# Patient Record
Sex: Female | Born: 1950 | Race: White | Hispanic: No | Marital: Married | Smoking: Never smoker
Health system: Southern US, Community
[De-identification: ages and names within clinical notes are randomized; demographics above are authoritative.]

## PROBLEM LIST (undated history)

## (undated) DIAGNOSIS — M5416 Radiculopathy, lumbar region: Secondary | ICD-10-CM

## (undated) DIAGNOSIS — M810 Age-related osteoporosis without current pathological fracture: Secondary | ICD-10-CM

## (undated) DIAGNOSIS — N390 Urinary tract infection, site not specified: Secondary | ICD-10-CM

## (undated) DIAGNOSIS — T84498A Other mechanical complication of other internal orthopedic devices, implants and grafts, initial encounter: Secondary | ICD-10-CM

## (undated) DIAGNOSIS — E785 Hyperlipidemia, unspecified: Secondary | ICD-10-CM

## (undated) DIAGNOSIS — Z889 Allergy status to unspecified drugs, medicaments and biological substances status: Secondary | ICD-10-CM

## (undated) DIAGNOSIS — D414 Neoplasm of uncertain behavior of bladder: Secondary | ICD-10-CM

## (undated) DIAGNOSIS — Z8739 Personal history of other diseases of the musculoskeletal system and connective tissue: Secondary | ICD-10-CM

## (undated) DIAGNOSIS — N6019 Diffuse cystic mastopathy of unspecified breast: Secondary | ICD-10-CM

## (undated) DIAGNOSIS — E7800 Pure hypercholesterolemia, unspecified: Secondary | ICD-10-CM

## (undated) DIAGNOSIS — N1831 Chronic kidney disease, stage 3a: Secondary | ICD-10-CM

## (undated) DIAGNOSIS — M1612 Unilateral primary osteoarthritis, left hip: Secondary | ICD-10-CM

## (undated) DIAGNOSIS — I499 Cardiac arrhythmia, unspecified: Secondary | ICD-10-CM

## (undated) DIAGNOSIS — M1990 Unspecified osteoarthritis, unspecified site: Secondary | ICD-10-CM

## (undated) DIAGNOSIS — R7303 Prediabetes: Secondary | ICD-10-CM

## (undated) DIAGNOSIS — M51369 Other intervertebral disc degeneration, lumbar region without mention of lumbar back pain or lower extremity pain: Secondary | ICD-10-CM

## (undated) DIAGNOSIS — G20A1 Parkinson's disease without dyskinesia, without mention of fluctuations: Secondary | ICD-10-CM

## (undated) HISTORY — PX: BLADDER FULGURATION: SHX1237

## (undated) HISTORY — PX: TUBAL LIGATION: SHX77

## (undated) HISTORY — PX: DILATION AND CURETTAGE OF UTERUS: SHX78

## (undated) HISTORY — PX: COLONOSCOPY: SHX174

## (undated) HISTORY — PX: OVARIAN CYST REMOVAL: SHX89

## (undated) HISTORY — PX: TONSILLECTOMY AND ADENOIDECTOMY: SUR1326

## (undated) HISTORY — PX: ABDOMINAL HYSTERECTOMY: SHX81

---

## 1976-10-11 HISTORY — PX: TONSILLECTOMY AND ADENOIDECTOMY: SUR1326

## 1983-10-12 HISTORY — PX: OVARIAN CYST REMOVAL: SHX89

## 1996-10-11 HISTORY — PX: DILATION AND CURETTAGE OF UTERUS: SHX78

## 2005-09-21 ENCOUNTER — Ambulatory Visit: Payer: Self-pay | Admitting: Internal Medicine

## 2006-12-14 ENCOUNTER — Ambulatory Visit: Payer: Self-pay | Admitting: Internal Medicine

## 2008-05-09 ENCOUNTER — Ambulatory Visit: Payer: Self-pay | Admitting: Internal Medicine

## 2009-05-14 ENCOUNTER — Ambulatory Visit: Payer: Self-pay | Admitting: Internal Medicine

## 2010-05-19 ENCOUNTER — Ambulatory Visit: Payer: Self-pay | Admitting: Internal Medicine

## 2010-09-29 ENCOUNTER — Ambulatory Visit: Payer: Self-pay | Admitting: Pain Medicine

## 2010-10-07 ENCOUNTER — Ambulatory Visit: Payer: Self-pay | Admitting: Pain Medicine

## 2010-10-27 ENCOUNTER — Ambulatory Visit: Payer: Self-pay | Admitting: Pain Medicine

## 2010-11-09 ENCOUNTER — Ambulatory Visit: Payer: Self-pay | Admitting: Pain Medicine

## 2010-12-15 ENCOUNTER — Ambulatory Visit: Payer: Self-pay | Admitting: Pain Medicine

## 2010-12-23 ENCOUNTER — Ambulatory Visit: Payer: Self-pay | Admitting: Pain Medicine

## 2011-01-26 ENCOUNTER — Ambulatory Visit: Payer: Self-pay | Admitting: Pain Medicine

## 2011-02-23 ENCOUNTER — Ambulatory Visit: Payer: Self-pay | Admitting: Pain Medicine

## 2011-03-10 ENCOUNTER — Ambulatory Visit: Payer: Self-pay | Admitting: Pain Medicine

## 2011-04-20 ENCOUNTER — Ambulatory Visit: Payer: Self-pay | Admitting: Pain Medicine

## 2011-04-26 ENCOUNTER — Ambulatory Visit: Payer: Self-pay | Admitting: Pain Medicine

## 2011-05-20 ENCOUNTER — Ambulatory Visit: Payer: Self-pay | Admitting: Pain Medicine

## 2011-05-25 ENCOUNTER — Ambulatory Visit: Payer: Self-pay | Admitting: Internal Medicine

## 2011-06-22 ENCOUNTER — Ambulatory Visit: Payer: Self-pay | Admitting: Pain Medicine

## 2011-08-17 ENCOUNTER — Ambulatory Visit: Payer: Self-pay | Admitting: Pain Medicine

## 2011-10-19 ENCOUNTER — Ambulatory Visit: Payer: Self-pay | Admitting: Pain Medicine

## 2012-05-30 ENCOUNTER — Ambulatory Visit: Payer: Self-pay | Admitting: Internal Medicine

## 2013-01-01 ENCOUNTER — Ambulatory Visit: Payer: Self-pay | Admitting: Gastroenterology

## 2013-01-01 HISTORY — PX: COLONOSCOPY: SHX174

## 2013-12-11 ENCOUNTER — Ambulatory Visit: Payer: Self-pay | Admitting: Internal Medicine

## 2014-01-16 DIAGNOSIS — I493 Ventricular premature depolarization: Secondary | ICD-10-CM | POA: Insufficient documentation

## 2014-11-12 DIAGNOSIS — M810 Age-related osteoporosis without current pathological fracture: Secondary | ICD-10-CM | POA: Insufficient documentation

## 2014-11-12 DIAGNOSIS — E785 Hyperlipidemia, unspecified: Secondary | ICD-10-CM | POA: Insufficient documentation

## 2014-12-16 ENCOUNTER — Ambulatory Visit: Payer: Self-pay | Admitting: Internal Medicine

## 2015-12-22 DIAGNOSIS — Z0000 Encounter for general adult medical examination without abnormal findings: Secondary | ICD-10-CM | POA: Insufficient documentation

## 2015-12-29 ENCOUNTER — Other Ambulatory Visit: Payer: Self-pay | Admitting: Internal Medicine

## 2015-12-29 DIAGNOSIS — Z1231 Encounter for screening mammogram for malignant neoplasm of breast: Secondary | ICD-10-CM

## 2016-01-16 ENCOUNTER — Ambulatory Visit
Admission: RE | Admit: 2016-01-16 | Discharge: 2016-01-16 | Disposition: A | Discharge status: Home / Self Care | Payer: Managed Care, Other (non HMO) | Source: Ambulatory Visit | Attending: Internal Medicine | Admitting: Internal Medicine

## 2016-01-16 DIAGNOSIS — Z1231 Encounter for screening mammogram for malignant neoplasm of breast: Secondary | ICD-10-CM | POA: Diagnosis present

## 2017-01-04 ENCOUNTER — Other Ambulatory Visit: Payer: Self-pay | Admitting: Internal Medicine

## 2017-01-04 DIAGNOSIS — Z1231 Encounter for screening mammogram for malignant neoplasm of breast: Secondary | ICD-10-CM

## 2017-01-25 ENCOUNTER — Ambulatory Visit
Admission: RE | Admit: 2017-01-25 | Discharge: 2017-01-25 | Disposition: A | Discharge status: Home / Self Care | Payer: Managed Care, Other (non HMO) | Source: Ambulatory Visit | Attending: Internal Medicine | Admitting: Internal Medicine

## 2017-01-25 DIAGNOSIS — Z1231 Encounter for screening mammogram for malignant neoplasm of breast: Secondary | ICD-10-CM | POA: Diagnosis present

## 2018-03-16 ENCOUNTER — Other Ambulatory Visit: Payer: Self-pay | Admitting: Internal Medicine

## 2018-03-16 DIAGNOSIS — Z1231 Encounter for screening mammogram for malignant neoplasm of breast: Secondary | ICD-10-CM

## 2018-04-10 ENCOUNTER — Ambulatory Visit
Admission: RE | Admit: 2018-04-10 | Discharge: 2018-04-10 | Disposition: A | Discharge status: Home / Self Care | Payer: Medicare HMO | Source: Ambulatory Visit | Attending: Internal Medicine | Admitting: Internal Medicine

## 2018-04-10 DIAGNOSIS — Z1231 Encounter for screening mammogram for malignant neoplasm of breast: Secondary | ICD-10-CM | POA: Diagnosis present

## 2018-07-11 ENCOUNTER — Encounter: Payer: Self-pay | Admitting: *Deleted

## 2018-07-12 ENCOUNTER — Ambulatory Visit
Admission: RE | Admit: 2018-07-12 | Discharge: 2018-07-12 | Disposition: A | Discharge status: Home / Self Care | Payer: Medicare HMO | Source: Ambulatory Visit | Attending: Internal Medicine | Admitting: Internal Medicine

## 2018-07-12 ENCOUNTER — Ambulatory Visit: Payer: Medicare HMO | Admitting: Anesthesiology

## 2018-07-12 ENCOUNTER — Other Ambulatory Visit: Payer: Self-pay

## 2018-07-12 ENCOUNTER — Encounter
Admission: RE | Disposition: A | Discharge status: Home / Self Care | Payer: Self-pay | Source: Ambulatory Visit | Attending: Internal Medicine

## 2018-07-12 DIAGNOSIS — M810 Age-related osteoporosis without current pathological fracture: Secondary | ICD-10-CM | POA: Insufficient documentation

## 2018-07-12 DIAGNOSIS — Z800 Family history of malignant neoplasm of digestive organs: Secondary | ICD-10-CM | POA: Diagnosis not present

## 2018-07-12 DIAGNOSIS — E7800 Pure hypercholesterolemia, unspecified: Secondary | ICD-10-CM | POA: Diagnosis not present

## 2018-07-12 DIAGNOSIS — K621 Rectal polyp: Secondary | ICD-10-CM | POA: Insufficient documentation

## 2018-07-12 DIAGNOSIS — K640 First degree hemorrhoids: Secondary | ICD-10-CM | POA: Diagnosis not present

## 2018-07-12 DIAGNOSIS — Z1211 Encounter for screening for malignant neoplasm of colon: Secondary | ICD-10-CM | POA: Diagnosis not present

## 2018-07-12 HISTORY — DX: Neoplasm of uncertain behavior of bladder: D41.4

## 2018-07-12 HISTORY — DX: Urinary tract infection, site not specified: N39.0

## 2018-07-12 HISTORY — DX: Diffuse cystic mastopathy of unspecified breast: N60.19

## 2018-07-12 HISTORY — PX: COLONOSCOPY WITH PROPOFOL: SHX5780

## 2018-07-12 HISTORY — DX: Personal history of other diseases of the musculoskeletal system and connective tissue: Z87.39

## 2018-07-12 HISTORY — DX: Allergy status to unspecified drugs, medicaments and biological substances: Z88.9

## 2018-07-12 HISTORY — DX: Age-related osteoporosis without current pathological fracture: M81.0

## 2018-07-12 HISTORY — DX: Cardiac arrhythmia, unspecified: I49.9

## 2018-07-12 HISTORY — DX: Pure hypercholesterolemia, unspecified: E78.00

## 2018-07-12 SURGERY — COLONOSCOPY WITH PROPOFOL
Anesthesia: General

## 2018-07-12 MED ORDER — LIDOCAINE HCL (PF) 2 % IJ SOLN
INTRAMUSCULAR | Status: AC
  Filled 2018-07-12: qty 10

## 2018-07-12 MED ORDER — PROPOFOL 500 MG/50ML IV EMUL
INTRAVENOUS | Status: AC
  Filled 2018-07-12: qty 50

## 2018-07-12 MED ORDER — LIDOCAINE HCL (CARDIAC) PF 100 MG/5ML IV SOSY
PREFILLED_SYRINGE | INTRAVENOUS | Status: DC | PRN
  Administered 2018-07-12: 100 mg via INTRAVENOUS

## 2018-07-12 MED ORDER — PROPOFOL 10 MG/ML IV BOLUS
INTRAVENOUS | Status: DC | PRN
  Administered 2018-07-12: 80 mg via INTRAVENOUS

## 2018-07-12 MED ORDER — SODIUM CHLORIDE 0.9 % IV SOLN
INTRAVENOUS | Status: DC
  Administered 2018-07-12: 10:00:00 via INTRAVENOUS

## 2018-07-12 MED ORDER — PROPOFOL 500 MG/50ML IV EMUL
INTRAVENOUS | Status: DC | PRN
  Administered 2018-07-12: 150 ug/kg/min via INTRAVENOUS

## 2018-07-12 NOTE — Interval H&P Note (Signed)
History and Physical Interval Note:  07/12/2018 11:03 AM  Lindsay Fields  has presented today for surgery, with the diagnosis of [facility]  The various methods of treatment have been discussed with the patient and family. After consideration of risks, benefits and other options for treatment, the patient has consented to  Procedure(s): COLONOSCOPY WITH PROPOFOL (N/A) as a surgical intervention .  The patient's history has been reviewed, patient examined, no change in status, stable for surgery.  I have reviewed the patient's chart and labs.  Questions were answered to the patient's satisfaction.     [place], [place]

## 2018-07-12 NOTE — Anesthesia Post-op Follow-up Note (Signed)
Anesthesia QCDR form completed.        

## 2018-07-12 NOTE — H&P (Signed)
Outpatient short stay form Pre-procedure 07/12/2018 11:02 AM Lo[place]on Leon K. Alice Reichert, M.D.  Primary Physician: Frazier Richards, M.D.  Reason for visit:  Family history of colon cancer (brother)  History of present illness:  Patient presents for colonoscopy for increased risk colon cancer screening due to family history of colon cancer in a first degree relative (Brother). The patient denies complaints of abdominal pain, significant change in bowel habits, or rectal bleeding.     Current Facility-Administered Medications:  .  0.9 %  sodium chloride infusion, , Intravenous, Continuous, Bruneau, [name], MD, Last Rate: 20 mL/hr at 07/12/18 1010  Medications Prior to Admission  Medication Sig Dispense Refill Last Dose  . bisoprolol (ZEBETA) 5 MG tablet Take 5 mg by mouth daily.   07/11/2018 at Unknown time  . levocetirizine (XYZAL) 5 MG tablet Take 5 mg by mouth every evening.   07/11/2018 at Unknown time  . raloxifene (EVISTA) 60 MG tablet Take 60 mg by mouth daily.   07/11/2018 at Unknown time  . topiramate (TOPAMAX) 25 MG tablet Take 25 mg by mouth daily.   07/11/2018 at Unknown time     No Known Allergies   Past Medical History:  Diagnosis Date  . Allergic genetic state   . Bladder polyps   . Dysrhythmia    PVC's  . Fibrocystic breast disease   . Frequent UTI   . H/O degenerative disc disease   . Hypercholesterolemia   . Osteoporosis     Review of systems:  Otherwise negative.    Physical Exam  Gen: Alert, oriented. Appears stated age.  HEENT: [place]/AT. PERRLA. Lungs: CTA, no wheezes. CV: RR nl S1, S2. Abd: soft, benign, no masses. BS+ Ext: No edema. Pulses 2+    Planned procedures: Proceed with colonoscopy. The patient understands the nature of the planned procedure, indications, risks, alternatives and potential complications including but not limited to bleeding, infection, perforation, damage to internal organs and possible oversedation/side effects from anesthesia. The  patient agrees and gives consent to proceed.  Please refer to procedure notes for findings, recommendations and patient disposition/instructions.     Ishmael Berkovich K. Alice Reichert, M.D. Gastroenterology 07/12/2018  11:02 AM

## 2018-07-12 NOTE — Transfer of Care (Signed)
Immediate Anesthesia Transfer of Care Note  Patient: Lindsay Fields  Procedure(s) Performed: COLONOSCOPY WITH PROPOFOL (N/A )  Patient Location: Endoscopy Unit  Anesthesia Type:General  Level of Consciousness: sedated  Airway & Oxygen Therapy: Patient Spontanous Breathing and Patient connected to nasal cannula oxygen  Post-op Assessment: Report given to RN and Post -op Vital signs reviewed and stable  Post vital signs: Reviewed and stable  Last Vitals:  Vitals Value Taken Time  BP    Temp    Pulse    Resp    SpO2      Last Pain:  Vitals:   07/12/18 0954  TempSrc: Tympanic  PainSc: 0-No pain         Complications: No apparent anesthesia complications

## 2018-07-12 NOTE — Anesthesia Preprocedure Evaluation (Signed)
Anesthesia Evaluation  Patient identified by MRN, date of birth, ID band Patient awake    Reviewed: Allergy & Precautions, H&P , NPO status , Patient's Chart, lab work & pertinent test results, reviewed documented beta blocker date and time   History of Anesthesia Complications Negative for: history of anesthetic complications  Airway Mallampati: I  TM Distance: >3 FB Neck ROM: full    Dental  (+) Dental Advidsory Given, Missing, Teeth Intact   Pulmonary neg pulmonary ROS,           Cardiovascular Exercise Tolerance: Good (-) hypertension(-) angina(-) CAD, (-) Past MI, (-) Cardiac Stents and (-) CABG negative cardio ROS  + dysrhythmias (PVCs) (-) Valvular Problems/Murmurs     Neuro/Psych negative neurological ROS  negative psych ROS   GI/Hepatic negative GI ROS, Neg liver ROS,   Endo/Other  negative endocrine ROS  Renal/GU negative Renal ROS  negative genitourinary   Musculoskeletal   Abdominal   Peds  Hematology negative hematology ROS (+)   Anesthesia Other Findings 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 No date: Bladder polyps No date: Dysrhythmia     Comment:  PVC's No date: Fibrocystic breast disease No date: Frequent UTI No date: H/O degenerative disc disease No date: Hypercholesterolemia No date: Osteoporosis   Reproductive/Obstetrics negative OB ROS                             Anesthesia Physical Anesthesia Plan  ASA: II  Anesthesia Plan: General   Post-op Pain Management:    Induction: Intravenous  PONV Risk Score and Plan: 3 and Propofol infusion and TIVA  Airway Management Planned: Nasal Cannula and Natural Airway  Additional Equipment:   Intra-op Plan:   Post-operative Plan:   Informed Consent: I have reviewed the patients History and Physical, chart, labs and discussed the procedure including the risks, benefits and alternatives for the  proposed anesthesia with the patient or authorized representative who has indicated his/her understanding and acceptance.   Dental Advisory Given  Plan Discussed with: Anesthesiologist, CRNA and Surgeon  Anesthesia Plan Comments:         Anesthesia Quick Evaluation

## 2018-07-12 NOTE — Op Note (Signed)
[facility] Gastroenterology Patient Name: Lindsay Fields Procedure Date: 07/12/2018 11:04 AM MRN: [id number] Account #: [id number] Date of Birth: 07-Jul-1951 Admit Type: Outpatient Age: 67 Room: [facility] ENDO ROOM 4 Gender: Female Note Status: Finalized Procedure:            Colonoscopy Indications:          Screening in patient at increased risk: Family history                        of 1st-degree relative with colorectal cancer before                        age 94 years Providers:            [name]. Alice Reichert MD, MD Referring MD:         Ocie Cornfield. Ouida Sills MD, MD (Referring MD) Medicines:            Propofol per Anesthesia Complications:        No immediate complications. Procedure:            Pre-Anesthesia Assessment:                       - The risks and benefits of the procedure and the                        sedation options and risks were discussed with the                        patient. All questions were answered and informed                        consent was obtained.                       - Patient identification and proposed procedure were                        verified prior to the procedure by the nurse. The                        procedure was verified in the procedure room.                       - ASA Grade Assessment: III - A patient with severe                        systemic disease.                       - After reviewing the risks and benefits, the patient                        was deemed in satisfactory condition to undergo the                        procedure.                       After obtaining informed consent, the colonoscope was  passed under direct vision. Throughout the procedure,                        the patient's blood pressure, pulse, and oxygen                        saturations were monitored continuously. The                        Colonoscope was introduced through the anus and   advanced to the the cecum, identified by appendiceal                        orifice and ileocecal valve. The colonoscopy was                        performed without difficulty. The patient tolerated the                        procedure well. The quality of the bowel preparation                        was excellent. The ileocecal valve, appendiceal                        orifice, and rectum were photographed. Findings:      The perianal and digital rectal examinations were normal. Pertinent       negatives include normal sphincter tone and no palpable rectal lesions.      A 10 mm polyp was found in the rectum. The polyp was carpet-like. The       polyp was removed with a hot snare. Resection and retrieval were       complete.      Non-bleeding internal hemorrhoids were found during retroflexion. The       hemorrhoids were Grade I (internal hemorrhoids that do not prolapse).      The exam was otherwise without abnormality. Impression:           - One 10 mm polyp in the rectum, removed with a hot                        snare. Resected and retrieved.                       - Non-bleeding internal hemorrhoids.                       - The examination was otherwise normal. Recommendation:       - Patient has a contact number available for                        emergencies. The signs and symptoms of potential                        delayed complications were discussed with the patient.                        Return to normal activities tomorrow. Written discharge                        instructions  were provided to the patient.                       - Resume previous diet.                       - Continue present medications.                       - Await pathology results.                       - Repeat colonoscopy is recommended for surveillance.                        The colonoscopy date will be determined after pathology                        results from today's exam become available for  review.                       - Return to GI office PRN.                       - The findings and recommendations were discussed with                        the patient and their spouse. Procedure Code(s):    --- Professional ---                       [id number], Colonoscopy, flexible; with removal of tumor(s),                        polyp(s), or other lesion(s) by snare technique Diagnosis Code(s):    --- Professional ---                       K64.0, First degree hemorrhoids                       K62.1, Rectal polyp                       Z80.0, Family history of malignant neoplasm of                        digestive organs CPT copyright 2017 American Medical Association. All rights reserved. The codes documented in this report are preliminary and upon coder review may  be revised to meet current compliance requirements. Efrain Sella MD, MD 07/12/2018 11:42:49 AM This report has been signed electronically. Number of Addenda: 0 Note Initiated On: 07/12/2018 11:04 AM Scope Withdrawal Time: 0 hours 7 minutes 57 seconds  Total Procedure Duration: 0 hours 14 minutes 41 seconds       [facility]

## 2018-07-13 ENCOUNTER — Encounter: Payer: Self-pay | Admitting: Internal Medicine

## 2018-07-13 NOTE — Anesthesia Postprocedure Evaluation (Signed)
Anesthesia Post Note  Patient: [name]  Procedure(s) Performed: COLONOSCOPY WITH PROPOFOL (N/A )  Patient location during evaluation: Endoscopy Anesthesia Type: General Level of consciousness: awake and alert Pain management: pain level controlled Vital Signs Assessment: post-procedure vital signs reviewed and stable Respiratory status: spontaneous breathing, nonlabored ventilation, respiratory function stable and patient connected to nasal cannula oxygen Cardiovascular status: blood pressure returned to baseline and stable Postop Assessment: no apparent nausea or vomiting Anesthetic complications: no     Last Vitals:  Vitals:   07/12/18 1143 07/12/18 1203  BP: 107/65 127/69  Pulse: 64 (!) 56  Resp: 18 16  Temp: (!) 36.2 C   SpO2: 99% 98%    Last Pain:  Vitals:   07/13/18 0715  TempSrc:   PainSc: 0-No pain                 Martha Clan

## 2018-07-14 LAB — SURGICAL PATHOLOGY

## 2019-03-27 ENCOUNTER — Other Ambulatory Visit: Payer: Self-pay | Admitting: Internal Medicine

## 2019-03-27 DIAGNOSIS — Z1231 Encounter for screening mammogram for malignant neoplasm of breast: Secondary | ICD-10-CM

## 2019-05-03 ENCOUNTER — Ambulatory Visit
Admission: RE | Admit: 2019-05-03 | Discharge: 2019-05-03 | Disposition: A | Discharge status: Home / Self Care | Payer: Medicare HMO | Source: Ambulatory Visit | Attending: Internal Medicine | Admitting: Internal Medicine

## 2019-05-03 DIAGNOSIS — Z1231 Encounter for screening mammogram for malignant neoplasm of breast: Secondary | ICD-10-CM

## 2020-05-15 ENCOUNTER — Other Ambulatory Visit: Payer: Self-pay | Admitting: Internal Medicine

## 2020-05-15 DIAGNOSIS — Z1231 Encounter for screening mammogram for malignant neoplasm of breast: Secondary | ICD-10-CM

## 2020-05-26 ENCOUNTER — Other Ambulatory Visit: Payer: Self-pay | Admitting: Rheumatology

## 2020-05-26 DIAGNOSIS — M7062 Trochanteric bursitis, left hip: Secondary | ICD-10-CM

## 2020-05-26 DIAGNOSIS — G8929 Other chronic pain: Secondary | ICD-10-CM

## 2020-06-04 ENCOUNTER — Other Ambulatory Visit: Payer: Self-pay

## 2020-06-04 ENCOUNTER — Ambulatory Visit
Admission: RE | Admit: 2020-06-04 | Discharge: 2020-06-04 | Disposition: A | Discharge status: Home / Self Care | Payer: Medicare HMO | Source: Ambulatory Visit | Attending: Internal Medicine | Admitting: Internal Medicine

## 2020-06-04 DIAGNOSIS — Z1231 Encounter for screening mammogram for malignant neoplasm of breast: Secondary | ICD-10-CM | POA: Insufficient documentation

## 2020-06-06 ENCOUNTER — Other Ambulatory Visit: Payer: Self-pay | Admitting: Internal Medicine

## 2020-06-06 DIAGNOSIS — N6459 Other signs and symptoms in breast: Secondary | ICD-10-CM

## 2020-06-06 DIAGNOSIS — N6489 Other specified disorders of breast: Secondary | ICD-10-CM

## 2020-06-12 ENCOUNTER — Ambulatory Visit
Admission: RE | Admit: 2020-06-12 | Discharge: 2020-06-12 | Disposition: A | Discharge status: Home / Self Care | Payer: Medicare HMO | Source: Ambulatory Visit | Attending: Rheumatology | Admitting: Rheumatology

## 2020-06-12 ENCOUNTER — Other Ambulatory Visit: Payer: Self-pay

## 2020-06-12 DIAGNOSIS — G8929 Other chronic pain: Secondary | ICD-10-CM

## 2020-06-12 DIAGNOSIS — M7062 Trochanteric bursitis, left hip: Secondary | ICD-10-CM

## 2020-06-12 DIAGNOSIS — M545 Low back pain: Secondary | ICD-10-CM | POA: Insufficient documentation

## 2020-06-19 ENCOUNTER — Ambulatory Visit
Admission: RE | Admit: 2020-06-19 | Discharge: 2020-06-19 | Disposition: A | Discharge status: Home / Self Care | Payer: Medicare HMO | Source: Ambulatory Visit | Attending: Internal Medicine | Admitting: Internal Medicine

## 2020-06-19 DIAGNOSIS — N6489 Other specified disorders of breast: Secondary | ICD-10-CM | POA: Diagnosis present

## 2020-06-19 DIAGNOSIS — N6459 Other signs and symptoms in breast: Secondary | ICD-10-CM | POA: Diagnosis present

## 2020-07-22 ENCOUNTER — Ambulatory Visit: Payer: Medicare HMO | Attending: Family Medicine

## 2020-07-22 ENCOUNTER — Other Ambulatory Visit: Payer: Self-pay

## 2020-07-22 DIAGNOSIS — G8929 Other chronic pain: Secondary | ICD-10-CM | POA: Insufficient documentation

## 2020-07-22 DIAGNOSIS — M6281 Muscle weakness (generalized): Secondary | ICD-10-CM

## 2020-07-22 DIAGNOSIS — M545 Low back pain, unspecified: Secondary | ICD-10-CM | POA: Insufficient documentation

## 2020-07-22 NOTE — Therapy (Signed)
[facility] [address] [place], [address] Phone: [phone]   Fax:  [phone]  Physical Therapy Evaluation  Patient Details  Name: Lindsay Fields MRN: [id number] Date of Birth: Mar 29, 1951 Referring Provider (PT): Harvest Dark, FNP   Encounter Date: 07/22/2020   PT End of Session - 07/22/20 1353    Visit Number 1    Number of Visits 13    Date for PT Re-Evaluation 09/02/20    Authorization Type 1/10    PT Start Time 7628    PT Stop Time 1400    PT Time Calculation (min) 53 min    Activity Tolerance Patient tolerated treatment well;No increased pain    Behavior During Therapy WFL for tasks assessed/performed           Past Medical History:  Diagnosis Date  . Allergic genetic state   . Bladder polyps   . Dysrhythmia    PVC's  . Fibrocystic breast disease   . Frequent UTI   . H/O degenerative disc disease   . Hypercholesterolemia   . Osteoporosis     Past Surgical History:  Procedure Laterality Date  . ABDOMINAL HYSTERECTOMY    . BLADDER FULGURATION    . COLONOSCOPY    . COLONOSCOPY WITH PROPOFOL N/A 07/12/2018   Procedure: COLONOSCOPY WITH PROPOFOL;  Surgeon: Toledo, [name], MD;  Location: [facility] ENDOSCOPY;  Service: Gastroenterology;  Laterality: N/A;  . DILATION AND CURETTAGE OF UTERUS    . OVARIAN CYST REMOVAL    . TONSILLECTOMY AND ADENOIDECTOMY      There were no vitals filed for this visit.    Subjective Assessment - 07/22/20 1319    Subjective Patient is a 69 y/o female with a diagnosis of DDD with a c/c of intermittent low back pain that has been occurring for around a year. Patient has a history of chronic hip bursitis and low back pain. Patient states that she is has difficulty with lifting and standing with lifting causing the most pain (6-7/10 NPS).  Patient states that relief comes from walking, sitting with a heating pad, and taking medication (Celebrex).  Patient  states that her LBP gradually progresses throughout the day.  Patient reports no symptoms that radiate down her legs. Patient states that her goals is to lessen/eliminate LBP and be able to lift without pain flaring up.    Pertinent History Chronic Hip Bursitis, Arthiritis, Osteoporosis    Limitations Lifting;Standing    How long can you sit comfortably? Unlimited    How long can you stand comfortably? ~15 minutes    How long can you walk comfortably? unlimited    Diagnostic tests MRI of Low back: DDD    Patient Stated Goals To be able to lift objects/grandchildren without having pain    Currently in Pain? Yes    Pain Score 3     Pain Location Back              [facility] PT Assessment - 07/22/20 0001      Assessment   Medical Diagnosis Degenerative Disc Disease     Referring Provider (PT) Whitney L Meeler, FNP    Hand Dominance Right    Prior Therapy Yes      Balance Screen   Has the patient fallen in the past 6 months No    Has the patient had a decrease in activity level because of a fear of falling?  Yes    Is  the patient reluctant to leave their home because of a fear of falling?  No      Prior Function   Level of Independence Independent    Vocation Retired      Observation/Other Assessments   Focus on Therapeutic Outcomes (FOTO)  49      ROM / Strength   AROM / PROM / Strength AROM;Strength      AROM   Overall AROM  Within functional limits for tasks performed      Strength   Strength Assessment Site Hip;Knee;Ankle    Right/Left Hip Right;Left    Right Hip Flexion 4-/5    Right Hip Extension 3+/5    Right Hip External Rotation  4-/5    Right Hip Internal Rotation 4-/5    Right Hip ABduction 3+/5    Right Hip ADduction 4-/5    Left Hip Flexion 4-/5    Left Hip Extension 3+/5    Left Hip External Rotation 4-/5    Left Hip Internal Rotation 4-/5    Left Hip ABduction 3+/5    Left Hip ADduction 4-/5    Right/Left Knee Right;Left    Right Knee Flexion 4-/5     Right Knee Extension 4/5    Left Knee Flexion 4-/5    Left Knee Extension 4/5    Right/Left Ankle Right;Left    Right Ankle Dorsiflexion 4+/5    Left Ankle Dorsiflexion 4+/5      Palpation   SI assessment  Pain with Sacral and Thigh Thrust    Palpation comment tender to PSIS bilat.      Special Tests    Special Tests Lumbar;Sacrolliac Tests    Sacroiliac Tests  Gaenslen's Test      Pelvic Dictraction   Findings Negative      Pelvic Compression   Findings Negative      Sacral thrust    Findings Positive    Comments Bilat.      Gaenslen's test   Findings Negative      Sacral Compression   Findings Positive      Transfers   Five time sit to stand comments  16sec      Ambulation/Gait   Ambulation/Gait Yes    Ambulation Distance (Feet) 100 Feet    Gait Comments Trendelenburg Present       Balance   Balance Assessed Yes   Tandem and SLS          Therapeutic Exercise  - SLR x10 - Standing Hip Abduction x10 B - Standing Hip Extension x10  - STS x10            Objective measurements completed on examination: See above findings.               PT Education - 07/22/20 1312    Education Details Educated on HEP and POC    Person(s) Educated Patient    Methods Explanation;Demonstration    Comprehension Verbalized understanding;Returned demonstration            PT Short Term Goals - 07/22/20 1356      PT SHORT TERM GOAL #1   Title Patient will demonstrate independence with HEP to maximize rehab potential.    Time 3    Period Weeks    Status New    Target Date 08/12/20             PT Long Term Goals - 07/22/20 1357      PT LONG TERM GOAL #1  Title Patient will demonstrate independence with progressive HEP to maintain progress made during POC.    Time 6    Period Weeks    Status New    Target Date 09/02/20      PT LONG TERM GOAL #2   Title Patient will increase FOTO score to 60 to show an increase in patients ability to  perform functional activities without current limitations.    Baseline 07/22/20: 49    Time 6    Period Weeks    Status New    Target Date 09/02/20      PT LONG TERM GOAL #3   Title Patient will be able to lift objects/grandchildren up without pain to show an improvement in pain and strength of lower back.    Baseline Cannot Lift without pain increasing    Time 6    Period Weeks    Status New    Target Date 09/02/20      PT LONG TERM GOAL #4   Title Patient will be able to cook for a few hours without pain to show an improvement in pain and strength of lower back.    Baseline Can only stand~1- 2 hours    Time 6    Period Weeks    Status New    Target Date 09/02/20      PT LONG TERM GOAL #5   Title Patient will improve 5xSTS to 12 seconds or less to show improvement in strength, power, and falls risk.    Baseline 07/31/20: 16sec    Time 6    Period Weeks    Status New    Target Date 09/02/20                  Plan - 07/22/20 1401    Clinical Impression Statement Patient is a 69 y/o female with a diagnosis of DDD with a c/c of intermittent low back pain that has been occurring for around a year. Upon evaluation, patient presented with moderate LE weakness along with tenderness to bilat. PSIS and SIJ.  Patient's gait displayed Trendelenburg along with only being able to maintain SLS for ~3 seconds bilat. corresponding with results of her hip musculature MMT. Patient had a positive sacral and thigh thrust with negative results for the remaining tests for SIJ cluster which does not indicate SIJ dysfunction. Patient will benefit from skilled therapy to improve low back and LE strength along with balance to progress towards goals and return to prior level of function.    Personal Factors and Comorbidities Age;Comorbidity 3+    Comorbidities Osteoporosis, Athritis, Chronic LBP/Hip Bursitis    Examination-Activity Limitations Stand    Examination-Participation Restrictions  Community Activity    Stability/Clinical Decision Making Stable/Uncomplicated    Clinical Decision Making Low    Rehab Potential Good    PT Frequency 2x / week    PT Duration 6 weeks    PT Treatment/Interventions ADLs/Self Care Home Management;Biofeedback;Electrical Stimulation;Moist Heat;Traction;DME Instruction;Gait training;Stair training;Functional mobility training;Therapeutic activities;Therapeutic exercise;Balance training;Neuromuscular re-education;Patient/family education;Manual techniques;Passive range of motion;Dry needling;Energy conservation;Spinal Manipulations;Joint Manipulations    PT Next Visit Plan Review HEP and begin Strengthening    PT Home Exercise Plan SLR, Standing Hip Abduction and Extension    Consulted and Agree with Plan of Care Patient           Patient will benefit from skilled therapeutic intervention in order to improve the following deficits and impairments:  Abnormal gait, Decreased balance, Decreased endurance, Difficulty walking, Increased muscle  spasms, Decreased range of motion, Impaired perceived functional ability, Improper body mechanics, Decreased activity tolerance, Decreased coordination, Decreased strength, Pain, Postural dysfunction  Visit Diagnosis: Chronic bilateral low back pain without sciatica  Muscle weakness (generalized)     Problem List There are no problems to display for this patient.  3:48 PM, 07/22/20 Margarito Liner, SPT Student Physical Therapist [place]  [phone]  Margarito Liner 07/22/2020, 3:48 PM  [facility] [address] [place], [address] Phone: [phone]   Fax:  [phone]  Name: NORRINE BALLESTER MRN: [id number] Date of Birth: 05-09-1951

## 2020-07-24 ENCOUNTER — Ambulatory Visit: Payer: Medicare HMO

## 2020-07-24 ENCOUNTER — Other Ambulatory Visit: Payer: Self-pay

## 2020-07-24 DIAGNOSIS — G8929 Other chronic pain: Secondary | ICD-10-CM

## 2020-07-24 DIAGNOSIS — M545 Low back pain, unspecified: Secondary | ICD-10-CM | POA: Diagnosis not present

## 2020-07-24 DIAGNOSIS — M6281 Muscle weakness (generalized): Secondary | ICD-10-CM

## 2020-07-24 NOTE — Therapy (Signed)
[facility] [address] [place], [address] Phone: [phone]   Fax:  [phone]  Physical Therapy Treatment  Patient Details  Name: Lindsay Fields MRN: [id number] Date of Birth: 1950/10/31 Referring Provider (PT): Harvest Dark, FNP   Encounter Date: 07/24/2020   PT End of Session - 07/24/20 1337    Visit Number 2    Number of Visits 13    Date for PT Re-Evaluation 09/02/20    Authorization Type 2/10    PT Start Time 1330    PT Stop Time 1415    PT Time Calculation (min) 45 min    Activity Tolerance Patient tolerated treatment well;No increased pain    Behavior During Therapy WFL for tasks assessed/performed           Past Medical History:  Diagnosis Date  . Allergic genetic state   . Bladder polyps   . Dysrhythmia    PVC's  . Fibrocystic breast disease   . Frequent UTI   . H/O degenerative disc disease   . Hypercholesterolemia   . Osteoporosis     Past Surgical History:  Procedure Laterality Date  . ABDOMINAL HYSTERECTOMY    . BLADDER FULGURATION    . COLONOSCOPY    . COLONOSCOPY WITH PROPOFOL N/A 07/12/2018   Procedure: COLONOSCOPY WITH PROPOFOL;  Surgeon: Toledo, [name], MD;  Location: [facility]COPY;  Service: Gastroenterology;  Laterality: N/A;  . DILATION AND CURETTAGE OF UTERUS    . OVARIAN CYST REMOVAL    . TONSILLECTOMY AND ADENOIDECTOMY      There were no vitals filed for this visit.   Subjective Assessment - 07/24/20 1334    Subjective Patient states she took celebrex before today's session and states her pain is around a 2/10.    Pertinent History Chronic Hip Bursitis, Arthiritis, Osteoporosis    Limitations Lifting;Standing    How long can you sit comfortably? Unlimited    How long can you stand comfortably? ~15 minutes    How long can you walk comfortably? unlimited    Diagnostic tests MRI of Low back: DDD    Patient Stated Goals To be able to lift  objects/grandchildren without having pain    Currently in Pain? Yes    Pain Score 2     Pain Location Back    Pain Orientation Lower    Pain Descriptors / Indicators Aching    Pain Type Chronic pain              TREATMENT Therapeutic Exercise 2 x 10 sit to stands with arms extended 2 x 22ft Monster walks  2 x 10 deadlifts 10# , #20 2 x 15 kb swings 20#  60sec x 2 SLS with UE Back extension on wall -- 2 x 10   Performed exercises to decrease increased spasms and pain      PT Education - 07/24/20 1337    Education Details form/technique with exercise    Person(s) Educated Patient    Methods Explanation;Demonstration    Comprehension Verbalized understanding;Returned demonstration            PT Short Term Goals - 07/22/20 1356      PT SHORT TERM GOAL #1   Title Patient will demonstrate independence with HEP to maximize rehab potential.    Time 3    Period Weeks    Status New    Target Date 08/12/20  PT Long Term Goals - 07/22/20 1357      PT LONG TERM GOAL #1   Title Patient will demonstrate independence with progressive HEP to maintain progress made during POC.    Time 6    Period Weeks    Status New    Target Date 09/02/20      PT LONG TERM GOAL #2   Title Patient will increase FOTO score to 60 to show an increase in patients ability to perform functional activities without current limitations.    Baseline 07/22/20: 49    Time 6    Period Weeks    Status New    Target Date 09/02/20      PT LONG TERM GOAL #3   Title Patient will be able to lift objects/grandchildren up without pain to show an improvement in pain and strength of lower back.    Baseline Cannot Lift without pain increasing    Time 6    Period Weeks    Status New    Target Date 09/02/20      PT LONG TERM GOAL #4   Title Patient will be able to cook for a few hours without pain to show an improvement in pain and strength of lower back.    Baseline Can only stand~1- 2  hours    Time 6    Period Weeks    Status New    Target Date 09/02/20      PT LONG TERM GOAL #5   Title Patient will improve 5xSTS to 12 seconds or less to show improvement in strength, power, and falls risk.    Baseline 07/31/20: 16sec    Time 6    Period Weeks    Status New    Target Date 09/02/20                 Plan - 07/24/20 1340    Clinical Impression Statement Continued to focus on improving glute and hip strength to greater stabilize the lumbar spine. Patient faitgues quickly with performance of hip stbailization exercises indiating dysfunction. Patient will benefit from further skilled therapy to return to prior level of function.    Personal Factors and Comorbidities Age;Comorbidity 3+    Comorbidities Osteoporosis, Athritis, Chronic LBP/Hip Bursitis    Examination-Activity Limitations Stand    Examination-Participation Restrictions Community Activity    Stability/Clinical Decision Making Stable/Uncomplicated    Rehab Potential Good    PT Frequency 2x / week    PT Duration 6 weeks    PT Treatment/Interventions ADLs/Self Care Home Management;Biofeedback;Electrical Stimulation;Moist Heat;Traction;DME Instruction;Gait training;Stair training;Functional mobility training;Therapeutic activities;Therapeutic exercise;Balance training;Neuromuscular re-education;Patient/family education;Manual techniques;Passive range of motion;Dry needling;Energy conservation;Spinal Manipulations;Joint Manipulations    PT Next Visit Plan Review HEP and begin Strengthening    PT Home Exercise Plan SLR, Standing Hip Abduction and Extension    Consulted and Agree with Plan of Care Patient           Patient will benefit from skilled therapeutic intervention in order to improve the following deficits and impairments:  Abnormal gait, Decreased balance, Decreased endurance, Difficulty walking, Increased muscle spasms, Decreased range of motion, Impaired perceived functional ability, Improper  body mechanics, Decreased activity tolerance, Decreased coordination, Decreased strength, Pain, Postural dysfunction  Visit Diagnosis: Chronic bilateral low back pain without sciatica  Muscle weakness (generalized)     Problem List There are no problems to display for this patient.   Blythe Stanford, PT DPT 07/24/2020, 1:57 PM  [facility] [address] [place], [address] Phone: [phone]   Fax:  [phone]  Name: MASHAL SLAVICK MRN: [id number] Date of Birth: [birth year]

## 2020-07-29 ENCOUNTER — Other Ambulatory Visit: Payer: Self-pay

## 2020-07-29 ENCOUNTER — Ambulatory Visit: Payer: Medicare HMO

## 2020-07-29 DIAGNOSIS — M6281 Muscle weakness (generalized): Secondary | ICD-10-CM

## 2020-07-29 DIAGNOSIS — M545 Low back pain, unspecified: Secondary | ICD-10-CM | POA: Diagnosis not present

## 2020-07-29 DIAGNOSIS — G8929 Other chronic pain: Secondary | ICD-10-CM

## 2020-07-29 NOTE — Therapy (Signed)
[facility] [address], [place], [address] Phone: [phone]   Fax:  [phone]  Physical Therapy Treatment  Patient Details  Name: Lindsay Fields MRN: [id number] Date of Birth: 1951-09-02 Referring Provider (PT): Harvest Dark, FNP   Encounter Date: 07/29/2020   PT End of Session - 07/29/20 1348    Visit Number 3    Number of Visits 13    Date for PT Re-Evaluation 09/02/20    Authorization Type 3/10    PT Start Time 9373    PT Stop Time 1430    PT Time Calculation (min) 45 min    Activity Tolerance Patient tolerated treatment well;No increased pain    Behavior During Therapy WFL for tasks assessed/performed           Past Medical History:  Diagnosis Date  . Allergic genetic state   . Bladder polyps   . Dysrhythmia    PVC's  . Fibrocystic breast disease   . Frequent UTI   . H/O degenerative disc disease   . Hypercholesterolemia   . Osteoporosis     Past Surgical History:  Procedure Laterality Date  . ABDOMINAL HYSTERECTOMY    . BLADDER FULGURATION    . COLONOSCOPY    . COLONOSCOPY WITH PROPOFOL N/A 07/12/2018   Procedure: COLONOSCOPY WITH PROPOFOL;  Surgeon: Toledo, [name], MD;  Location: [facility];  Service: Gastroenterology;  Laterality: N/A;  . DILATION AND CURETTAGE OF UTERUS    . OVARIAN CYST REMOVAL    . TONSILLECTOMY AND ADENOIDECTOMY      There were no vitals filed for this visit.   Subjective Assessment - 07/29/20 1346    Subjective Patient reports she has pain in her L side of low back that flare up.    Pertinent History Chronic Hip Bursitis, Arthiritis, Osteoporosis    Limitations Lifting;Standing    How long can you sit comfortably? Unlimited    How long can you stand comfortably? ~15 minutes    How long can you walk comfortably? unlimited    Diagnostic tests MRI of Low back: DDD    Patient Stated Goals To be able to lift objects/grandchildren without having  pain    Currently in Pain? Yes    Pain Score 2     Pain Location Back           Interventions   Therapeutic Exercise Standing Hip Extension YTB 2x15  2 x 15 sit to stands 10# Step-Ups 8inch 2x10 B  2x10 B Monster walks YTB 2 x 15 deadlifts 10#  2 x 15 kb swings 20#  60sec x 2 SLS with UE   Performed exercises to decrease increased spasms and pain    PT Education - 07/29/20 1347    Education Details Form/Technique with Exercise    Person(s) Educated Patient    Methods Explanation;Demonstration    Comprehension Verbalized understanding;Returned demonstration            PT Short Term Goals - 07/22/20 1356      PT SHORT TERM GOAL #1   Title Patient will demonstrate independence with HEP to maximize rehab potential.    Time 3    Period Weeks    Status New    Target Date 08/12/20             PT Long Term Goals - 07/22/20 1357      PT LONG TERM GOAL #1   Title Patient will demonstrate  independence with progressive HEP to maintain progress made during POC.    Time 6    Period Weeks    Status New    Target Date 09/02/20      PT LONG TERM GOAL #2   Title Patient will increase FOTO score to 60 to show an increase in patients ability to perform functional activities without current limitations.    Baseline 07/22/20: 49    Time 6    Period Weeks    Status New    Target Date 09/02/20      PT LONG TERM GOAL #3   Title Patient will be able to lift objects/grandchildren up without pain to show an improvement in pain and strength of lower back.    Baseline Cannot Lift without pain increasing    Time 6    Period Weeks    Status New    Target Date 09/02/20      PT LONG TERM GOAL #4   Title Patient will be able to cook for a few hours without pain to show an improvement in pain and strength of lower back.    Baseline Can only stand~1- 2 hours    Time 6    Period Weeks    Status New    Target Date 09/02/20      PT LONG TERM GOAL #5   Title Patient will  improve 5xSTS to 12 seconds or less to show improvement in strength, power, and falls risk.    Baseline 07/31/20: 16sec    Time 6    Period Weeks    Status New    Target Date 09/02/20                 Plan - 07/29/20 1403    Clinical Impression Statement Continued to work on improving hip strength to help support/stabilize low back to help decrease symptoms. Patient requires verbal cueing to improve hip activation during exercises. Patient fatigues quickly and has difficulty with SLS indicating weakness in B hips.  Patient will continue to benefit from skilled therapy to return to prior level of function.    Personal Factors and Comorbidities Age;Comorbidity 3+    Comorbidities Osteoporosis, Athritis, Chronic LBP/Hip Bursitis    Examination-Activity Limitations Stand    Examination-Participation Restrictions Community Activity    Stability/Clinical Decision Making Stable/Uncomplicated    Clinical Decision Making Low    Rehab Potential Good    PT Frequency 2x / week    PT Duration 6 weeks    PT Treatment/Interventions ADLs/Self Care Home Management;Biofeedback;Electrical Stimulation;Moist Heat;Traction;DME Instruction;Gait training;Stair training;Functional mobility training;Therapeutic activities;Therapeutic exercise;Balance training;Neuromuscular re-education;Patient/family education;Manual techniques;Passive range of motion;Dry needling;Energy conservation;Spinal Manipulations;Joint Manipulations    PT Next Visit Plan begin Strengthening and balance    PT Home Exercise Plan SLR, Standing Hip Abduction and Extension    Consulted and Agree with Plan of Care Patient           Patient will benefit from skilled therapeutic intervention in order to improve the following deficits and impairments:  Abnormal gait, Decreased balance, Decreased endurance, Difficulty walking, Increased muscle spasms, Decreased range of motion, Impaired perceived functional ability, Improper body mechanics,  Decreased activity tolerance, Decreased coordination, Decreased strength, Pain, Postural dysfunction  Visit Diagnosis: Chronic bilateral low back pain without sciatica  Muscle weakness (generalized)     Problem List There are no problems to display for this patient.  2:31 PM, 07/29/20 Margarito Liner, SPT Student Physical Therapist [facility]  [phone]  Margarito Liner 07/29/2020, 2:30  PM  [facility] [address], [place], [address] Phone: [phone]   Fax:  [phone]  Name: Lindsay Fields MRN: [id number] Date of Birth: 1951-01-23

## 2020-07-31 ENCOUNTER — Ambulatory Visit: Payer: Medicare HMO

## 2020-07-31 ENCOUNTER — Other Ambulatory Visit: Payer: Self-pay

## 2020-07-31 DIAGNOSIS — M545 Low back pain, unspecified: Secondary | ICD-10-CM | POA: Diagnosis not present

## 2020-07-31 DIAGNOSIS — M6281 Muscle weakness (generalized): Secondary | ICD-10-CM

## 2020-07-31 DIAGNOSIS — G8929 Other chronic pain: Secondary | ICD-10-CM

## 2020-07-31 NOTE — Therapy (Signed)
[facility] [address] [place], [address] Phone: [phone]   Fax:  [phone]  Physical Therapy Treatment  Patient Details  Name: Lindsay Fields MRN: [id number] Date of Birth: 1951-05-14 Referring Provider (PT): Harvest Dark, FNP   Encounter Date: 07/31/2020   PT End of Session - 07/31/20 1514    Visit Number 4    Number of Visits 13    Date for PT Re-Evaluation 09/02/20    Authorization Type 4/10    PT Start Time 1515    PT Stop Time 1600    PT Time Calculation (min) 45 min    Activity Tolerance Patient tolerated treatment well;No increased pain    Behavior During Therapy WFL for tasks assessed/performed           Past Medical History:  Diagnosis Date   Allergic genetic state    Bladder polyps    Dysrhythmia    PVC's   Fibrocystic breast disease    Frequent UTI    H/O degenerative disc disease    Hypercholesterolemia    Osteoporosis     Past Surgical History:  Procedure Laterality Date   ABDOMINAL HYSTERECTOMY     BLADDER FULGURATION     COLONOSCOPY     COLONOSCOPY WITH PROPOFOL N/A 07/12/2018   Procedure: COLONOSCOPY WITH PROPOFOL;  Surgeon: Toledo, [name], MD;  Location: [facility];  Service: Gastroenterology;  Laterality: N/A;   DILATION AND CURETTAGE OF UTERUS     OVARIAN CYST REMOVAL     TONSILLECTOMY AND ADENOIDECTOMY      There were no vitals filed for this visit.   Subjective Assessment - 07/31/20 1512    Subjective Patient reports she had soreness in L glute after last session.    Pertinent History Chronic Hip Bursitis, Arthiritis, Osteoporosis    Limitations Lifting;Standing    How long can you sit comfortably? Unlimited    How long can you stand comfortably? ~15 minutes    How long can you walk comfortably? unlimited    Diagnostic tests MRI of Low back: DDD    Patient Stated Goals To be able to lift objects/grandchildren without having pain     Currently in Pain? No/denies          Interventions    Therapeutic Exercise Standing Hip Extension YTB 2x15  Standing Hip Circles YTB 2x10 CW and CCW 2 x 12 sit to stands 10# (lowest mat position) 2x10 B Monster walks 2x15 deadlifts 20#  2x15 kb swings 20#  Lunges with UE Support 2x10 B  Step-Ups 8inch 2x10 B     Performed exercises to decrease increased spasms and pain    PT Education - 07/31/20 1513    Education Details Form/Technique with Exercise    Person(s) Educated Patient    Methods Explanation;Demonstration    Comprehension Verbalized understanding;Returned demonstration            PT Short Term Goals - 07/22/20 1356      PT SHORT TERM GOAL #1   Title Patient will demonstrate independence with HEP to maximize rehab potential.    Time 3    Period Weeks    Status New    Target Date 08/12/20             PT Long Term Goals - 07/22/20 1357      PT LONG TERM GOAL #1   Title Patient will demonstrate independence with progressive HEP to maintain progress made during  POC.    Time 6    Period Weeks    Status New    Target Date 09/02/20      PT LONG TERM GOAL #2   Title Patient will increase FOTO score to 60 to show an increase in patients ability to perform functional activities without current limitations.    Baseline 07/22/20: 49    Time 6    Period Weeks    Status New    Target Date 09/02/20      PT LONG TERM GOAL #3   Title Patient will be able to lift objects/grandchildren up without pain to show an improvement in pain and strength of lower back.    Baseline Cannot Lift without pain increasing    Time 6    Period Weeks    Status New    Target Date 09/02/20      PT LONG TERM GOAL #4   Title Patient will be able to cook for a few hours without pain to show an improvement in pain and strength of lower back.    Baseline Can only stand~1- 2 hours    Time 6    Period Weeks    Status New    Target Date 09/02/20      PT LONG TERM GOAL #5    Title Patient will improve 5xSTS to 12 seconds or less to show improvement in strength, power, and falls risk.    Baseline 07/31/20: 16sec    Time 6    Period Weeks    Status New    Target Date 09/02/20                 Plan - 07/31/20 1514    Clinical Impression Statement Continued to focus on improving patients hips and LE strength during session.  Patient tolerated exercises well without an increase in symptoms.  Required verbal cueing for correct form and intended movement of exercises.  Patient is making progress; however, is still limited when lifting heavy objects and with SLS.  Patient will continue to benefit from skilled therapy to return to prior level of function.    Personal Factors and Comorbidities Age;Comorbidity 3+    Comorbidities Osteoporosis, Athritis, Chronic LBP/Hip Bursitis    Examination-Activity Limitations Stand    Examination-Participation Restrictions Community Activity    Stability/Clinical Decision Making Stable/Uncomplicated    Clinical Decision Making Low    Rehab Potential Good    PT Frequency 2x / week    PT Duration 6 weeks    PT Treatment/Interventions ADLs/Self Care Home Management;Biofeedback;Electrical Stimulation;Moist Heat;Traction;DME Instruction;Gait training;Stair training;Functional mobility training;Therapeutic activities;Therapeutic exercise;Balance training;Neuromuscular re-education;Patient/family education;Manual techniques;Passive range of motion;Dry needling;Energy conservation;Spinal Manipulations;Joint Manipulations    PT Next Visit Plan begin Strengthening and balance    PT Home Exercise Plan SLR, Standing Hip Abduction and Extension    Consulted and Agree with Plan of Care Patient           Patient will benefit from skilled therapeutic intervention in order to improve the following deficits and impairments:  Abnormal gait, Decreased balance, Decreased endurance, Difficulty walking, Increased muscle spasms, Decreased range of  motion, Impaired perceived functional ability, Improper body mechanics, Decreased activity tolerance, Decreased coordination, Decreased strength, Pain, Postural dysfunction  Visit Diagnosis: Chronic bilateral low back pain without sciatica  Muscle weakness (generalized)     Problem List There are no problems to display for this patient.  3:56 PM, 07/31/20 Lindsay Fields, SPT Student Physical Therapist [place]  Lindsay Fields  07/31/2020, 3:45 PM  [facility] [address] [place], [address] Phone: [phone]   Fax:  [phone]  Name: Lindsay Fields MRN: [id number] Date of Birth: 01-Aug-1951

## 2020-08-04 ENCOUNTER — Ambulatory Visit: Payer: Medicare HMO

## 2020-08-04 ENCOUNTER — Other Ambulatory Visit: Payer: Self-pay

## 2020-08-04 DIAGNOSIS — G8929 Other chronic pain: Secondary | ICD-10-CM

## 2020-08-04 DIAGNOSIS — M545 Low back pain, unspecified: Secondary | ICD-10-CM | POA: Diagnosis not present

## 2020-08-04 DIAGNOSIS — M6281 Muscle weakness (generalized): Secondary | ICD-10-CM

## 2020-08-04 NOTE — Therapy (Cosign Needed)
[facility] [address] [place], [address] Phone: [phone]   Fax:  [phone]  Physical Therapy Treatment  Patient Details  Name: Lindsay Fields MRN: [id number] Date of Birth: 06-23-1951 Referring Provider (PT): Harvest Dark, FNP   Encounter Date: 08/04/2020   PT End of Session - 08/04/20 1115    Visit Number 5    Number of Visits 13    Date for PT Re-Evaluation 09/02/20    Authorization Type 5/10    PT Start Time 1115    PT Stop Time 1158    PT Time Calculation (min) 43 min    Activity Tolerance Patient tolerated treatment well;No increased pain    Behavior During Therapy WFL for tasks assessed/performed           Past Medical History:  Diagnosis Date  . Allergic genetic state   . Bladder polyps   . Dysrhythmia    PVC's  . Fibrocystic breast disease   . Frequent UTI   . H/O degenerative disc disease   . Hypercholesterolemia   . Osteoporosis     Past Surgical History:  Procedure Laterality Date  . ABDOMINAL HYSTERECTOMY    . BLADDER FULGURATION    . COLONOSCOPY    . COLONOSCOPY WITH PROPOFOL N/A 07/12/2018   Procedure: COLONOSCOPY WITH PROPOFOL;  Surgeon: Toledo, [name], MD;  Location: [facility];  Service: Gastroenterology;  Laterality: N/A;  . DILATION AND CURETTAGE OF UTERUS    . OVARIAN CYST REMOVAL    . TONSILLECTOMY AND ADENOIDECTOMY      There were no vitals filed for this visit.   Subjective Assessment - 08/04/20 1114    Subjective Patient reports she had pain after picking up her grandchildren this weekend.    Pertinent History Chronic Hip Bursitis, Arthiritis, Osteoporosis    Limitations Lifting;Standing    How long can you sit comfortably? Unlimited    How long can you stand comfortably? ~15 minutes    How long can you walk comfortably? unlimited    Diagnostic tests MRI of Low back: DDD    Patient Stated Goals To be able to lift objects/grandchildren  without having pain    Currently in Pain? Yes    Pain Score 3     Pain Location Back           Interventions    Therapeutic Exercise Standing Hip Extension at Hip Machine 2x15 40# Standing Hip Abduction at Hip Machine 2x15 40# 2 x 12 sit to stands 10# (lowest mat position) 2x15 B Monster walks RTB  2x15 deadlifts 20#  2x15 kb swings 20#  Lunges with UE Support 2x10 B  Step-Ups 8inch 2x10 B     Performed exercises to decrease increased spasms and pain  Improved exercise technique, movement at target joints, use of target muscles after min to mod verbal, visual, tactile cues.      PT Education - 08/04/20 1115    Education Details Form/Technique with Exercise    Person(s) Educated Patient    Methods Explanation;Demonstration    Comprehension Verbalized understanding;Returned demonstration            PT Short Term Goals - 07/22/20 1356      PT SHORT TERM GOAL #1   Title Patient will demonstrate independence with HEP to maximize rehab potential.    Time 3    Period Weeks    Status New    Target Date 08/12/20  PT Long Term Goals - 07/22/20 1357      PT LONG TERM GOAL #1   Title Patient will demonstrate independence with progressive HEP to maintain progress made during POC.    Time 6    Period Weeks    Status New    Target Date 09/02/20      PT LONG TERM GOAL #2   Title Patient will increase FOTO score to 60 to show an increase in patients ability to perform functional activities without current limitations.    Baseline 07/22/20: 49    Time 6    Period Weeks    Status New    Target Date 09/02/20      PT LONG TERM GOAL #3   Title Patient will be able to lift objects/grandchildren up without pain to show an improvement in pain and strength of lower back.    Baseline Cannot Lift without pain increasing    Time 6    Period Weeks    Status New    Target Date 09/02/20      PT LONG TERM GOAL #4   Title Patient will be able to cook for a few  hours without pain to show an improvement in pain and strength of lower back.    Baseline Can only stand~1- 2 hours    Time 6    Period Weeks    Status New    Target Date 09/02/20      PT LONG TERM GOAL #5   Title Patient will improve 5xSTS to 12 seconds or less to show improvement in strength, power, and falls risk.    Baseline 07/31/20: 16sec    Time 6    Period Weeks    Status New    Target Date 09/02/20                 Plan - 08/04/20 1140    Clinical Impression Statement Continued to work in improving patients back pain by improving patients low back strength along with hip strength to provide stability to decrease pain. Patient had mild pain throughout session in L lower back, however, pain did not increase. Patient requires extensive verbal cueing to incorporate more extension during exercises. Patient will continue to benefit from skilled therapy to return to prior level of function.    Personal Factors and Comorbidities Age;Comorbidity 3+    Comorbidities Osteoporosis, Athritis, Chronic LBP/Hip Bursitis    Examination-Activity Limitations Stand    Examination-Participation Restrictions Community Activity    Stability/Clinical Decision Making Stable/Uncomplicated    Clinical Decision Making Low    Rehab Potential Good    PT Frequency 2x / week    PT Duration 6 weeks    PT Treatment/Interventions ADLs/Self Care Home Management;Biofeedback;Electrical Stimulation;Moist Heat;Traction;DME Instruction;Gait training;Stair training;Functional mobility training;Therapeutic activities;Therapeutic exercise;Balance training;Neuromuscular re-education;Patient/family education;Manual techniques;Passive range of motion;Dry needling;Energy conservation;Spinal Manipulations;Joint Manipulations    PT Next Visit Plan begin Strengthening and balance    PT Home Exercise Plan SLR, Standing Hip Abduction and Extension    Consulted and Agree with Plan of Care Patient           Patient  will benefit from skilled therapeutic intervention in order to improve the following deficits and impairments:  Abnormal gait, Decreased balance, Decreased endurance, Difficulty walking, Increased muscle spasms, Decreased range of motion, Impaired perceived functional ability, Improper body mechanics, Decreased activity tolerance, Decreased coordination, Decreased strength, Pain, Postural dysfunction  Visit Diagnosis: Chronic bilateral low back pain without sciatica  Muscle weakness (generalized)  Problem List There are no problems to display for this patient.    Student physical therapist under direct supervision of licensed physical therapist during the entirety of the session.   Joneen Boers PT, DPT    11:58 AM, 08/04/20 Margarito Liner, SPT Student Physical Therapist [place]  [phone]   Laygo,Miguel 08/04/2020, 12:12 PM  [facility] [address], [place], [address] Phone: [phone]   Fax:  [phone]  Name: Lindsay Fields MRN: [id number] Date of Birth: 12-20-1950

## 2020-08-06 ENCOUNTER — Other Ambulatory Visit: Payer: Self-pay

## 2020-08-06 ENCOUNTER — Ambulatory Visit: Payer: Medicare HMO

## 2020-08-06 DIAGNOSIS — G8929 Other chronic pain: Secondary | ICD-10-CM

## 2020-08-06 DIAGNOSIS — M545 Low back pain, unspecified: Secondary | ICD-10-CM | POA: Diagnosis not present

## 2020-08-06 DIAGNOSIS — M6281 Muscle weakness (generalized): Secondary | ICD-10-CM

## 2020-08-06 NOTE — Therapy (Signed)
[facility] [address], [place], [address] Phone: [phone]   Fax:  [phone]  Physical Therapy Treatment  Patient Details  Name: Lindsay Fields MRN: [id number] Date of Birth: August 09, 1951 Referring Provider (PT): Harvest Dark, FNP   Encounter Date: 08/06/2020   PT End of Session - 08/06/20 1347    Visit Number 6    Number of Visits 13    Date for PT Re-Evaluation 09/02/20    Authorization Type 6/10    PT Start Time 1497    PT Stop Time 1430    PT Time Calculation (min) 45 min    Activity Tolerance Patient tolerated treatment well;No increased pain    Behavior During Therapy WFL for tasks assessed/performed           Past Medical History:  Diagnosis Date  . Allergic genetic state   . Bladder polyps   . Dysrhythmia    PVC's  . Fibrocystic breast disease   . Frequent UTI   . H/O degenerative disc disease   . Hypercholesterolemia   . Osteoporosis     Past Surgical History:  Procedure Laterality Date  . ABDOMINAL HYSTERECTOMY    . BLADDER FULGURATION    . COLONOSCOPY    . COLONOSCOPY WITH PROPOFOL N/A 07/12/2018   Procedure: COLONOSCOPY WITH PROPOFOL;  Surgeon: Toledo, [name], MD;  Location: [facility];  Service: Gastroenterology;  Laterality: N/A;  . DILATION AND CURETTAGE OF UTERUS    . OVARIAN CYST REMOVAL    . TONSILLECTOMY AND ADENOIDECTOMY      There were no vitals filed for this visit.   Subjective Assessment - 08/06/20 1346    Subjective Patient reports soreness in L low back due to standing for extended period of time yesterday    Pertinent History Chronic Hip Bursitis, Arthiritis, Osteoporosis    Limitations Lifting;Standing    How long can you sit comfortably? Unlimited    How long can you stand comfortably? ~15 minutes    How long can you walk comfortably? unlimited    Diagnostic tests MRI of Low back: DDD    Patient Stated Goals To be able to lift  objects/grandchildren without having pain    Currently in Pain? Yes    Pain Score 4     Pain Location Back    Pain Orientation Left;Lower          Interventions    Therapeutic Exercise NuStep (level 3; Seat and handle 6) x24min  Standing Hip Extension at Hip Machine x15 40#, x15 55# Standing Hip Abduction at Hip Machine 2x15 40# Total Gym Squats Level 26 2x15  Total Gym SL Squats Level19 2x8 2x15 B Monster walks RTB  2x12 deadlifts 30#, x5 40# Lunges with UE Support 2x10 B 5#     Performed exercises to decrease increased spasms and pain       PT Education - 08/06/20 1347    Education Details Form/Technique with Exercise    Person(s) Educated Patient    Methods Explanation;Demonstration    Comprehension Verbalized understanding;Returned demonstration            PT Short Term Goals - 07/22/20 1356      PT SHORT TERM GOAL #1   Title Patient will demonstrate independence with HEP to maximize rehab potential.    Time 3    Period Weeks    Status New    Target Date 08/12/20  PT Long Term Goals - 07/22/20 1357      PT LONG TERM GOAL #1   Title Patient will demonstrate independence with progressive HEP to maintain progress made during POC.    Time 6    Period Weeks    Status New    Target Date 09/02/20      PT LONG TERM GOAL #2   Title Patient will increase FOTO score to 60 to show an increase in patients ability to perform functional activities without current limitations.    Baseline 07/22/20: 49    Time 6    Period Weeks    Status New    Target Date 09/02/20      PT LONG TERM GOAL #3   Title Patient will be able to lift objects/grandchildren up without pain to show an improvement in pain and strength of lower back.    Baseline Cannot Lift without pain increasing    Time 6    Period Weeks    Status New    Target Date 09/02/20      PT LONG TERM GOAL #4   Title Patient will be able to cook for a few hours without pain to show an  improvement in pain and strength of lower back.    Baseline Can only stand~1- 2 hours    Time 6    Period Weeks    Status New    Target Date 09/02/20      PT LONG TERM GOAL #5   Title Patient will improve 5xSTS to 12 seconds or less to show improvement in strength, power, and falls risk.    Baseline 07/31/20: 16sec    Time 6    Period Weeks    Status New    Target Date 09/02/20                 Plan - 08/06/20 1349    Clinical Impression Statement Focused on addressing patient's strength and endurance of LE and hips to provided further support for low back.  Patient continues to require verbal cueing to improve hip activation during exercises.  Patient has difficulty with SLS with L LE demonstrating more weakness and fatigue when compared to contralateral LE. Patient will continue to benefit from skilled therapy to return to prior level of function.    Personal Factors and Comorbidities Age;Comorbidity 3+    Comorbidities Osteoporosis, Athritis, Chronic LBP/Hip Bursitis    Examination-Activity Limitations Stand    Examination-Participation Restrictions Community Activity    Stability/Clinical Decision Making Stable/Uncomplicated    Clinical Decision Making Low    Rehab Potential Good    PT Frequency 2x / week    PT Duration 6 weeks    PT Treatment/Interventions ADLs/Self Care Home Management;Biofeedback;Electrical Stimulation;Moist Heat;Traction;DME Instruction;Gait training;Stair training;Functional mobility training;Therapeutic activities;Therapeutic exercise;Balance training;Neuromuscular re-education;Patient/family education;Manual techniques;Passive range of motion;Dry needling;Energy conservation;Spinal Manipulations;Joint Manipulations    PT Next Visit Plan begin Strengthening and balance    PT Home Exercise Plan SLR, Standing Hip Abduction and Extension    Consulted and Agree with Plan of Care Patient           Patient will benefit from skilled therapeutic  intervention in order to improve the following deficits and impairments:  Abnormal gait, Decreased balance, Decreased endurance, Difficulty walking, Increased muscle spasms, Decreased range of motion, Impaired perceived functional ability, Improper body mechanics, Decreased activity tolerance, Decreased coordination, Decreased strength, Pain, Postural dysfunction  Visit Diagnosis: Muscle weakness (generalized)  Chronic bilateral low back pain without sciatica  Problem List There are no problems to display for this patient.  2:29 PM, 08/06/20 Margarito Liner, SPT Student Physical Therapist [place]  [phone]  Margarito Liner 08/06/2020, 2:23 PM  [facility] [address], [place], [address] Phone: [phone]   Fax:  [phone]  Name: Lindsay Fields MRN: [id number] Date of Birth: 1951/03/15

## 2020-08-11 ENCOUNTER — Other Ambulatory Visit: Payer: Self-pay

## 2020-08-11 ENCOUNTER — Ambulatory Visit: Payer: Medicare HMO | Attending: Family Medicine

## 2020-08-11 DIAGNOSIS — G8929 Other chronic pain: Secondary | ICD-10-CM | POA: Diagnosis present

## 2020-08-11 DIAGNOSIS — M545 Low back pain, unspecified: Secondary | ICD-10-CM | POA: Diagnosis present

## 2020-08-11 DIAGNOSIS — M6281 Muscle weakness (generalized): Secondary | ICD-10-CM | POA: Diagnosis present

## 2020-08-11 NOTE — Therapy (Signed)
[facility] [address], [place], [address] Phone: [phone]   Fax:  [phone]  Physical Therapy Treatment  Patient Details  Name: Lindsay Fields MRN: [id number] Date of Birth: September 20, 1951 Referring Provider (PT): Harvest Dark, FNP   Encounter Date: 08/11/2020   PT End of Session - 08/11/20 1303    Visit Number 7    Number of Visits 13    Date for PT Re-Evaluation 09/02/20    Authorization Type 7/10    PT Start Time 1300    PT Stop Time 1345    PT Time Calculation (min) 45 min    Activity Tolerance Patient tolerated treatment well;No increased pain    Behavior During Therapy WFL for tasks assessed/performed           Past Medical History:  Diagnosis Date  . Allergic genetic state   . Bladder polyps   . Dysrhythmia    PVC's  . Fibrocystic breast disease   . Frequent UTI   . H/O degenerative disc disease   . Hypercholesterolemia   . Osteoporosis     Past Surgical History:  Procedure Laterality Date  . ABDOMINAL HYSTERECTOMY    . BLADDER FULGURATION    . COLONOSCOPY    . COLONOSCOPY WITH PROPOFOL N/A 07/12/2018   Procedure: COLONOSCOPY WITH PROPOFOL;  Surgeon: Toledo, [name], MD;  Location: [facility];  Service: Gastroenterology;  Laterality: N/A;  . DILATION AND CURETTAGE OF UTERUS    . OVARIAN CYST REMOVAL    . TONSILLECTOMY AND ADENOIDECTOMY      There were no vitals filed for this visit.   Subjective Assessment - 08/11/20 1302    Subjective Patient reports she had sharp pain throughout the weekend    Pertinent History Chronic Hip Bursitis, Arthiritis, Osteoporosis    Limitations Lifting;Standing    How long can you sit comfortably? Unlimited    How long can you stand comfortably? ~15 minutes    How long can you walk comfortably? unlimited    Diagnostic tests MRI of Low back: DDD    Patient Stated Goals To be able to lift objects/grandchildren without having pain     Currently in Pain? Yes    Pain Score 4     Pain Location Back           Interventions   Manual Therapy -DN perform to L Glute Max/Med with 0.30 x 127mm to decrease pain and muscle spasm  (Patient educated on risks/benefits and verbally consents to treatment)    Therapeutic Exercise Standing Hip Extension at Hip Machine x15 40#, x15 55# Standing Hip Abduction at Hip Machine 2x15 40# Squats to Mat at Lowest 3x10 20# 2x15 B Monster walks RTB  3x10 deadlifts 40#    Performed exercises to decrease increased spasms and pain      PT Education - 08/11/20 1303    Education Details Form/Technique with Exercise    Person(s) Educated Patient    Methods Explanation;Demonstration    Comprehension Verbalized understanding;Returned demonstration            PT Short Term Goals - 07/22/20 1356      PT SHORT TERM GOAL #1   Title Patient will demonstrate independence with HEP to maximize rehab potential.    Time 3    Period Weeks    Status New    Target Date 08/12/20             PT Long Term  Goals - 07/22/20 1357      PT LONG TERM GOAL #1   Title Patient will demonstrate independence with progressive HEP to maintain progress made during POC.    Time 6    Period Weeks    Status New    Target Date 09/02/20      PT LONG TERM GOAL #2   Title Patient will increase FOTO score to 60 to show an increase in patients ability to perform functional activities without current limitations.    Baseline 07/22/20: 49    Time 6    Period Weeks    Status New    Target Date 09/02/20      PT LONG TERM GOAL #3   Title Patient will be able to lift objects/grandchildren up without pain to show an improvement in pain and strength of lower back.    Baseline Cannot Lift without pain increasing    Time 6    Period Weeks    Status New    Target Date 09/02/20      PT LONG TERM GOAL #4   Title Patient will be able to cook for a few hours without pain to show an improvement in pain and  strength of lower back.    Baseline Can only stand~1- 2 hours    Time 6    Period Weeks    Status New    Target Date 09/02/20      PT LONG TERM GOAL #5   Title Patient will improve 5xSTS to 12 seconds or less to show improvement in strength, power, and falls risk.    Baseline 07/31/20: 16sec    Time 6    Period Weeks    Status New    Target Date 09/02/20                 Plan - 08/11/20 1317    Clinical Impression Statement Performed DN to patients L glute max/med to help decrease pain and muscle spasms.  Patient responded well to DN with an in-session change in symptoms. Continued strengthening patients hips and LE's during session with focus mainly on glutes to improve symptoms. Patient verbally cued to improve glute activation during exercises.  Patient will continue to benefit from skilled therapy to return to prior level of function.    Personal Factors and Comorbidities Age;Comorbidity 3+    Comorbidities Osteoporosis, Athritis, Chronic LBP/Hip Bursitis    Examination-Activity Limitations Stand    Examination-Participation Restrictions Community Activity    Stability/Clinical Decision Making Stable/Uncomplicated    Clinical Decision Making Low    Rehab Potential Good    PT Frequency 2x / week    PT Duration 6 weeks    PT Treatment/Interventions ADLs/Self Care Home Management;Biofeedback;Electrical Stimulation;Moist Heat;Traction;DME Instruction;Gait training;Stair training;Functional mobility training;Therapeutic activities;Therapeutic exercise;Balance training;Neuromuscular re-education;Patient/family education;Manual techniques;Passive range of motion;Dry needling;Energy conservation;Spinal Manipulations;Joint Manipulations    PT Next Visit Plan begin Strengthening and balance    PT Home Exercise Plan SLR, Standing Hip Abduction and Extension    Consulted and Agree with Plan of Care Patient           Patient will benefit from skilled therapeutic intervention in  order to improve the following deficits and impairments:  Abnormal gait, Decreased balance, Decreased endurance, Difficulty walking, Increased muscle spasms, Decreased range of motion, Impaired perceived functional ability, Improper body mechanics, Decreased activity tolerance, Decreased coordination, Decreased strength, Pain, Postural dysfunction  Visit Diagnosis: Chronic bilateral low back pain without sciatica  Muscle weakness (generalized)  Problem List There are no problems to display for this patient.  1:44 PM, 08/11/20 Margarito Liner, SPT Student Physical Therapist [place]  [phone]  Margarito Liner 08/11/2020, 1:43 PM  [facility] [address], [place], [address] Phone: [phone]   Fax:  [phone]  Name: Lindsay Fields MRN: [id number] Date of Birth: Feb 17, 1951

## 2020-08-14 ENCOUNTER — Other Ambulatory Visit: Payer: Self-pay

## 2020-08-14 ENCOUNTER — Ambulatory Visit: Payer: Medicare HMO

## 2020-08-14 DIAGNOSIS — M6281 Muscle weakness (generalized): Secondary | ICD-10-CM

## 2020-08-14 DIAGNOSIS — G8929 Other chronic pain: Secondary | ICD-10-CM

## 2020-08-14 DIAGNOSIS — M545 Low back pain, unspecified: Secondary | ICD-10-CM | POA: Diagnosis not present

## 2020-08-14 NOTE — Therapy (Signed)
[facility] [address], [place], [address] Phone: [phone]   Fax:  [phone]  Physical Therapy Treatment  Patient Details  Name: Lindsay Fields MRN: [id number] Date of Birth: 02-19-1951 Referring Provider (PT): Harvest Dark, FNP   Encounter Date: 08/14/2020   PT End of Session - 08/14/20 1301    Visit Number 8    Number of Visits 13    Date for PT Re-Evaluation 09/02/20    Authorization Type 8/10    PT Start Time 1300    PT Stop Time 1345    PT Time Calculation (min) 45 min    Activity Tolerance Patient tolerated treatment well;No increased pain    Behavior During Therapy WFL for tasks assessed/performed           Past Medical History:  Diagnosis Date   Allergic genetic state    Bladder polyps    Dysrhythmia    PVC's   Fibrocystic breast disease    Frequent UTI    H/O degenerative disc disease    Hypercholesterolemia    Osteoporosis     Past Surgical History:  Procedure Laterality Date   ABDOMINAL HYSTERECTOMY     BLADDER FULGURATION     COLONOSCOPY     COLONOSCOPY WITH PROPOFOL N/A 07/12/2018   Procedure: COLONOSCOPY WITH PROPOFOL;  Surgeon: Toledo, [name], MD;  Location: [facility];  Service: Gastroenterology;  Laterality: N/A;   DILATION AND CURETTAGE OF UTERUS     OVARIAN CYST REMOVAL     TONSILLECTOMY AND ADENOIDECTOMY      There were no vitals filed for this visit.   Subjective Assessment - 08/14/20 1300    Subjective Patient reports she is seeing improvement in her symptoms after DN.    Pertinent History Chronic Hip Bursitis, Arthiritis, Osteoporosis    Limitations Lifting;Standing    How long can you sit comfortably? Unlimited    How long can you stand comfortably? ~15 minutes    How long can you walk comfortably? unlimited    Diagnostic tests MRI of Low back: DDD    Patient Stated Goals To be able to lift objects/grandchildren without having  pain    Currently in Pain? Yes    Pain Score 2     Pain Location Back           Interventions    Manual Therapy -DN perform to L Glute Max/Med with 0.30 x 154mm to decrease pain and muscle spasm  (Patient educated on risks/benefits and verbally consents to treatment)    Therapeutic Exercise Standing Hip Extension at Hip Machine 2x15 55#, x15 70# Standing Hip Abduction at Hip Machine 3x12 55# Total Gym Squats Level 26 3x15 Squats with 20# 2x12 Lunges 2x10 B  3x12 deadlifts 40#    Performed exercises to decrease increased spasms and pain   PT Education - 08/14/20 1301    Education Details Form/Technique with Exercise    Person(s) Educated Patient    Methods Explanation;Demonstration    Comprehension Verbalized understanding;Returned demonstration            PT Short Term Goals - 07/22/20 1356      PT SHORT TERM GOAL #1   Title Patient will demonstrate independence with HEP to maximize rehab potential.    Time 3    Period Weeks    Status New    Target Date 08/12/20             PT Long  Term Goals - 07/22/20 1357      PT LONG TERM GOAL #1   Title Patient will demonstrate independence with progressive HEP to maintain progress made during POC.    Time 6    Period Weeks    Status New    Target Date 09/02/20      PT LONG TERM GOAL #2   Title Patient will increase FOTO score to 60 to show an increase in patients ability to perform functional activities without current limitations.    Baseline 07/22/20: 49    Time 6    Period Weeks    Status New    Target Date 09/02/20      PT LONG TERM GOAL #3   Title Patient will be able to lift objects/grandchildren up without pain to show an improvement in pain and strength of lower back.    Baseline Cannot Lift without pain increasing    Time 6    Period Weeks    Status New    Target Date 09/02/20      PT LONG TERM GOAL #4   Title Patient will be able to cook for a few hours without pain to show an improvement in  pain and strength of lower back.    Baseline Can only stand~1- 2 hours    Time 6    Period Weeks    Status New    Target Date 09/02/20      PT LONG TERM GOAL #5   Title Patient will improve 5xSTS to 12 seconds or less to show improvement in strength, power, and falls risk.    Baseline 07/31/20: 16sec    Time 6    Period Weeks    Status New    Target Date 09/02/20                 Plan - 08/14/20 1343    Clinical Impression Statement DN L glute max/med at beginning of session due to report of symptoms remaining and an improvement in symptoms from performing DN to area last session.  Continued with strengthening of hips and low back during session.  Patient showing improvement in strength demonstrated by ability to tolerate an increase in exercise volume. Patient has difficulty with getting up from a standard chair without use of hands.  Patients will continue to benefit from skilled therapy to return to prior level of function.    Personal Factors and Comorbidities Age;Comorbidity 3+    Comorbidities Osteoporosis, Athritis, Chronic LBP/Hip Bursitis    Examination-Activity Limitations Stand    Examination-Participation Restrictions Community Activity    Stability/Clinical Decision Making Stable/Uncomplicated    Clinical Decision Making Low    Rehab Potential Good    PT Frequency 2x / week    PT Duration 6 weeks    PT Treatment/Interventions ADLs/Self Care Home Management;Biofeedback;Electrical Stimulation;Moist Heat;Traction;DME Instruction;Gait training;Stair training;Functional mobility training;Therapeutic activities;Therapeutic exercise;Balance training;Neuromuscular re-education;Patient/family education;Manual techniques;Passive range of motion;Dry needling;Energy conservation;Spinal Manipulations;Joint Manipulations    PT Next Visit Plan begin Strengthening and balance    PT Home Exercise Plan SLR, Standing Hip Abduction and Extension    Consulted and Agree with Plan of Care  Patient           Patient will benefit from skilled therapeutic intervention in order to improve the following deficits and impairments:  Abnormal gait, Decreased balance, Decreased endurance, Difficulty walking, Increased muscle spasms, Decreased range of motion, Impaired perceived functional ability, Improper body mechanics, Decreased activity tolerance, Decreased coordination, Decreased strength, Pain, Postural  dysfunction  Visit Diagnosis: Chronic bilateral low back pain without sciatica  Muscle weakness (generalized)     Problem List There are no problems to display for this patient.  1:45 PM, 08/14/20 Margarito Liner, SPT Student Physical Therapist [place]  [phone]  Margarito Liner 08/14/2020, 1:45 PM  [facility] [address] [place], [address] Phone: [phone]   Fax:  [phone]  Name: DEVLYNN KNOFF MRN: [id number] Date of Birth: 09-06-1951

## 2020-08-18 ENCOUNTER — Ambulatory Visit: Payer: Medicare HMO

## 2020-08-21 ENCOUNTER — Other Ambulatory Visit: Payer: Self-pay

## 2020-08-21 ENCOUNTER — Ambulatory Visit: Payer: Medicare HMO

## 2020-08-21 DIAGNOSIS — M6281 Muscle weakness (generalized): Secondary | ICD-10-CM

## 2020-08-21 DIAGNOSIS — M545 Low back pain, unspecified: Secondary | ICD-10-CM

## 2020-08-21 DIAGNOSIS — G8929 Other chronic pain: Secondary | ICD-10-CM

## 2020-08-21 NOTE — Therapy (Signed)
[facility] [address], [place], [address] Phone: [phone]   Fax:  [phone]  Physical Therapy Treatment  Patient Details  Name: Lindsay Fields MRN: [id number] Date of Birth: August 11, 1951 Referring Provider (PT): Harvest Dark, FNP   Encounter Date: 08/21/2020   PT End of Session - 08/21/20 1331    Visit Number 9    Number of Visits 13    Date for PT Re-Evaluation 09/02/20    Authorization Type 9/10    PT Start Time 1330    PT Stop Time 1415    PT Time Calculation (min) 45 min    Activity Tolerance Patient tolerated treatment well;No increased pain    Behavior During Therapy WFL for tasks assessed/performed           Past Medical History:  Diagnosis Date  . Allergic genetic state   . Bladder polyps   . Dysrhythmia    PVC's  . Fibrocystic breast disease   . Frequent UTI   . H/O degenerative disc disease   . Hypercholesterolemia   . Osteoporosis     Past Surgical History:  Procedure Laterality Date  . ABDOMINAL HYSTERECTOMY    . BLADDER FULGURATION    . COLONOSCOPY    . COLONOSCOPY WITH PROPOFOL N/A 07/12/2018   Procedure: COLONOSCOPY WITH PROPOFOL;  Surgeon: Toledo, [name], MD;  Location: [facility];  Service: Gastroenterology;  Laterality: N/A;  . DILATION AND CURETTAGE OF UTERUS    . OVARIAN CYST REMOVAL    . TONSILLECTOMY AND ADENOIDECTOMY      There were no vitals filed for this visit.   Subjective Assessment - 08/21/20 1330    Subjective Patient reports she hasnt been able to due much due to having a recent cold.    Pertinent History Chronic Hip Bursitis, Arthiritis, Osteoporosis    Limitations Lifting;Standing    How long can you sit comfortably? Unlimited    How long can you stand comfortably? ~15 minutes    How long can you walk comfortably? unlimited    Diagnostic tests MRI of Low back: DDD    Patient Stated Goals To be able to lift objects/grandchildren without  having pain    Currently in Pain? No/denies           Interventions     Therapeutic Exercise STS 2x12 Standing Hip Extension at Hip Machine 2x15 55#, x15 70# Standing Hip Abduction at Hip Machine 2x15 55# Total Gym Squats Level 26 3x15 Lunges 3x10 B  3x12 deadlifts 30#    Performed exercises to decrease increased spasms and pain     PT Education - 08/21/20 1331    Education Details Form/Technique with Exercise    Person(s) Educated Patient    Methods Explanation;Demonstration    Comprehension Verbalized understanding;Returned demonstration            PT Short Term Goals - 07/22/20 1356      PT SHORT TERM GOAL #1   Title Patient will demonstrate independence with HEP to maximize rehab potential.    Time 3    Period Weeks    Status New    Target Date 08/12/20             PT Long Term Goals - 07/22/20 1357      PT LONG TERM GOAL #1   Title Patient will demonstrate independence with progressive HEP to maintain progress made during POC.    Time 6  Period Weeks    Status New    Target Date 09/02/20      PT LONG TERM GOAL #2   Title Patient will increase FOTO score to 60 to show an increase in patients ability to perform functional activities without current limitations.    Baseline 07/22/20: 49    Time 6    Period Weeks    Status New    Target Date 09/02/20      PT LONG TERM GOAL #3   Title Patient will be able to lift objects/grandchildren up without pain to show an improvement in pain and strength of lower back.    Baseline Cannot Lift without pain increasing    Time 6    Period Weeks    Status New    Target Date 09/02/20      PT LONG TERM GOAL #4   Title Patient will be able to cook for a few hours without pain to show an improvement in pain and strength of lower back.    Baseline Can only stand~1- 2 hours    Time 6    Period Weeks    Status New    Target Date 09/02/20      PT LONG TERM GOAL #5   Title Patient will improve 5xSTS to 12  seconds or less to show improvement in strength, power, and falls risk.    Baseline 07/31/20: 16sec    Time 6    Period Weeks    Status New    Target Date 09/02/20                 Plan - 08/21/20 1355    Clinical Impression Statement Continued focusing on improving patient low back and LE strength at a lighter intensity due to head cold. Patient requires verbal cueing to initiate glute activation at top of movements. Patient is demonstrating improvement by having less pain during lifting movements in low back. Patient will benefit from skilled therapy to return to prior level of function.    Personal Factors and Comorbidities Age;Comorbidity 3+    Comorbidities Osteoporosis, Athritis, Chronic LBP/Hip Bursitis    Examination-Activity Limitations Stand    Examination-Participation Restrictions Community Activity    Stability/Clinical Decision Making Stable/Uncomplicated    Clinical Decision Making Low    Rehab Potential Good    PT Frequency 2x / week    PT Duration 6 weeks    PT Treatment/Interventions ADLs/Self Care Home Management;Biofeedback;Electrical Stimulation;Moist Heat;Traction;DME Instruction;Gait training;Stair training;Functional mobility training;Therapeutic activities;Therapeutic exercise;Balance training;Neuromuscular re-education;Patient/family education;Manual techniques;Passive range of motion;Dry needling;Energy conservation;Spinal Manipulations;Joint Manipulations    PT Next Visit Plan Strengthening and balance    PT Home Exercise Plan SLR, Standing Hip Abduction and Extension    Consulted and Agree with Plan of Care Patient           Patient will benefit from skilled therapeutic intervention in order to improve the following deficits and impairments:  Abnormal gait, Decreased balance, Decreased endurance, Difficulty walking, Increased muscle spasms, Decreased range of motion, Impaired perceived functional ability, Improper body mechanics, Decreased activity  tolerance, Decreased coordination, Decreased strength, Pain, Postural dysfunction  Visit Diagnosis: Chronic bilateral low back pain without sciatica  Muscle weakness (generalized)     Problem List There are no problems to display for this patient.  2:14 PM, 08/21/20 Margarito Liner, SPT Student Physical Therapist [place]  [phone]  Margarito Liner 08/21/2020, 1:55 PM  [facility] [address] [place],  [place], [address] Phone: [phone]   Fax:  [phone]  Name: Lindsay Fields MRN: [id number] Date of Birth: 02-19-1951

## 2020-08-25 ENCOUNTER — Ambulatory Visit: Payer: Medicare HMO

## 2020-08-25 ENCOUNTER — Other Ambulatory Visit: Payer: Self-pay

## 2020-08-25 DIAGNOSIS — M6281 Muscle weakness (generalized): Secondary | ICD-10-CM

## 2020-08-25 DIAGNOSIS — M545 Low back pain, unspecified: Secondary | ICD-10-CM | POA: Diagnosis not present

## 2020-08-25 DIAGNOSIS — G8929 Other chronic pain: Secondary | ICD-10-CM

## 2020-08-25 NOTE — Therapy (Signed)
[facility] [address] [place], [address] Phone: [phone]   Fax:  [phone]  Physical Therapy Treatment  Patient Details  Name: Lindsay Fields MRN: [id number] Date of Birth: Jul 27, 1951 Referring Provider (PT): Harvest Dark, FNP   Encounter Date: 08/25/2020   PT End of Session - 08/25/20 1304    Visit Number 10    Number of Visits 13    Date for PT Re-Evaluation 09/02/20    Authorization Type 10/10    PT Start Time 1300    PT Stop Time 1345    PT Time Calculation (min) 45 min    Activity Tolerance Patient tolerated treatment well;No increased pain    Behavior During Therapy WFL for tasks assessed/performed           Past Medical History:  Diagnosis Date  . Allergic genetic state   . Bladder polyps   . Dysrhythmia    PVC's  . Fibrocystic breast disease   . Frequent UTI   . H/O degenerative disc disease   . Hypercholesterolemia   . Osteoporosis     Past Surgical History:  Procedure Laterality Date  . ABDOMINAL HYSTERECTOMY    . BLADDER FULGURATION    . COLONOSCOPY    . COLONOSCOPY WITH PROPOFOL N/A 07/12/2018   Procedure: COLONOSCOPY WITH PROPOFOL;  Surgeon: Toledo, [name], MD;  Location: [facility];  Service: Gastroenterology;  Laterality: N/A;  . DILATION AND CURETTAGE OF UTERUS    . OVARIAN CYST REMOVAL    . TONSILLECTOMY AND ADENOIDECTOMY      There were no vitals filed for this visit.   Subjective Assessment - 08/25/20 1302    Subjective Patient reports she's feeling good but had DOMS after less session.    Pertinent History Chronic Hip Bursitis, Arthiritis, Osteoporosis    Limitations Lifting;Standing    How long can you sit comfortably? Unlimited    How long can you stand comfortably? ~15 minutes    How long can you walk comfortably? unlimited    Diagnostic tests MRI of Low back: DDD    Patient Stated Goals To be able to lift objects/grandchildren without having  pain    Currently in Pain? No/denies           Interventions    Manual Therapy -DN perform to L Glute Max/Med with 0.30 x 161m to decrease pain and muscle spasm  (Patient educated on risks/benefits and verbally consents to treatment)    Therapeutic Exercise Standing Hip Extension at Hip Machine 3x15 70# Standing Hip Abduction at Hip Machine 3x15 55# Total Gym Squats Level 26 3x15 Lunges 2x10 B  3x12 deadlifts 40#    Performed exercises to decrease increased spasms and pain      PT Education - 08/25/20 1303    Education Details Form/Technique with Exercise    Person(s) Educated Patient    Methods Explanation;Demonstration    Comprehension Verbalized understanding;Returned demonstration            PT Short Term Goals - 08/25/20 1305      PT SHORT TERM GOAL #1   Title Patient will demonstrate independence with HEP to maximize rehab potential.    Time 3    Period Weeks    Status Achieved    Target Date 08/12/20             PT Long Term Goals - 08/25/20 1305      PT LONG TERM GOAL #1  Title Patient will demonstrate independence with progressive HEP to maintain progress made during POC.    Time 6    Period Weeks    Status On-going      PT LONG TERM GOAL #2   Title Patient will increase FOTO score to 60 to show an increase in patients ability to perform functional activities without current limitations.    Baseline 07/22/20: 49; 08/25/20: 57    Time 6    Period Weeks    Status On-going      PT LONG TERM GOAL #3   Title Patient will be able to lift objects/grandchildren up without pain to show an improvement in pain and strength of lower back.    Baseline Cannot Lift without pain increasing; 08/25/20: Less pain    Time 6    Period Weeks    Status On-going      PT LONG TERM GOAL #4   Title Patient will be able to cook for a few hours without pain to show an improvement in pain and strength of lower back.    Baseline Can only stand~1- 2 hours; 08/25/20:  just stiffness no pain    Time 6    Period Weeks    Status Achieved      PT LONG TERM GOAL #5   Title Patient will improve 5xSTS to 12 seconds or less to show improvement in strength, power, and falls risk.    Baseline 07/31/20: 16sec; 08/25/20: 11.8sec    Time 6    Period Weeks    Status Achieved                 Plan - 08/25/20 1317    Clinical Impression Statement Patient is making progress demonstrated by increasing her FOTO from 3 to 40 showing an improvement in her ability to perform functional activities with less limitations due to pain.  Patient has also seen improvements in symptoms when lifting object around her house or lifting her grandchildren with there being less pain when doing so.  Patient met her goal of being able to stand without pain and met her 5xSTS with a time of 11.8sec. Continued working on low back and LE strength during session at which patient tolerated well.  Patient has made progress; however, symptoms remain with lifting and other functional activities and will benefit from skilled therapy to return to prior level of function.    Personal Factors and Comorbidities Age;Comorbidity 3+    Comorbidities Osteoporosis, Athritis, Chronic LBP/Hip Bursitis    Examination-Activity Limitations Stand    Examination-Participation Restrictions Community Activity    Stability/Clinical Decision Making Stable/Uncomplicated    Clinical Decision Making Low    Rehab Potential Good    PT Frequency 2x / week    PT Duration 6 weeks    PT Treatment/Interventions ADLs/Self Care Home Management;Biofeedback;Electrical Stimulation;Moist Heat;Traction;DME Instruction;Gait training;Stair training;Functional mobility training;Therapeutic activities;Therapeutic exercise;Balance training;Neuromuscular re-education;Patient/family education;Manual techniques;Passive range of motion;Dry needling;Energy conservation;Spinal Manipulations;Joint Manipulations    PT Next Visit Plan  Strengthening and balance    PT Home Exercise Plan SLR, Standing Hip Abduction and Extension    Consulted and Agree with Plan of Care Patient           Patient will benefit from skilled therapeutic intervention in order to improve the following deficits and impairments:  Abnormal gait, Decreased balance, Decreased endurance, Difficulty walking, Increased muscle spasms, Decreased range of motion, Impaired perceived functional ability, Improper body mechanics, Decreased activity tolerance, Decreased coordination, Decreased strength, Pain, Postural dysfunction  Visit Diagnosis: Chronic bilateral low back pain without sciatica  Muscle weakness (generalized)     Problem List There are no problems to display for this patient.  1:44 PM, 08/25/20 Margarito Liner, SPT Student Physical Therapist [place]  [phone]  Margarito Liner 08/25/2020, 1:18 PM  [facility] [address], [place], [address] Phone: [phone]   Fax:  [phone]  Name: Lindsay Fields MRN: [id number] Date of Birth: [birth year]

## 2020-08-28 ENCOUNTER — Ambulatory Visit: Payer: Medicare HMO

## 2020-08-28 ENCOUNTER — Other Ambulatory Visit: Payer: Self-pay

## 2020-08-28 DIAGNOSIS — G8929 Other chronic pain: Secondary | ICD-10-CM

## 2020-08-28 DIAGNOSIS — M6281 Muscle weakness (generalized): Secondary | ICD-10-CM

## 2020-08-28 DIAGNOSIS — M545 Low back pain, unspecified: Secondary | ICD-10-CM

## 2020-08-28 NOTE — Therapy (Signed)
[facility] [address], [place], [address] Phone: [phone]   Fax:  [phone]  Physical Therapy Treatment  Patient Details  Name: Lindsay Fields MRN: [id number] Date of Birth: August 10, 1951 Referring Provider (PT): Harvest Dark, FNP   Encounter Date: 08/28/2020   PT End of Session - 08/28/20 1303    Visit Number 11    Number of Visits 13    Date for PT Re-Evaluation 09/02/20    Authorization Type 1/10    PT Start Time 1300    PT Stop Time 1345    PT Time Calculation (min) 45 min    Activity Tolerance Patient tolerated treatment well;No increased pain    Behavior During Therapy WFL for tasks assessed/performed           Past Medical History:  Diagnosis Date  . Allergic genetic state   . Bladder polyps   . Dysrhythmia    PVC's  . Fibrocystic breast disease   . Frequent UTI   . H/O degenerative disc disease   . Hypercholesterolemia   . Osteoporosis     Past Surgical History:  Procedure Laterality Date  . ABDOMINAL HYSTERECTOMY    . BLADDER FULGURATION    . COLONOSCOPY    . COLONOSCOPY WITH PROPOFOL N/A 07/12/2018   Procedure: COLONOSCOPY WITH PROPOFOL;  Surgeon: Toledo, [name], MD;  Location: [facility];  Service: Gastroenterology;  Laterality: N/A;  . DILATION AND CURETTAGE OF UTERUS    . OVARIAN CYST REMOVAL    . TONSILLECTOMY AND ADENOIDECTOMY      There were no vitals filed for this visit.   Subjective Assessment - 08/28/20 1301    Subjective Patient reports the pain is decreasing and being able to come from sit to stand.    Pertinent History Chronic Hip Bursitis, Arthiritis, Osteoporosis    Limitations Lifting;Standing    How long can you sit comfortably? Unlimited    How long can you stand comfortably? ~15 minutes    How long can you walk comfortably? unlimited    Diagnostic tests MRI of Low back: DDD    Patient Stated Goals To be able to lift objects/grandchildren  without having pain    Currently in Pain? No/denies          Interventions    Therapeutic Exercise STS 2x12 #20 Woodchopper OMEGA 2x15 10# Standing Hip Extension at Hip Machine 3x15 70# Standing Hip Abduction at Hip Machine 3x15 55# SL Squat to Mat 3x12   SL DL 2x10 10#    Performed exercises to decrease increased spasms and pain     PT Education - 08/28/20 1302    Education Details Form/Technique with Exercise    Person(s) Educated Patient    Methods Explanation;Demonstration    Comprehension Verbalized understanding;Returned demonstration            PT Short Term Goals - 08/25/20 1305      PT SHORT TERM GOAL #1   Title Patient will demonstrate independence with HEP to maximize rehab potential.    Time 3    Period Weeks    Status Achieved    Target Date 08/12/20             PT Long Term Goals - 08/25/20 1305      PT LONG TERM GOAL #1   Title Patient will demonstrate independence with progressive HEP to maintain progress made during POC.    Time 6    Period  Weeks    Status On-going      PT LONG TERM GOAL #2   Title Patient will increase FOTO score to 60 to show an increase in patients ability to perform functional activities without current limitations.    Baseline 07/22/20: 49; 08/25/20: 57    Time 6    Period Weeks    Status On-going      PT LONG TERM GOAL #3   Title Patient will be able to lift objects/grandchildren up without pain to show an improvement in pain and strength of lower back.    Baseline Cannot Lift without pain increasing; 08/25/20: Less pain    Time 6    Period Weeks    Status On-going      PT LONG TERM GOAL #4   Title Patient will be able to cook for a few hours without pain to show an improvement in pain and strength of lower back.    Baseline Can only stand~1- 2 hours; 08/25/20: just stiffness no pain    Time 6    Period Weeks    Status Achieved      PT LONG TERM GOAL #5   Title Patient will improve 5xSTS to 12 seconds  or less to show improvement in strength, power, and falls risk.    Baseline 07/31/20: 16sec; 08/25/20: 11.8sec    Time 6    Period Weeks    Status Achieved                 Plan - 08/28/20 1325    Clinical Impression Statement Continued working on improving patient strength and decreasing symptoms during session.  Added in a rotational component with woodchoppers; patient tolerated exercises well with no increase in symptoms. Patient is making progress demonstrated by a decrease in symptoms and improvement in ability to come from sit to stand.  Patient will continue to benefit from skilled therapy to return to prior level of function.    Personal Factors and Comorbidities Age;Comorbidity 3+    Comorbidities Osteoporosis, Athritis, Chronic LBP/Hip Bursitis    Examination-Activity Limitations Stand    Examination-Participation Restrictions Community Activity    Stability/Clinical Decision Making Stable/Uncomplicated    Clinical Decision Making Low    Rehab Potential Good    PT Frequency 2x / week    PT Duration 6 weeks    PT Treatment/Interventions ADLs/Self Care Home Management;Biofeedback;Electrical Stimulation;Moist Heat;Traction;DME Instruction;Gait training;Stair training;Functional mobility training;Therapeutic activities;Therapeutic exercise;Balance training;Neuromuscular re-education;Patient/family education;Manual techniques;Passive range of motion;Dry needling;Energy conservation;Spinal Manipulations;Joint Manipulations    PT Next Visit Plan Strengthening and balance    PT Home Exercise Plan SLR, Standing Hip Abduction and Extension    Consulted and Agree with Plan of Care Patient           Patient will benefit from skilled therapeutic intervention in order to improve the following deficits and impairments:  Abnormal gait, Decreased balance, Decreased endurance, Difficulty walking, Increased muscle spasms, Decreased range of motion, Impaired perceived functional ability,  Improper body mechanics, Decreased activity tolerance, Decreased coordination, Decreased strength, Pain, Postural dysfunction  Visit Diagnosis: Chronic bilateral low back pain without sciatica  Muscle weakness (generalized)     Problem List There are no problems to display for this patient.  1:44 PM, 08/28/20 Margarito Liner, SPT Student Physical Therapist [place]  [phone]  Margarito Liner 08/28/2020, 1:26 PM  [facility] [address], [place], [address] Phone: [phone]   Fax:  [phone]  Name: Lindsay  HERO Fields MRN: [id number] Date of Birth: 07-23-1951

## 2020-09-02 ENCOUNTER — Other Ambulatory Visit: Payer: Self-pay

## 2020-09-02 ENCOUNTER — Ambulatory Visit: Payer: Medicare HMO

## 2020-09-02 DIAGNOSIS — M6281 Muscle weakness (generalized): Secondary | ICD-10-CM

## 2020-09-02 DIAGNOSIS — G8929 Other chronic pain: Secondary | ICD-10-CM

## 2020-09-02 DIAGNOSIS — M545 Low back pain, unspecified: Secondary | ICD-10-CM | POA: Diagnosis not present

## 2020-09-02 NOTE — Therapy (Signed)
[facility] [address] [place], [address] Phone: [phone]   Fax:  [phone]  Physical Therapy Treatment  Patient Details  Name: Lindsay Fields MRN: [id number] Date of Birth: November 01, 1950 Referring Provider (PT): Harvest Dark, FNP   Encounter Date: 09/02/2020   PT End of Session - 09/02/20 1358    Visit Number 12    Number of Visits 13    Date for PT Re-Evaluation 09/02/20    Authorization Type 2/10    PT Start Time 6712    PT Stop Time 1430    PT Time Calculation (min) 45 min    Activity Tolerance Patient tolerated treatment well;No increased pain    Behavior During Therapy WFL for tasks assessed/performed           Past Medical History:  Diagnosis Date  . Allergic genetic state   . Bladder polyps   . Dysrhythmia    PVC's  . Fibrocystic breast disease   . Frequent UTI   . H/O degenerative disc disease   . Hypercholesterolemia   . Osteoporosis     Past Surgical History:  Procedure Laterality Date  . ABDOMINAL HYSTERECTOMY    . BLADDER FULGURATION    . COLONOSCOPY    . COLONOSCOPY WITH PROPOFOL N/A 07/12/2018   Procedure: COLONOSCOPY WITH PROPOFOL;  Surgeon: Toledo, [name], MD;  Location: [facility];  Service: Gastroenterology;  Laterality: N/A;  . DILATION AND CURETTAGE OF UTERUS    . OVARIAN CYST REMOVAL    . TONSILLECTOMY AND ADENOIDECTOMY      There were no vitals filed for this visit.   Subjective Assessment - 09/02/20 1350    Subjective Patient states she had a good weekend, states she trialed stopping taking her medication.    Pertinent History Chronic Hip Bursitis, Arthiritis, Osteoporosis    Limitations Lifting;Standing    How long can you sit comfortably? Unlimited    How long can you stand comfortably? ~15 minutes    How long can you walk comfortably? unlimited    Diagnostic tests MRI of Low back: DDD    Patient Stated Goals To be able to lift  objects/grandchildren without having pain    Currently in Pain? No/denies               Therapeutic Exercise STS 2x12 #20 SL DL 2x10 30# Standing Hip Extension at Hip Machine 2 x 25 70# KB swings in standing 30# 2 x 20 Monster walks in standing with GTB around ankles -- 2 x 10ft Upward chops with use of grey TB -- 2 x 15     Performed exercises to decrease increased spasms and pain     PT Education - 09/02/20 1354    Education Details form/technique with exercise    Person(s) Educated Patient    Methods Explanation;Demonstration    Comprehension Verbalized understanding;Returned demonstration            PT Short Term Goals - 08/25/20 1305      PT SHORT TERM GOAL #1   Title Patient will demonstrate independence with HEP to maximize rehab potential.    Time 3    Period Weeks    Status Achieved    Target Date 08/12/20             PT Long Term Goals - 08/25/20 1305      PT LONG TERM GOAL #1   Title Patient will demonstrate independence with progressive HEP  to maintain progress made during POC.    Time 6    Period Weeks    Status On-going      PT LONG TERM GOAL #2   Title Patient will increase FOTO score to 60 to show an increase in patients ability to perform functional activities without current limitations.    Baseline 07/22/20: 49; 08/25/20: 57    Time 6    Period Weeks    Status On-going      PT LONG TERM GOAL #3   Title Patient will be able to lift objects/grandchildren up without pain to show an improvement in pain and strength of lower back.    Baseline Cannot Lift without pain increasing; 08/25/20: Less pain    Time 6    Period Weeks    Status On-going      PT LONG TERM GOAL #4   Title Patient will be able to cook for a few hours without pain to show an improvement in pain and strength of lower back.    Baseline Can only stand~1- 2 hours; 08/25/20: just stiffness no pain    Time 6    Period Weeks    Status Achieved      PT LONG TERM GOAL  #5   Title Patient will improve 5xSTS to 12 seconds or less to show improvement in strength, power, and falls risk.    Baseline 07/31/20: 16sec; 08/25/20: 11.8sec    Time 6    Period Weeks    Status Achieved                 Plan - 09/02/20 1358    Clinical Impression Statement Increased difficulty with performing squatting motions today, however, able to perform deadlift with greater amount of resistance compared to previous treatment sessions. Patient continues to have decreased mobility with performing deep squatting motions secondary to strength limitations. Patient will benefit from further skilled therapy to return to prior level of function.    Personal Factors and Comorbidities Age;Comorbidity 3+    Comorbidities Osteoporosis, Athritis, Chronic LBP/Hip Bursitis    Examination-Activity Limitations Stand    Examination-Participation Restrictions Community Activity    Stability/Clinical Decision Making Stable/Uncomplicated    Rehab Potential Good    PT Frequency 2x / week    PT Duration 6 weeks    PT Treatment/Interventions ADLs/Self Care Home Management;Biofeedback;Electrical Stimulation;Moist Heat;Traction;DME Instruction;Gait training;Stair training;Functional mobility training;Therapeutic activities;Therapeutic exercise;Balance training;Neuromuscular re-education;Patient/family education;Manual techniques;Passive range of motion;Dry needling;Energy conservation;Spinal Manipulations;Joint Manipulations    PT Next Visit Plan Strengthening and balance    PT Home Exercise Plan SLR, Standing Hip Abduction and Extension    Consulted and Agree with Plan of Care Patient           Patient will benefit from skilled therapeutic intervention in order to improve the following deficits and impairments:  Abnormal gait, Decreased balance, Decreased endurance, Difficulty walking, Increased muscle spasms, Decreased range of motion, Impaired perceived functional ability, Improper body  mechanics, Decreased activity tolerance, Decreased coordination, Decreased strength, Pain, Postural dysfunction  Visit Diagnosis: Chronic bilateral low back pain without sciatica  Muscle weakness (generalized)     Problem List There are no problems to display for this patient.   Blythe Stanford, PT DPT 09/02/2020, 2:11 PM  [facility] [facility] [address] [place], [address] Phone: [phone]   Fax:  [phone]  Name: CHAREESE SERGENT MRN: [id number] Date of Birth: 1951/02/10

## 2020-09-11 ENCOUNTER — Ambulatory Visit: Payer: Medicare HMO

## 2020-09-15 ENCOUNTER — Other Ambulatory Visit: Payer: Self-pay

## 2020-09-15 ENCOUNTER — Ambulatory Visit: Payer: Medicare HMO | Attending: Family Medicine

## 2020-09-15 DIAGNOSIS — M6281 Muscle weakness (generalized): Secondary | ICD-10-CM

## 2020-09-15 DIAGNOSIS — M545 Low back pain, unspecified: Secondary | ICD-10-CM | POA: Insufficient documentation

## 2020-09-15 DIAGNOSIS — G8929 Other chronic pain: Secondary | ICD-10-CM | POA: Diagnosis present

## 2020-09-15 NOTE — Therapy (Signed)
[facility] [address], [place], [address] Phone: [phone]   Fax:  [phone]  Physical Therapy Treatment  Patient Details  Name: Lindsay Fields MRN: [id number] Date of Birth: Feb 16, 1951 Referring Provider (PT): Harvest Dark, FNP   Encounter Date: 09/15/2020   PT End of Session - 09/15/20 1659    Visit Number 13    Number of Visits 13    Date for PT Re-Evaluation 09/22/20    PT Start Time 7106    PT Stop Time 2694    PT Time Calculation (min) 32 min    Activity Tolerance Patient tolerated treatment well;No increased pain    Behavior During Therapy WFL for tasks assessed/performed           Past Medical History:  Diagnosis Date  . Allergic genetic state   . Bladder polyps   . Dysrhythmia    PVC's  . Fibrocystic breast disease   . Frequent UTI   . H/O degenerative disc disease   . Hypercholesterolemia   . Osteoporosis     Past Surgical History:  Procedure Laterality Date  . ABDOMINAL HYSTERECTOMY    . BLADDER FULGURATION    . COLONOSCOPY    . COLONOSCOPY WITH PROPOFOL N/A 07/12/2018   Procedure: COLONOSCOPY WITH PROPOFOL;  Surgeon: Toledo, [name], MD;  Location: [facility];  Service: Gastroenterology;  Laterality: N/A;  . DILATION AND CURETTAGE OF UTERUS    . OVARIAN CYST REMOVAL    . TONSILLECTOMY AND ADENOIDECTOMY      There were no vitals filed for this visit.   Subjective Assessment - 09/15/20 1655    Subjective Pt reports a recent incidious increase in left foot pain, worries itmay be a redo of a stress fracture. She is scheduled to see her podiatrist for this issue soon.    Pertinent History Chronic Hip Bursitis, Arthiritis, Osteoporosis    Currently in Pain? Yes    Pain Score 5     Pain Location Back   near left PSIS           INTERVENTION THIS DATE:  -STS: 1x15 #20 chair + airex; 2x8 from just chair  -Hip Extension machine 70# 1x20 bilat -Hip ABDCT 1x20  bilat -SLS 1x10 bilat (~3-5secH)        PT Short Term Goals - 08/25/20 1305      PT SHORT TERM GOAL #1   Title Patient will demonstrate independence with HEP to maximize rehab potential.    Time 3    Period Weeks    Status Achieved    Target Date 08/12/20             PT Long Term Goals - 08/25/20 1305      PT LONG TERM GOAL #1   Title Patient will demonstrate independence with progressive HEP to maintain progress made during POC.    Time 6    Period Weeks    Status On-going      PT LONG TERM GOAL #2   Title Patient will increase FOTO score to 60 to show an increase in patients ability to perform functional activities without current limitations.    Baseline 07/22/20: 49; 08/25/20: 57    Time 6    Period Weeks    Status On-going      PT LONG TERM GOAL #3   Title Patient will be able to lift objects/grandchildren up without pain to show an improvement in pain and strength  of lower back.    Baseline Cannot Lift without pain increasing; 08/25/20: Less pain    Time 6    Period Weeks    Status On-going      PT LONG TERM GOAL #4   Title Patient will be able to cook for a few hours without pain to show an improvement in pain and strength of lower back.    Baseline Can only stand~1- 2 hours; 08/25/20: just stiffness no pain    Time 6    Period Weeks    Status Achieved      PT LONG TERM GOAL #5   Title Patient will improve 5xSTS to 12 seconds or less to show improvement in strength, power, and falls risk.    Baseline 07/31/20: 16sec; 08/25/20: 11.8sec    Time 6    Period Weeks    Status Achieved                 Plan - 09/15/20 1704    Clinical Impression Statement Pt tolerates session this date without any exacerbation of pain or other symptoms. Pt provided adequate rest intervals as indicated by pt report. Pt continued to present with weakness and impaired motor control, but is showing signs of gradual improvement this session AEB progression in either  resistance or intensity of various parts of program. Pt remains motivated and focused, but does requires multimodal cues intermittently for correct performance of many interventions. Avoided certain stepping exercises at request of patient due to new onset foot pain, concern over stress fracture.    Personal Factors and Comorbidities Age;Comorbidity 3+    Comorbidities Osteoporosis, Athritis, Chronic LBP/Hip Bursitis    Examination-Activity Limitations Stand    Examination-Participation Restrictions Community Activity    Stability/Clinical Decision Making Stable/Uncomplicated    Clinical Decision Making Low    Rehab Potential Good    PT Frequency 2x / week    PT Duration 6 weeks    PT Treatment/Interventions ADLs/Self Care Home Management;Biofeedback;Electrical Stimulation;Moist Heat;Traction;DME Instruction;Gait training;Stair training;Functional mobility training;Therapeutic activities;Therapeutic exercise;Balance training;Neuromuscular re-education;Patient/family education;Manual techniques;Passive range of motion;Dry needling;Energy conservation;Spinal Manipulations;Joint Manipulations    PT Next Visit Plan FInal updates for DC    PT Home Exercise Plan SLR, Standing Hip Abduction and Extension    Consulted and Agree with Plan of Care Patient           Patient will benefit from skilled therapeutic intervention in order to improve the following deficits and impairments:  Abnormal gait, Decreased balance, Decreased endurance, Difficulty walking, Increased muscle spasms, Decreased range of motion, Impaired perceived functional ability, Improper body mechanics, Decreased activity tolerance, Decreased coordination, Decreased strength, Pain, Postural dysfunction  Visit Diagnosis: Chronic bilateral low back pain without sciatica  Muscle weakness (generalized)     Problem List There are no problems to display for this patient.  5:15 PM, 09/15/20 Etta Grandchild, PT, DPT Physical  Therapist - [place] [phone] (Office)    Keygan Dumond C 09/15/2020, 5:10 PM  [facility] [address], [place], [address] Phone: [phone]   Fax:  [phone]  Name: NORELY SCHLICK MRN: [id number] Date of Birth: 1950/10/17

## 2020-09-24 ENCOUNTER — Ambulatory Visit: Payer: Medicare HMO

## 2020-09-24 ENCOUNTER — Other Ambulatory Visit: Payer: Self-pay

## 2020-09-24 DIAGNOSIS — M6281 Muscle weakness (generalized): Secondary | ICD-10-CM

## 2020-09-24 DIAGNOSIS — M545 Low back pain, unspecified: Secondary | ICD-10-CM | POA: Diagnosis not present

## 2020-09-24 DIAGNOSIS — G8929 Other chronic pain: Secondary | ICD-10-CM

## 2020-09-24 NOTE — Therapy (Signed)
[facility] [address], [place], [address] Phone: [phone]   Fax:  [phone]  Physical Therapy Treatment  Patient Details  Name: Lindsay Fields MRN: [id number] Date of Birth: [birth year] Referring Provider (PT): Harvest Dark, FNP   Encounter Date: 09/24/2020   PT End of Session - 09/24/20 1221    Visit Number 14    Number of Visits 15    Date for PT Re-Evaluation 09/22/20    PT Start Time 1115    PT Stop Time 1200    PT Time Calculation (min) 45 min    Activity Tolerance Patient tolerated treatment well;No increased pain    Behavior During Therapy WFL for tasks assessed/performed           Past Medical History:  Diagnosis Date  . Allergic genetic state   . Bladder polyps   . Dysrhythmia    PVC's  . Fibrocystic breast disease   . Frequent UTI   . H/O degenerative disc disease   . Hypercholesterolemia   . Osteoporosis     Past Surgical History:  Procedure Laterality Date  . ABDOMINAL HYSTERECTOMY    . BLADDER FULGURATION    . COLONOSCOPY    . COLONOSCOPY WITH PROPOFOL N/A 07/12/2018   Procedure: COLONOSCOPY WITH PROPOFOL;  Surgeon: Toledo, [name], MD;  Location: [facility];  Service: Gastroenterology;  Laterality: N/A;  . DILATION AND CURETTAGE OF UTERUS    . OVARIAN CYST REMOVAL    . TONSILLECTOMY AND ADENOIDECTOMY      There were no vitals filed for this visit.   Subjective Assessment - 09/24/20 1133    Subjective Pt reports she conitnues to have increased pain with going from sitting to standing most notably from a low surface.    Pertinent History Chronic Hip Bursitis, Arthiritis, Osteoporosis    Currently in Pain? Yes    Pain Score 4     Pain Location Back    Pain Orientation Left;Lower    Pain Descriptors / Indicators Aching    Pain Type Chronic pain    Pain Onset More than a month ago              Therapeutic Exercise   STS x6 from low sitting position  with airex pad under feet Seated hip abduction/ER with patient BTB - x 20  STS - x 12 with BTB around knees Lean forwardwith digging heels and shifting weight forward - x 20  Hip circles with BTB - 2x20 KB swings in standing 30# 2 x 20 Upward chops with use of 10# db -- 2 x 15      Performed exercises to decrease increased spasms and pain            PT Education - 09/24/20 1220    Education Details form/technique with exercise    Person(s) Educated Patient    Methods Explanation;Demonstration    Comprehension Verbalized understanding;Returned demonstration            PT Short Term Goals - 08/25/20 1305      PT SHORT TERM GOAL #1   Title Patient will demonstrate independence with HEP to maximize rehab potential.    Time 3    Period Weeks    Status Achieved    Target Date 08/12/20             PT Long Term Goals - 08/25/20 1305      PT LONG TERM GOAL #1  Title Patient will demonstrate independence with progressive HEP to maintain progress made during POC.    Time 6    Period Weeks    Status On-going      PT LONG TERM GOAL #2   Title Patient will increase FOTO score to 60 to show an increase in patients ability to perform functional activities without current limitations.    Baseline 07/22/20: 49; 08/25/20: 57    Time 6    Period Weeks    Status On-going      PT LONG TERM GOAL #3   Title Patient will be able to lift objects/grandchildren up without pain to show an improvement in pain and strength of lower back.    Baseline Cannot Lift without pain increasing; 08/25/20: Less pain    Time 6    Period Weeks    Status On-going      PT LONG TERM GOAL #4   Title Patient will be able to cook for a few hours without pain to show an improvement in pain and strength of lower back.    Baseline Can only stand~1- 2 hours; 08/25/20: just stiffness no pain    Time 6    Period Weeks    Status Achieved      PT LONG TERM GOAL #5   Title Patient will improve 5xSTS to  12 seconds or less to show improvement in strength, power, and falls risk.    Baseline 07/31/20: 16sec; 08/25/20: 11.8sec    Time 6    Period Weeks    Status Achieved                 Plan - 09/24/20 1223    Clinical Impression Statement Conitnued to focus on improving sit to stand ability with increased focus today on performing from  lowerer surfaces.  Patient continues to demonstrate decreased hip activation with sit to stand activities indicating poor motor control. Patient will benefit from further skilled therapy to return to prior level of function.    Personal Factors and Comorbidities Age;Comorbidity 3+    Comorbidities Osteoporosis, Athritis, Chronic LBP/Hip Bursitis    Examination-Activity Limitations Stand    Examination-Participation Restrictions Community Activity    Stability/Clinical Decision Making Stable/Uncomplicated    Rehab Potential Good    PT Frequency 2x / week    PT Duration 6 weeks    PT Treatment/Interventions ADLs/Self Care Home Management;Biofeedback;Electrical Stimulation;Moist Heat;Traction;DME Instruction;Gait training;Stair training;Functional mobility training;Therapeutic activities;Therapeutic exercise;Balance training;Neuromuscular re-education;Patient/family education;Manual techniques;Passive range of motion;Dry needling;Energy conservation;Spinal Manipulations;Joint Manipulations    PT Next Visit Plan FInal updates for DC    PT Home Exercise Plan SLR, Standing Hip Abduction and Extension    Consulted and Agree with Plan of Care Patient           Patient will benefit from skilled therapeutic intervention in order to improve the following deficits and impairments:  Abnormal gait,Decreased balance,Decreased endurance,Difficulty walking,Increased muscle spasms,Decreased range of motion,Impaired perceived functional ability,Improper body mechanics,Decreased activity tolerance,Decreased coordination,Decreased strength,Pain,Postural  dysfunction  Visit Diagnosis: Chronic bilateral low back pain without sciatica  Muscle weakness (generalized)     Problem List There are no problems to display for this patient.   Blythe Stanford, PT DPT 09/24/2020, 12:26 PM  [facility] [address] [place], [address] Phone: [phone]   Fax:  [phone]  Name: Lindsay Fields MRN: [id number] Date of Birth: October 22, 1950

## 2020-10-01 ENCOUNTER — Ambulatory Visit: Payer: Medicare HMO

## 2020-10-01 ENCOUNTER — Other Ambulatory Visit: Payer: Self-pay

## 2020-10-01 DIAGNOSIS — M545 Low back pain, unspecified: Secondary | ICD-10-CM | POA: Diagnosis not present

## 2020-10-01 DIAGNOSIS — M6281 Muscle weakness (generalized): Secondary | ICD-10-CM

## 2020-10-01 NOTE — Therapy (Signed)
[facility] [address], [place], [address] Phone: [phone]   Fax:  [phone]  Physical Therapy Treatment/ Progress Note  Patient Details  Name: Lindsay Fields MRN: [id number] Date of Birth: 1951/01/10 Referring Provider (PT): Harvest Dark, FNP   Encounter Date: 10/01/2020   PT End of Session - 10/01/20 1135    Visit Number 15    Number of Visits 15    Date for PT Re-Evaluation 09/22/20    PT Start Time 1115    PT Stop Time 1200    PT Time Calculation (min) 45 min    Activity Tolerance Patient tolerated treatment well;No increased pain    Behavior During Therapy WFL for tasks assessed/performed           Past Medical History:  Diagnosis Date   Allergic genetic state    Bladder polyps    Dysrhythmia    PVC's   Fibrocystic breast disease    Frequent UTI    H/O degenerative disc disease    Hypercholesterolemia    Osteoporosis     Past Surgical History:  Procedure Laterality Date   ABDOMINAL HYSTERECTOMY     BLADDER FULGURATION     COLONOSCOPY     COLONOSCOPY WITH PROPOFOL N/A 07/12/2018   Procedure: COLONOSCOPY WITH PROPOFOL;  Surgeon: Toledo, [name], MD;  Location: [facility];  Service: Gastroenterology;  Laterality: N/A;   DILATION AND CURETTAGE OF UTERUS     OVARIAN CYST REMOVAL     TONSILLECTOMY AND ADENOIDECTOMY      There were no vitals filed for this visit.   Subjective Assessment - 10/01/20 1545    Subjective Patient reports she would like to discharge during today's session and perform exercises at home. Patient reports she feels much stronger since beginning therapy.    Pertinent History Chronic Hip Bursitis, Arthiritis, Osteoporosis    Currently in Pain? Yes    Pain Score 4     Pain Location Back    Pain Orientation Left;Lower    Pain Descriptors / Indicators Aching    Pain Type Chronic pain    Pain Onset More than a month ago             TREATMENT Therapeutic Exercise Lunges in standing with UE support - x 20 B Hip abduction - x 20 in standing Hip circles with BTB - x 20  Seated ball push outs with mini squats - 3 x 20   Performed to add to HEP      PT Education - 10/01/20 1546    Education Details form/technique with exericse    Person(s) Educated Patient    Methods Explanation;Demonstration    Comprehension Verbalized understanding;Returned demonstration            PT Short Term Goals - 08/25/20 1305      PT SHORT TERM GOAL #1   Title Patient will demonstrate independence with HEP to maximize rehab potential.    Time 3    Period Weeks    Status Achieved    Target Date 08/12/20             PT Long Term Goals - 10/01/20 1119      PT LONG TERM GOAL #1   Title Patient will demonstrate independence with progressive HEP to maintain progress made during POC.    Baseline independent    Time 6    Period Weeks    Status Achieved  PT LONG TERM GOAL #2   Title Patient will increase FOTO score to 60 to show an increase in patients ability to perform functional activities without current limitations.    Baseline 07/22/20: 49; 08/25/20: 57; 10/01/2020: 53    Time 6    Period Weeks    Status Partially Met      PT LONG TERM GOAL #3   Title Patient will be able to lift objects/grandchildren up without pain to show an improvement in pain and strength of lower back.    Baseline Cannot Lift without pain increasing; 08/25/20: Less pain; able to lift 40# with less pain    Time 6    Period Weeks    Status Achieved      PT LONG TERM GOAL #4   Title Patient will be able to cook for a few hours without pain to show an improvement in pain and strength of lower back.    Baseline Can only stand~1- 2 hours; 08/25/20: just stiffness no pain    Time 6    Period Weeks    Status Achieved      PT LONG TERM GOAL #5   Title Patient will improve 5xSTS to 12 seconds or less to show improvement in strength, power, and  falls risk.    Baseline 07/31/20: 16sec; 08/25/20: 11.8sec    Time 6    Period Weeks    Status Achieved                 Plan - 10/01/20 1547    Clinical Impression Statement Patient has met or partially met all long term goals during today's session, all functional goals have been met, however, she continued to have a decreased FOTO score compared to her previous intake. Focused on improving functional LE strength during the session. Patient will benefit from further skilled therapy to return to prior level of function.    Personal Factors and Comorbidities Age;Comorbidity 3+    Comorbidities Osteoporosis, Athritis, Chronic LBP/Hip Bursitis    Examination-Activity Limitations Stand    Examination-Participation Restrictions Community Activity    Stability/Clinical Decision Making Stable/Uncomplicated    Rehab Potential Good    PT Frequency 2x / week    PT Duration 6 weeks    PT Treatment/Interventions ADLs/Self Care Home Management;Biofeedback;Electrical Stimulation;Moist Heat;Traction;DME Instruction;Gait training;Stair training;Functional mobility training;Therapeutic activities;Therapeutic exercise;Balance training;Neuromuscular re-education;Patient/family education;Manual techniques;Passive range of motion;Dry needling;Energy conservation;Spinal Manipulations;Joint Manipulations    PT Next Visit Plan FInal updates for DC    PT Home Exercise Plan SLR, Standing Hip Abduction and Extension    Consulted and Agree with Plan of Care Patient           Patient will benefit from skilled therapeutic intervention in order to improve the following deficits and impairments:  Abnormal gait,Decreased balance,Decreased endurance,Difficulty walking,Increased muscle spasms,Decreased range of motion,Impaired perceived functional ability,Improper body mechanics,Decreased activity tolerance,Decreased coordination,Decreased strength,Pain,Postural dysfunction  Visit Diagnosis: Chronic bilateral low  back pain without sciatica  Muscle weakness (generalized)     Problem List There are no problems to display for this patient.   Blythe Stanford, PT DPT 10/01/2020, 3:57 PM  [facility] [address] [place], [address] Phone: [phone]   Fax:  [phone]  Name: MORIAH SHAWLEY MRN: [id number] Date of Birth: 1950/12/31

## 2021-05-22 ENCOUNTER — Other Ambulatory Visit: Payer: Self-pay | Admitting: Internal Medicine

## 2021-05-22 DIAGNOSIS — Z1231 Encounter for screening mammogram for malignant neoplasm of breast: Secondary | ICD-10-CM

## 2021-06-08 ENCOUNTER — Ambulatory Visit
Admission: RE | Admit: 2021-06-08 | Discharge: 2021-06-08 | Disposition: A | Discharge status: Home / Self Care | Payer: Medicare HMO | Source: Ambulatory Visit | Attending: Internal Medicine | Admitting: Internal Medicine

## 2021-06-08 ENCOUNTER — Other Ambulatory Visit: Payer: Self-pay

## 2021-06-08 DIAGNOSIS — Z1231 Encounter for screening mammogram for malignant neoplasm of breast: Secondary | ICD-10-CM | POA: Diagnosis present

## 2021-08-21 ENCOUNTER — Other Ambulatory Visit: Payer: Self-pay | Admitting: Family Medicine

## 2021-08-21 DIAGNOSIS — M5416 Radiculopathy, lumbar region: Secondary | ICD-10-CM

## 2021-08-26 ENCOUNTER — Ambulatory Visit
Admission: RE | Admit: 2021-08-26 | Discharge: 2021-08-26 | Disposition: A | Discharge status: Home / Self Care | Payer: Medicare HMO | Source: Ambulatory Visit | Attending: Family Medicine | Admitting: Family Medicine

## 2021-08-26 ENCOUNTER — Other Ambulatory Visit: Payer: Self-pay

## 2021-08-26 DIAGNOSIS — M5416 Radiculopathy, lumbar region: Secondary | ICD-10-CM | POA: Insufficient documentation

## 2021-09-21 ENCOUNTER — Ambulatory Visit
Admission: RE | Admit: 2021-09-21 | Discharge: 2021-09-21 | Disposition: A | Discharge status: Home / Self Care | Payer: Medicare HMO | Source: Ambulatory Visit | Attending: Student in an Organized Health Care Education/Training Program | Admitting: Student in an Organized Health Care Education/Training Program

## 2021-09-21 ENCOUNTER — Encounter: Payer: Self-pay | Admitting: Student in an Organized Health Care Education/Training Program

## 2021-09-21 ENCOUNTER — Other Ambulatory Visit: Payer: Self-pay

## 2021-09-21 ENCOUNTER — Ambulatory Visit: Payer: Medicare HMO | Admitting: Student in an Organized Health Care Education/Training Program

## 2021-09-21 VITALS — BP 133/68 | HR 66 | Temp 97.3°F | Resp 16 | Ht 66.0 in | Wt 163.0 lb

## 2021-09-21 DIAGNOSIS — M47816 Spondylosis without myelopathy or radiculopathy, lumbar region: Secondary | ICD-10-CM | POA: Insufficient documentation

## 2021-09-21 DIAGNOSIS — G894 Chronic pain syndrome: Secondary | ICD-10-CM | POA: Insufficient documentation

## 2021-09-21 NOTE — Progress Notes (Signed)
Patient: Lindsay Fields  Service Category: E/M  Provider: Gillis Santa, MD  DOB: 06/20/1951  DOS: 09/21/2021  Referring Provider: Harvest Dark, FNP  MRN: [id number]  Setting: Ambulatory outpatient  PCP: Kirk Ruths, MD  Type: New Patient  Specialty: Interventional Pain Management    Location: Office  Delivery: Face-to-face     Primary Reason(s) for Visit: Encounter for initial evaluation of one or more chronic problems (new to examiner) potentially causing chronic pain, and posing a threat to normal musculoskeletal function. (Level of risk: High) CC: Back Pain (lower)  HPI  Lindsay Fields is a 70 y.o. year old, female patient, who comes for the first time to our practice referred by Meeler, Sherren Kerns, FNP for our initial evaluation of her chronic pain. She has Lumbar facet arthropathy on their problem list. Today she comes in for evaluation of her Back Pain (lower)  Pain Assessment: Location:   Back Radiating: pain radiaties down her left leg to the outside of her calf and at times left groins Onset: More than a month ago Duration: Chronic pain Quality: Aching, Throbbing, Constant Severity: 5 /10 (subjective, self-reported pain score)  Effect on ADL: limits my daily activities Timing: Constant Modifying factors: heating pad, tylenol and meds BP: 133/68  HR: 66  Onset and Duration: Gradual and Present longer than 3 months Cause of pain:  "degenerative" Severity: Getting worse, NAS-11 at its worse: 8/10, NAS-11 at its best: 4/10, NAS-11 now: 7/10, and NAS-11 on the average: 7/10 Timing: Not influenced by the time of the day, During activity or exercise, and After activity or exercise Aggravating Factors: Bending, Lifiting, Prolonged sitting, Prolonged standing, Squatting, and Stooping  Alleviating Factors: Hot packs, Lying down, and Medications Associated Problems: Dizziness, Fatigue, Weakness, and Pain that wakes patient up Quality of Pain: Aching, Constant, Distressing, Getting  longer, and Tiring Previous Examinations or Tests: MRI scan, Nerve block, and Neurosurgical evaluation Previous Treatments: Epidural steroid injections, Facet blocks, and Steroid treatments by mouth  Lindsay Fields is a pleasant 70 year old female who presents with a chief complaint of left low back pain with radiation into left buttock.  This is worse with position changes and going from sitting to standing.  She has had epidural steroid injections, physical therapy, diagnostic lumbar facet medial branch nerve blocks as well as lumbar radiofrequency ablation.  This was done at [facility] clinic.  She is being referred here for consideration of lumbar stimulation.  She has been evaluated by neurosurgery concerning her left L5-S1 synovial cyst.  Surgery was not recommended as there is no evidence of change in lumbar alignment.  Of note her procedural history includes:  07/22/2021: Left SI joint injection (no relief) 06/24/2021: Bilateral S1 transforaminal ESI (no relief) 06/10/2021: LEFT S1 trigger point injection (no relief) 04/08/2021: RFA to the bilateral L4-5 and L5-S1 facet joints (no relief)  03/02/2021: MBB to the bilateral L4-5 and L5-S1 facet joints (8/10 to 1/10) 02/13/2021: MBB to the bilateral L4-5 and L5-S1 facet joints (8/10 to 1/10) 06/23/2020: Bilateral L4 and bilateral L5 trigger point injections (3 days relief)   Meds   Current Outpatient Medications:    acetaminophen (TYLENOL) 500 MG tablet, Take 1,000 mg by mouth every 6 (six) hours as needed., Disp: , Rfl:    Azelastine HCl 137 MCG/SPRAY SOLN, Place 137 mcg into the nose., Disp: , Rfl:    bisoprolol (ZEBETA) 5 MG tablet, Take 5 mg by mouth daily., Disp: , Rfl:    EPINEPHrine 0.3 MG/0.3ML SOSY, Inject 0.3 mg  as directed., Disp: , Rfl:    fluticasone (FLONASE) 50 MCG/ACT nasal spray, Place 2 sprays into both nostrils 2 (two) times daily as needed for allergies or rhinitis., Disp: , Rfl:    gabapentin (NEURONTIN) 100 MG capsule, Take 100  mg by mouth at bedtime., Disp: , Rfl:    levocetirizine (XYZAL) 5 MG tablet, Take 5 mg by mouth every evening., Disp: , Rfl:    methocarbamol (ROBAXIN) 500 MG tablet, Take 500 mg by mouth at bedtime., Disp: , Rfl:    raloxifene (EVISTA) 60 MG tablet, Take 60 mg by mouth daily., Disp: , Rfl:    topiramate (TOPAMAX) 25 MG tablet, Take 25 mg by mouth daily., Disp: , Rfl:   Imaging Review  Lumbar MR wo contrast: Results for orders placed during the hospital encounter of 08/26/21  MR LUMBAR SPINE WO CONTRAST  Narrative CLINICAL DATA:  Low back and bilateral hip pain that radiates down left foot for 10 years and notes progressively worse over the past 6 months.  EXAM: MRI LUMBAR SPINE WITHOUT CONTRAST  TECHNIQUE: Multiplanar, multisequence MR imaging of the lumbar spine was performed. No intravenous contrast was administered.  COMPARISON:  MR lumbar 06/12/2020.  FINDINGS: Segmentation:  Standard.  Alignment:  Physiologic.  Vertebrae: No fracture, evidence of discitis, or bone lesion. Mild reactive marrow edema associated with the left L5-S1 facet joint. Trace bilateral L5-S1 facet joint effusions.  Conus medullaris and cauda equina: Conus extends to the L1-2 level. Conus and cauda equina appear normal.  Paraspinal and other soft tissues: Negative.  Disc levels:  T12-L1: Unremarkable.  L1-L2: Disc desiccation with shallow right paracentral disc bulge. No foraminal or canal stenosis. Unchanged.  L2-L3: Unremarkable.  L3-L4: Disc desiccation with mild diffuse disc bulge. No foraminal or canal stenosis. Unchanged.  L4-L5: Disc desiccation with mild diffuse disc bulge. No foraminal or canal stenosis. Unchanged.  L5-S1: Mild disc bulge. Moderate bilateral facet arthropathy. New 10 x 8 mm subligamentous facet synovial cyst along the anteromedial aspect of the left L5-S1 facet joint with slight mass effect upon the thecal sac, although no resultant canal or subarticular  recess stenosis. Bilateral foramina remain patent.  IMPRESSION: 1. Moderate bilateral facet arthropathy at L5-S1 with new 10 x 8 mm subligamentous facet synovial cyst along the anteromedial aspect of the left L5-S1 facet joint with slight mass effect upon the thecal sac, although no resultant canal or subarticular recess stenosis. 2. Mild reactive marrow edema associated with the left L5-S1 facet joint, which can be a focal source of back pain. 3. No significant foraminal or canal stenosis at any level.   Electronically Signed By: Davina Poke D.O. On: 08/26/2021 15:08   Complexity Note: Imaging results reviewed. Results shared with Lindsay Fields, using Layman's terms.                         ROS  Cardiovascular: Abnormal heart rhythm Pulmonary or Respiratory: No reported pulmonary signs or symptoms such as wheezing and difficulty taking a deep full breath (Asthma), difficulty blowing air out (Emphysema), coughing up mucus (Bronchitis), persistent dry cough, or temporary stoppage of breathing during sleep Neurological: No reported neurological signs or symptoms such as seizures, abnormal skin sensations, urinary and/or fecal incontinence, being born with an abnormal open spine and/or a tethered spinal cord Psychological-Psychiatric: No reported psychological or psychiatric signs or symptoms such as difficulty sleeping, anxiety, depression, delusions or hallucinations (schizophrenial), mood swings (bipolar disorders) or suicidal ideations or attempts Gastrointestinal:  No reported gastrointestinal signs or symptoms such as vomiting or evacuating blood, reflux, heartburn, alternating episodes of diarrhea and constipation, inflamed or scarred liver, or pancreas or irrregular and/or infrequent bowel movements Genitourinary: Recurrent Urinary Tract infections Hematological: No reported hematological signs or symptoms such as prolonged bleeding, low or poor functioning platelets, bruising or  bleeding easily, hereditary bleeding problems, low energy levels due to low hemoglobin or being anemic Endocrine: No reported endocrine signs or symptoms such as high or low blood sugar, rapid heart rate due to high thyroid levels, obesity or weight gain due to slow thyroid or thyroid disease Rheumatologic: Joint aches and or swelling due to excess weight (Osteoarthritis) Musculoskeletal: Negative for myasthenia gravis, muscular dystrophy, multiple sclerosis or malignant hyperthermia Work History: Retired  Allergies  Lindsay Fields has No Known Allergies.  Laboratory Chemistry Profile   Renal No results found for: BUN, CREATININE, LABCREA, BCR, GFR, GFRAA, GFRNONAA, SPECGRAV, PHUR, PROTEINUR   Electrolytes No results found for: NA, K, CL, CALCIUM, MG, PHOS   Hepatic No results found for: AST, ALT, ALBUMIN, ALKPHOS, AMYLASE, LIPASE, AMMONIA   ID No results found for: LYMEIGGIGMAB, HIV, SARSCOV2NAA, STAPHAUREUS, MRSAPCR, HCVAB, PREGTESTUR, RMSFIGG, QFVRPH1IGG, QFVRPH2IGG, LYMEIGGIGMAB   Bone No results found for: VD25OH, [id number], [id number], [id number], 25OHVITD1, 25OHVITD2, 25OHVITD3, TESTOFREE, TESTOSTERONE   Endocrine No results found for: GLUCOSE, GLUCOSEU, HGBA1C, TSH, FREET4, TESTOFREE, TESTOSTERONE, SHBG, ESTRADIOL, ESTRADIOLPCT, ESTRADIOLFRE, LABPREG, ACTH, CRTSLPL, UCORFRPERLTR, UCORFRPERDAY, CORTISOLBASE, LABPREG   Neuropathy No results found for: VITAMINB12, FOLATE, HGBA1C, HIV   CNS No results found for: COLORCSF, APPEARCSF, RBCCOUNTCSF, WBCCSF, POLYSCSF, LYMPHSCSF, EOSCSF, PROTEINCSF, GLUCCSF, JCVIRUS, CSFOLI, IGGCSF, LABACHR, ACETBL, LABACHR, ACETBL   Inflammation (CRP: Acute  ESR: Chronic) No results found for: CRP, ESRSEDRATE, LATICACIDVEN   Rheumatology No results found for: RF, ANA, LABURIC, URICUR, LYMEIGGIGMAB, LYMEABIGMQN, HLAB27   Coagulation No results found for: INR, LABPROT, APTT, PLT, DDIMER, LABHEMA, VITAMINK1, AT3   Cardiovascular No results found for:  BNP, CKTOTAL, CKMB, TROPONINI, HGB, HCT, LABVMA, EPIRU, EPINEPH24HUR, NOREPRU, NOREPI24HUR, DOPARU, DOPAM24HRUR   Screening No results found for: SARSCOV2NAA, COVIDSOURCE, STAPHAUREUS, MRSAPCR, HCVAB, HIV, PREGTESTUR   Cancer No results found for: CEA, CA125, LABCA2   Allergens No results found for: ALMOND, APPLE, ASPARAGUS, AVOCADO, BANANA, BARLEY, BASIL, BAYLEAF, GREENBEAN, LIMABEAN, WHITEBEAN, BEEFIGE, REDBEET, BLUEBERRY, BROCCOLI, CABBAGE, MELON, CARROT, CASEIN, CASHEWNUT, CAULIFLOWER, CELERY     Note: Lab results reviewed.  PFSH  Drug: Lindsay Fields  reports no history of drug use. Alcohol:  reports current alcohol use of about 3.0 - 5.0 standard drinks per week. Tobacco:  reports that she has never smoked. She has never used smokeless tobacco. Medical:  has a past medical history of Allergic genetic state, Bladder polyps, Dysrhythmia, Fibrocystic breast disease, Frequent UTI, H/O degenerative disc disease, Hypercholesterolemia, and Osteoporosis. Family: family history includes Breast cancer (age of onset: 68) in her maternal aunt.  Past Surgical History:  Procedure Laterality Date   ABDOMINAL HYSTERECTOMY     BLADDER FULGURATION     COLONOSCOPY     COLONOSCOPY WITH PROPOFOL N/A 07/12/2018   Procedure: COLONOSCOPY WITH PROPOFOL;  Surgeon: Toledo, [name], MD;  Location: [facility];  Service: Gastroenterology;  Laterality: N/A;   DILATION AND CURETTAGE OF UTERUS     OVARIAN CYST REMOVAL     TONSILLECTOMY AND ADENOIDECTOMY     Active Ambulatory Problems    Diagnosis Date Noted   Lumbar facet arthropathy 09/21/2021   Resolved Ambulatory Problems    Diagnosis Date Noted   No Resolved Ambulatory Problems  Past Medical History:  Diagnosis Date   Allergic genetic state    Bladder polyps    Dysrhythmia    Fibrocystic breast disease    Frequent UTI    H/O degenerative disc disease    Hypercholesterolemia    Osteoporosis    Constitutional Exam  General appearance: Well  nourished, well developed, and well hydrated. In no apparent acute distress Vitals:   09/21/21 1300  BP: 133/68  Pulse: 66  Resp: 16  Temp: (!) 97.3 F (36.3 C)  SpO2: 98%  Weight: 163 lb (73.9 kg)  Height: 5' 6"  (1.676 m)   BMI Assessment: Estimated body mass index is 26.31 kg/m as calculated from the following:   Height as of this encounter: 5' 6"  (1.676 m).   Weight as of this encounter: 163 lb (73.9 kg).  BMI interpretation table: BMI level Category Range association with higher incidence of chronic pain  <18 kg/m2 Underweight   18.5-24.9 kg/m2 Ideal body weight   25-29.9 kg/m2 Overweight Increased incidence by 20%  30-34.9 kg/m2 Obese (Class I) Increased incidence by 68%  35-39.9 kg/m2 Severe obesity (Class II) Increased incidence by 136%  >40 kg/m2 Extreme obesity (Class III) Increased incidence by 254%   Patient's current BMI Ideal Body weight  Body mass index is 26.31 kg/m. Ideal body weight: 59.3 kg (130 lb 11.7 oz) Adjusted ideal body weight: 65.2 kg (143 lb 10.2 oz)   BMI Readings from Last 4 Encounters:  09/21/21 26.31 kg/m  07/12/18 26.79 kg/m   Wt Readings from Last 4 Encounters:  09/21/21 163 lb (73.9 kg)  07/12/18 166 lb (75.3 kg)    Psych/Mental status: Alert, oriented x 3 (person, place, & time)       Eyes: PERLA Respiratory: No evidence of acute respiratory distress  Thoracic Spine Area Exam  Skin & Axial Inspection: No masses, redness, or swelling Alignment: Symmetrical Functional ROM: Unrestricted ROM Stability: No instability detected Muscle Tone/Strength: Functionally intact. No obvious neuro-muscular anomalies detected. Sensory (Neurological): Unimpaired Muscle strength & Tone: No palpable anomalies Lumbar Spine Area Exam  Skin & Axial Inspection: No masses, redness, or swelling Alignment: Symmetrical Functional ROM: Pain restricted ROM affecting primarily the left Stability: No instability detected Muscle Tone/Strength: Functionally  intact. No obvious neuro-muscular anomalies detected. Sensory (Neurological): Musculoskeletal pain pattern/ arthropathic  Gait & Posture Assessment  Ambulation: Unassisted Gait: Relatively normal for age and body habitus Posture: WNL  Lower Extremity Exam    Side: Right lower extremity  Side: Left lower extremity  Stability: No instability observed          Stability: No instability observed          Skin & Extremity Inspection: Skin color, temperature, and hair growth are WNL. No peripheral edema or cyanosis. No masses, redness, swelling, asymmetry, or associated skin lesions. No contractures.  Skin & Extremity Inspection: Skin color, temperature, and hair growth are WNL. No peripheral edema or cyanosis. No masses, redness, swelling, asymmetry, or associated skin lesions. No contractures.  Functional ROM: Unrestricted ROM                  Functional ROM: Unrestricted ROM                  Muscle Tone/Strength: Functionally intact. No obvious neuro-muscular anomalies detected.  Muscle Tone/Strength: Functionally intact. No obvious neuro-muscular anomalies detected.  Sensory (Neurological): Unimpaired        Sensory (Neurological): Unimpaired        DTR: Patellar: deferred today  Achilles: deferred today Plantar: deferred today  DTR: Patellar: deferred today Achilles: deferred today Plantar: deferred today  Palpation: No palpable anomalies  Palpation: No palpable anomalies    Assessment  Primary Diagnosis & Pertinent Problem List: The primary encounter diagnosis was Lumbar facet arthropathy. 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  Visit Diagnosis (New problems to examiner): 1. 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 Chronic pain syndrome    Plan of Care (Initial workup plan)   Patient not a candidate for spinal cord stimulation  given no prior history of lumbar spine surgery for diagnosis of CRPS.  We discussed peripheral nerve stimulation of the lumbar spine.  Patient has a left L5-S1 facet synovial cyst that could be contributing to her symptoms.  I discussed a peripheral nerve stim procedure at the left L4 medial branch which will also cover her left L5-S1 facet related pain.  Risks and benefits of this procedure were discussed in great detail.  Patient was informed that she would have a stimulator in place for 60 days after which she would be removed and would not require a permanent implant.  This is in contrast to spinal cord stimulation which I do not recommend nor do I think would be covered by insurance for the patient given her past medical history.  I reviewed lead placement using a spine model with the patient and she is interested in proceeding.  We will plan for left L4 medial branch Sprint peripheral nerve stimulation after the new year.    Imaging Orders         DG PAIN CLINIC C-ARM 1-60 MIN NO REPORT      Procedure Orders         Peripheral Nerve Stimulation    Orders Placed This Encounter  Procedures   Peripheral Nerve Stimulation    Standing Status:   Future    Standing Expiration Date:   09/21/2022    Scheduling Instructions:     SPRINT L4 medial branch (LEFT)    Order Specific Question:   Where will this procedure be performed?    Answer:   [facility]   DG PAIN CLINIC C-ARM 1-60 MIN NO REPORT    Intraoperative interpretation by procedural physician at [place].    Standing Status:   Standing    Number of Occurrences:   1    Order Specific Question:   Reason for exam:    Answer:   Assistance in needle guidance and placement for procedures requiring needle placement in or near specific anatomical locations not easily accessible without such assistance.     Provider-requested follow-up: Return in about 4 weeks (around 10/19/2021) for SPRINT PNS (LEFT).  I spent a total of  60 minutes reviewing chart data, face-to-face evaluation with the patient, counseling and coordination of care as detailed above.   No future appointments.  Note by: Gillis Santa, MD Date: 09/21/2021; Time: 2:01 PM

## 2021-09-21 NOTE — Patient Instructions (Signed)
We'll plan for SPRINT PNS on your left L4 medial branch We'll provide you an antimicrobial solution and give you SPRINT brochure

## 2021-09-21 NOTE — Progress Notes (Signed)
Safety precautions to be maintained throughout the outpatient stay will include: orient to surroundings, keep bed in low position, maintain call bell within reach at all times, provide assistance with transfer out of bed and ambulation.  

## 2021-10-10 IMAGING — MR MR LUMBAR SPINE W/O CM
5 series · 32 of 48 positions shown · non-contrast
Comparison: None.

CLINICAL DATA: Chronic low back pain with bilateral buttock pain.

EXAM:
MRI LUMBAR SPINE WITHOUT CONTRAST
TECHNIQUE: Multiplanar, multisequence MR imaging of the lumbar spine was
performed. No intravenous contrast was administered.

[Series 5: T2 · sagittal · 4.0mm · 0.81mm/px · 7 of 17 slices shown (1 of 2)]
[im 1/17]
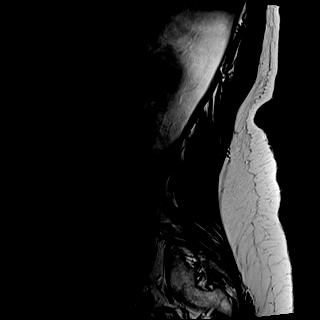
[im 3/17]
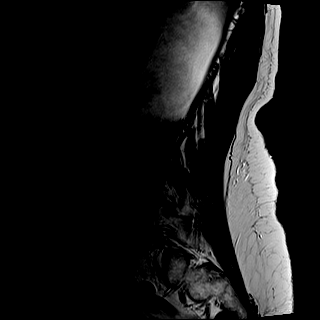
[im 6/17]
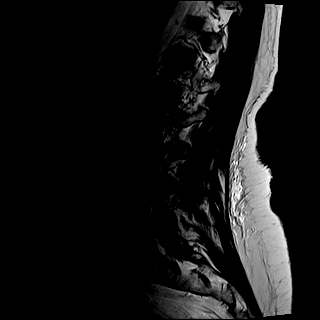
[im 9/17]
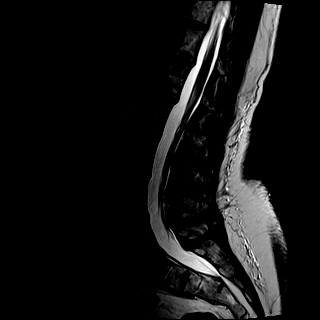
[im 11/17]
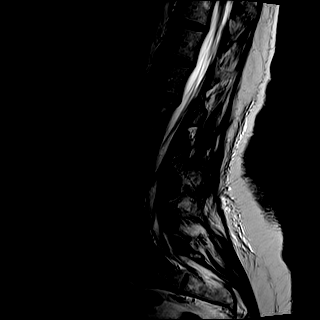
[im 14/17]
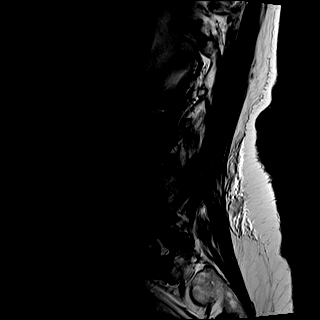
[im 17/17]
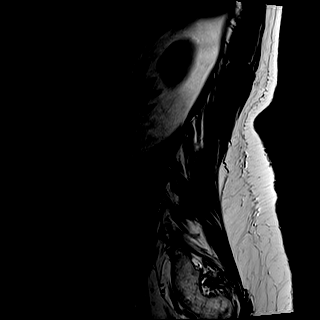

[Series 6: T1 · sagittal · 4.0mm · 0.81mm/px · 7 of 17 slices shown (1 of 2)]
[im 1/17]
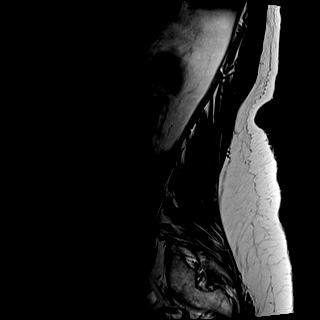
[im 3/17]
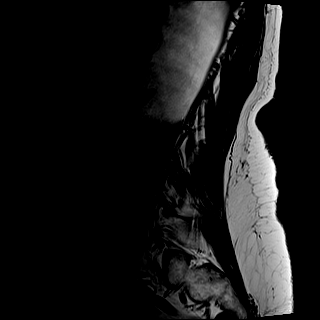
[im 6/17]
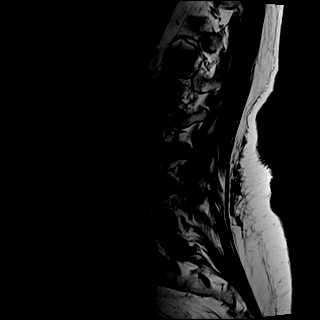
[im 9/17]
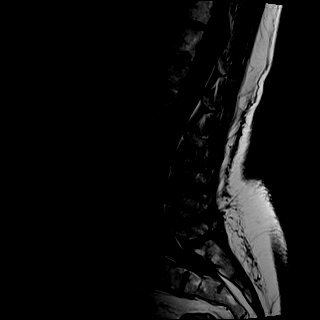
[im 11/17]
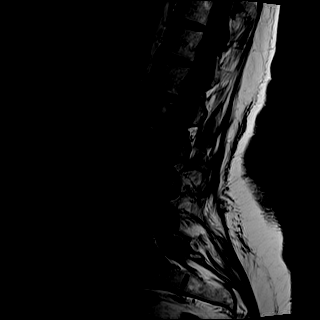
[im 14/17]
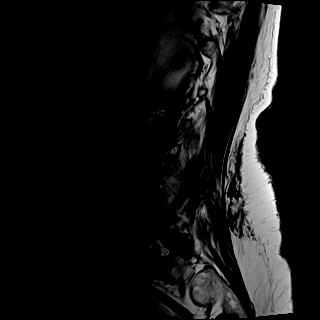
[im 17/17]
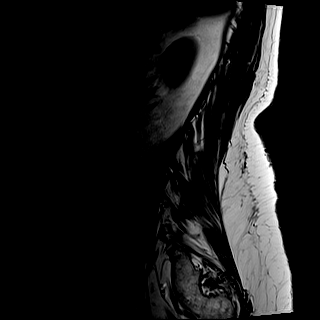

[Series 7: STIR · sagittal · 4.0mm · 0.41mm/px · 2 of 17 slices shown]
[im 1/17]
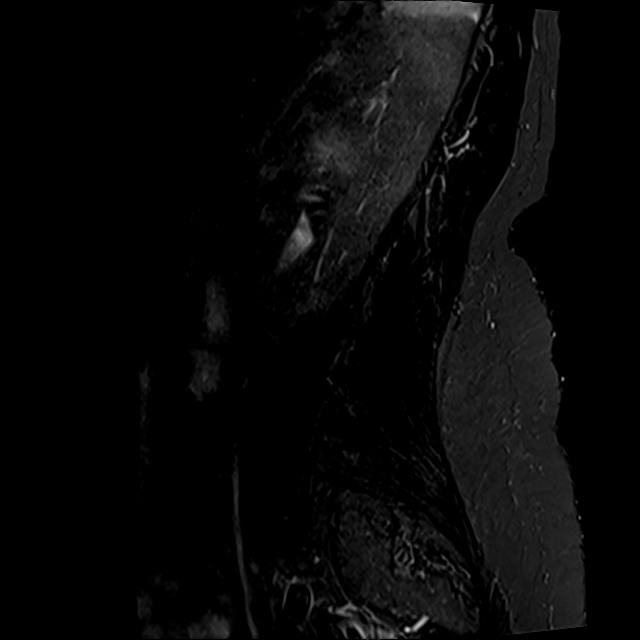
[im 4/17]
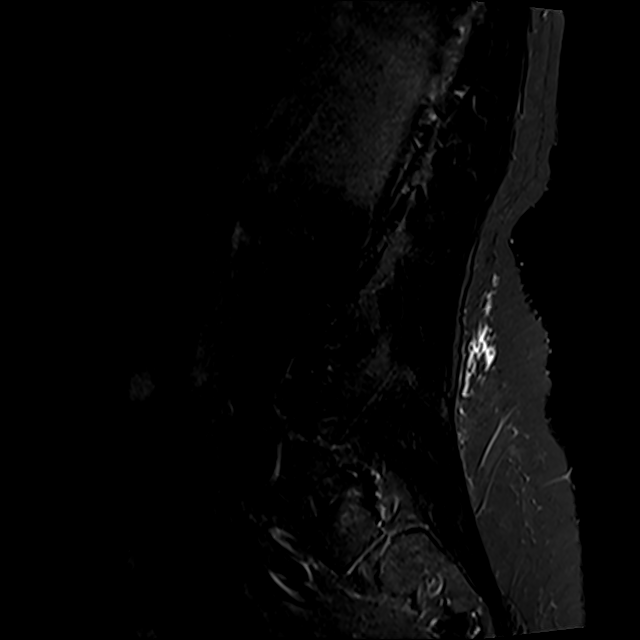

[Series 8: T2 · axial · 4.0mm · 0.78mm/px · z∈[-119,+108]mm · 8 of 38 slices shown (2 of 2)]
[im 1/38]
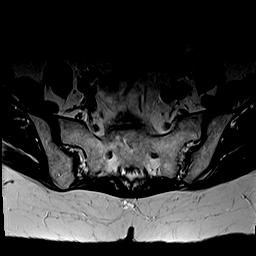
[im 6/38]
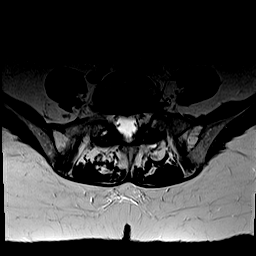
[im 12/38]
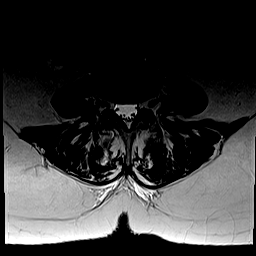
[im 18/38]
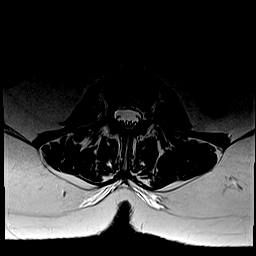
[im 20/38]
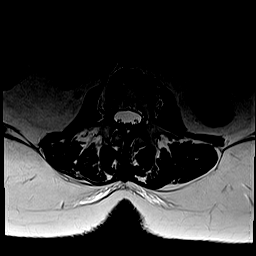
[im 26/38]
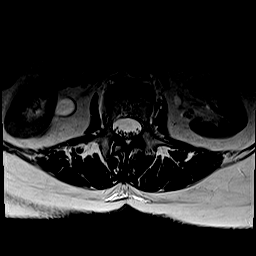
[im 32/38]
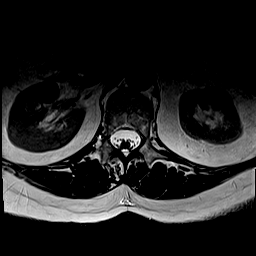
[im 38/38]
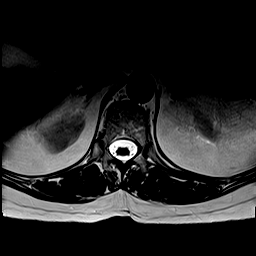

[Series 9: T1 · axial · 4.0mm · 0.39mm/px · z∈[-119,+108]mm · 8 of 38 slices shown (2 of 2)]
[im 1/38]
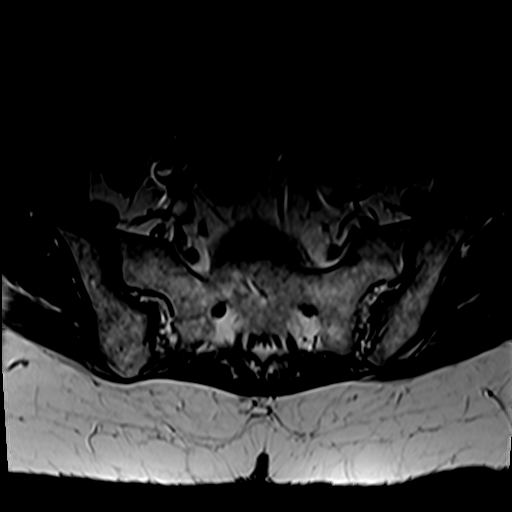
[im 6/38]
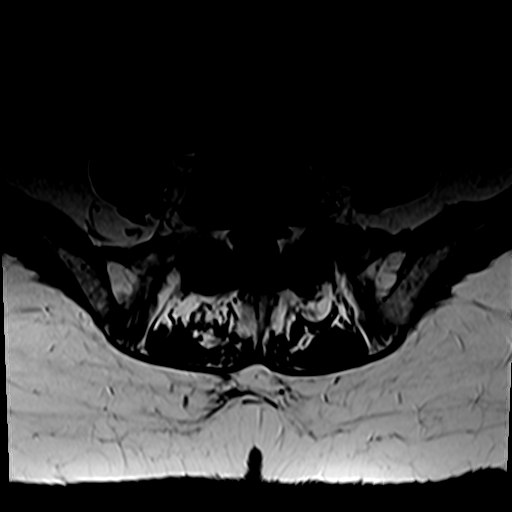
[im 12/38]
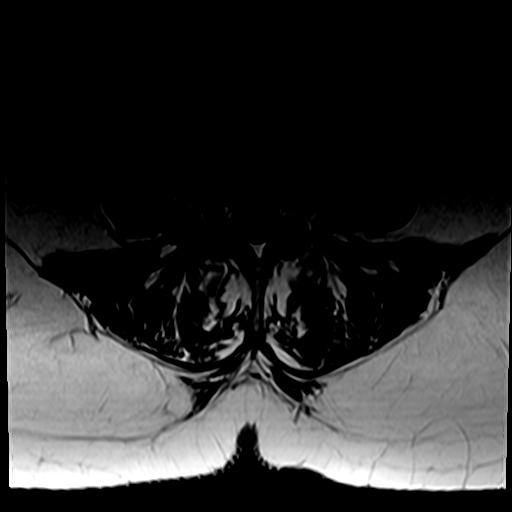
[im 18/38]
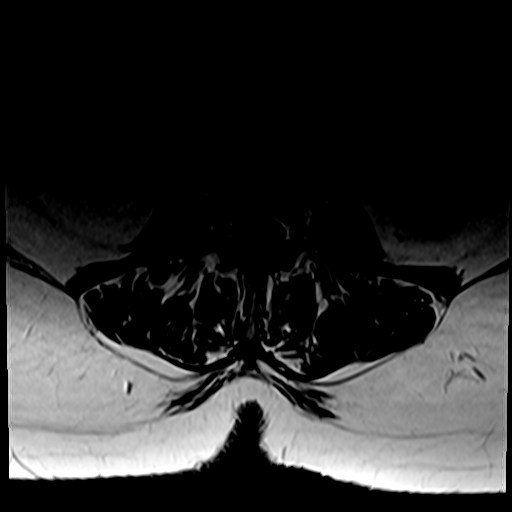
[im 20/38]
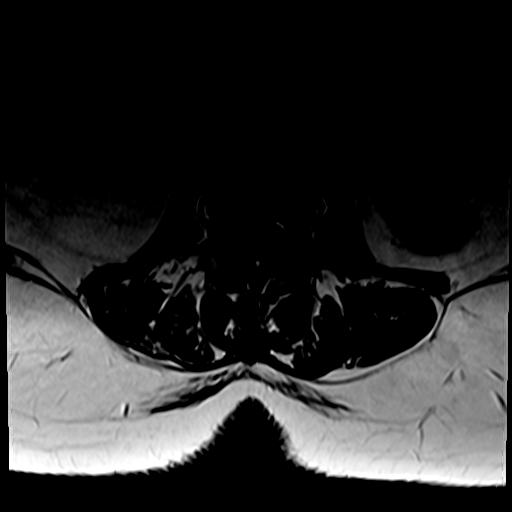
[im 26/38]
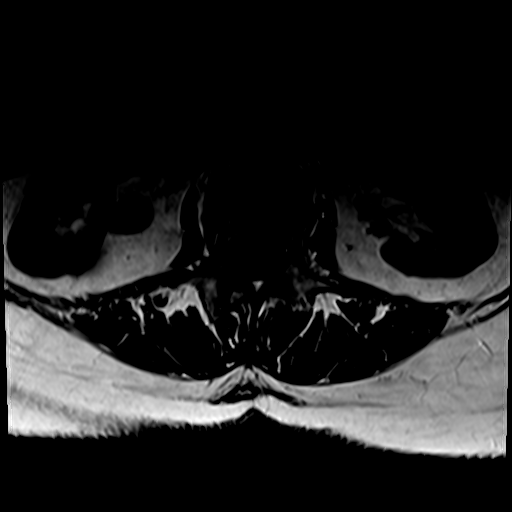
[im 32/38]
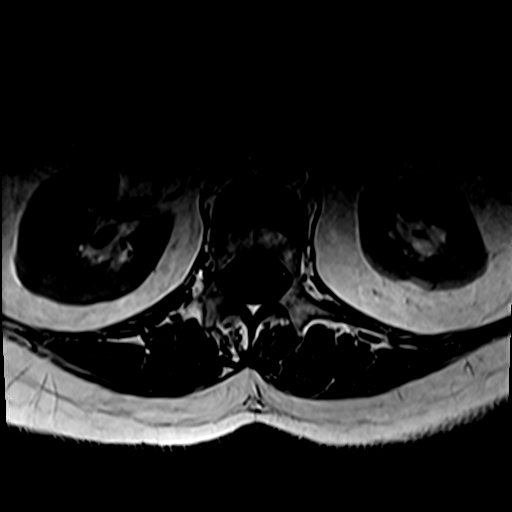
[im 38/38]
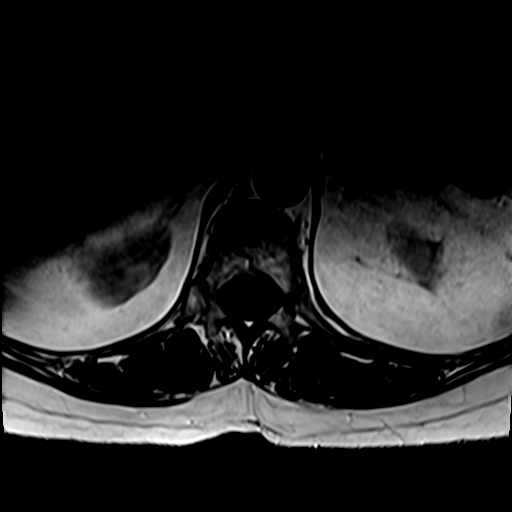

[32 of 48 positions shown; findings below may reference images not displayed]

FINDINGS: Segmentation:  Normal

Alignment:  Normal

Vertebrae:  Normal bone marrow.  Negative for fracture or mass.

Conus medullaris and cauda equina: Conus extends to the L1-2 level.
Conus and cauda equina appear normal.

Paraspinal and other soft tissues: Negative for paraspinous mass,
adenopathy, or fluid collection.

Disc levels:

L1-2: Disc degeneration with mild disc bulging and endplate
spurring. Small right-sided disc protrusion. Negative for stenosis.

L2-3: Negative

L3-4: Mild disc degeneration and disc bulging. Negative for stenosis

L4-5: Mild disc degeneration and disc bulging. Negative for stenosis

L5-S1: Mild disc degeneration with mild to moderate facet
degeneration. Negative for disc protrusion or stenosis.
IMPRESSION: Mild lumbar degenerative changes as above. Negative for stenosis or
neural impingement.

## 2021-10-19 ENCOUNTER — Ambulatory Visit: Payer: Medicare HMO | Admitting: Student in an Organized Health Care Education/Training Program

## 2021-10-19 ENCOUNTER — Other Ambulatory Visit: Payer: Self-pay

## 2021-10-19 ENCOUNTER — Encounter: Payer: Self-pay | Admitting: Student in an Organized Health Care Education/Training Program

## 2021-10-19 ENCOUNTER — Ambulatory Visit
Admission: RE | Admit: 2021-10-19 | Discharge: 2021-10-19 | Disposition: A | Discharge status: Home / Self Care | Payer: Medicare HMO | Source: Ambulatory Visit | Attending: Student in an Organized Health Care Education/Training Program | Admitting: Student in an Organized Health Care Education/Training Program

## 2021-10-19 DIAGNOSIS — M47816 Spondylosis without myelopathy or radiculopathy, lumbar region: Secondary | ICD-10-CM | POA: Insufficient documentation

## 2021-10-19 DIAGNOSIS — G894 Chronic pain syndrome: Secondary | ICD-10-CM | POA: Diagnosis present

## 2021-10-19 MED ORDER — ROPIVACAINE HCL 2 MG/ML IJ SOLN
9.0000 mL | Freq: Once | INTRAMUSCULAR | Status: AC
  Administered 2021-10-19: 20 mL via PERINEURAL

## 2021-10-19 MED ORDER — ROPIVACAINE HCL 2 MG/ML IJ SOLN
INTRAMUSCULAR | Status: AC
  Filled 2021-10-19: qty 20

## 2021-10-19 MED ORDER — LIDOCAINE HCL 2 % IJ SOLN
INTRAMUSCULAR | Status: AC
  Filled 2021-10-19: qty 20

## 2021-10-19 MED ORDER — LIDOCAINE HCL 2 % IJ SOLN
20.0000 mL | Freq: Once | INTRAMUSCULAR | Status: AC
  Administered 2021-10-19: 400 mg

## 2021-10-19 NOTE — Patient Instructions (Signed)
PNS instructions given by Roselyn Reef

## 2021-10-19 NOTE — Progress Notes (Signed)
Safety precautions to be maintained throughout the outpatient stay will include: orient to surroundings, keep bed in low position, maintain call bell within reach at all times, provide assistance with transfer out of bed and ambulation.  

## 2021-10-19 NOTE — Progress Notes (Signed)
PROVIDER NOTE: Interpretation of information contained herein should be left to medically-trained personnel. Specific patient instructions are provided elsewhere under "Patient Instructions" section of medical record. This document was created in part using STT-dictation technology, any transcriptional errors that may result from this process are unintentional.  Patient: Lindsay Fields Type: Established DOB: 07/24/1951 MRN: [id number] PCP: Kirk Ruths, MD  Service: Procedure DOS: 10/19/2021 Setting: Ambulatory Location: Ambulatory outpatient facility Delivery: Face-to-face Provider: Gillis Santa, MD Specialty: Interventional Pain Management Specialty designation: 09 Location: Outpatient facility Ref. Prov.: Gillis Santa, MD    Primary Reason for Admission: Surgical management of chronic pain condition.  Procedure:  Anesthesia, Analgesia, Anxiolysis:  Type: SPRINTTM Peripheral Nerve Field Stimulator (PNS) MicroLeadTM Implant Purpose: Therapeutic Region: Lumbar Level: L4-5 Facet Medial Branch Nerve Laterality: Left           Anesthesia: Local (1-2% Lidocaine)  Anxiolysis: None  Sedation: None  Guidance: Fluoroscopy & [place] CPT (74128)    1. 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 Chronic pain syndrome    NAS-11 Pain score:   Pre-procedure: 5 /10   Post-procedure: 5 /10   Pre-op H&P Assessment:  [name] is a 71 y.o. (year old), female patient, seen today for interventional treatment. She  has a past surgical history that includes Bladder fulguration; Colonoscopy; Dilation and curettage of uterus; Abdominal hysterectomy; Ovarian cyst removal; Tonsillectomy and adenoidectomy; and Colonoscopy with propofol (N/A, 07/12/2018).  Initial Vital Signs:  Pulse/EKG Rate: 64ECG Heart Rate: 67 Temp: 97.7 F (36.5 C) Resp: 16 BP: 135/77 SpO2: 100 %  BMI: Estimated body mass index is 25.82 kg/m as calculated from the  following:   Height as of this encounter: 5\' 6"  (1.676 m).   Weight as of this encounter: 160 lb (72.6 kg).  Risk Assessment: Allergies: Reviewed. She has No Known Allergies.  Allergy Precautions: None required Coagulopathies: Reviewed. None identified.  Blood-thinner therapy: None at this time Active Infection(s): Reviewed. None identified. [name] is afebrile  Site Confirmation: [name] was asked to confirm the procedure and laterality before marking the site, which she did. Procedure checklist: Completed Consent: Before the procedure and under the influence of no sedative(s), amnesic(s), or anxiolytics, the patient was informed of the treatment options, risks and possible complications. To fulfill our ethical and legal obligations, as recommended by the American Medical Association's Code of Ethics, I have informed the patient of my clinical impression; the nature and purpose of the treatment or procedure; the risks, benefits, and possible complications of the intervention; the alternatives, including doing nothing; the risk(s) and benefit(s) of the alternative treatment(s) or procedure(s); and the risk(s) and benefit(s) of doing nothing.  [name] was provided with information about the general risks and possible complications associated with most interventional procedures. These include, but are not limited to: failure to achieve desired goals, infection, bleeding, organ or nerve damage, allergic reactions, paralysis, and/or death.  In addition, she was informed of those risks and possible complications associated to this particular procedure, which include, but are not limited to: damage to the implant; failure to decrease pain; local, systemic, or serious CNS infections, intraspinal abscess with possible cord compression and paralysis, or life-threatening such as meningitis; intrathecal and/or epidural bleeding with formation of hematoma with possible spinal cord compression and permanent  paralysis; organ damage; nerve injury or damage with subsequent sensory, motor, and/or autonomic system dysfunction, resulting in transient or permanent pain, numbness, and/or weakness of one or  several areas of the body; allergic reactions, either minor or major life-threatening, such as anaphylactic or anaphylactoid reactions.  Furthermore, [name] was informed of those risks and complications associated with the medications. These include, but are not limited to: allergic reactions (i.e.: anaphylactic or anaphylactoid reactions); arrhythmia;  Hypotension/hypertension; cardiovascular collapse; respiratory depression and/or shortness of breath; swelling or edema; medication-induced neural toxicity; particulate matter embolism and blood vessel occlusion with resultant organ, and/or nervous system infarction and permanent paralysis.  Finally, she was informed that Medicine is not an exact science; therefore, there is also the possibility of unforeseen or unpredictable risks and/or possible complications that may result in a catastrophic outcome. The patient indicated having understood very clearly. We have given the patient no guarantees and we have made no promises. Enough time was given to the patient to ask questions, all of which were answered to the patient's satisfaction. [name] has indicated that she wanted to continue with the procedure. Attestation: I, the ordering provider, attest that I have discussed with the patient the benefits, risks, side-effects, alternatives, likelihood of achieving goals, and potential problems during recovery for the procedure that I have provided informed consent. Date   Time: 10/19/2021  8:06 AM  Pre-Procedure Preparation:  Monitoring: As per clinic protocol. Respiration, ETCO2, SpO2, BP, heart rate and rhythm monitor placed and checked for adequate function Safety Precautions: Patient was assessed for positional comfort and pressure points before starting the  procedure. Time-out: I initiated and conducted the "Time-out" before starting the procedure, as per protocol. The patient was asked to participate by confirming the accuracy of the "Time Out" information. Verification of the correct person, site, and procedure were performed and confirmed by me, the nursing staff, and the patient. "Time-out" conducted as per Joint Commission's Universal Protocol (UP.01.01.01). Time: 0841  Description of Procedure Process:   Position: Prone Target Area: <1 cm from targeted nerve (Left L4 medial branch) Approach: Posterior percutaneous, paramedial, interlaminar approach Area Prepped: Entire Lumbar Region Prepping solution: ChloraPrep (2% chlorhexidine gluconate and 70% isopropyl alcohol) Safety Precautions: Safe injection practices and needle disposal techniques used. Medications properly checked for expiration dates. SDV (single dose vial) medications used. Aspiration looking for blood return and/or CSF was conducted prior to all injections. At no point did I inject any substances, as a needle was being advanced. No attempts were made at seeking any paresthesias.  Description of the Procedure (Lumbar Medial Branch):  Availability of a responsible, adult driver, and NPO status confirmed. Informed consent was obtained after having discussed risks and possible complications. An IV was started. The patient was then taken to the fluoroscopy suite, where the patient was placed in position for the procedure, over the fluoroscopy table. The patient was then monitored in the usual manner. Fluoroscopy was manipulated to obtain the best possible view of the target. Parallex error was corrected before commencing the procedure. Once a clear view of the target had been obtained, the skin and subcutaneous tissue over the procedure site were infiltrated using lidocaine, loaded in a 10 cc luer-loc syringe with a 0.5 inch, 25-G needle. Care was taken to avoid numbing deeper tissues The  introducer needle(s) was/were then inserted through the skin and deeper tissues, specifically the multifidus muscle.  An introducer needle and stimulating probe were  assembled, inserted and advanced along the intended course of the medial branch nerve as it traverses the lamina medial and inferior to the zygapophyseal joint, taking care to maintain the proper depth of insertion as the introducer  is advanced under fluoroscopic. The introducer needle was delivered to a location in proximity to the nerve. Multiple stimulation parameters were used to deliver stimulation to the medial branch nerve in concert with stimulating at multiple positions around the nerve. Nerve target acquisition was confirmed noting generation of paresthesias in the paravertebral regions corresponding to the level being stimulated as well as rhythmic thumping within the multifidi, the latter being further corroborated via palpation.  Various electrical parameter combinations were tested, and the lead location was adjusted (physically relocated) until the patient indicated paresthesia or muscle tension overlapping the distribution of the patient's typical region of pain.  The stimulating probe was removed from the introducer and a percutaneous lead was guided through the needle and delivered to a location in similar proximity to the nerve. Final  location was verified with electrical stimulation and documented with fluoroscopy & ultrasound.  Ultrasound was used to confirm multifidus activation. The introducer needle was removed, and the exposed end of the percutaneous lead was attached to an external stimulator unit. Various electrical parameter combinations were again  tested until the patient indicated paresthesia or muscle tension  overlapping the distribution of the patient's typical region of pain.  After confirming that lead impedance was in the normal range, the external unit was detached, the needle was removed, and the lead was  anchored at the skin. The lead was threaded into the connector block and electrical continuity and desired patient response was confirmed. The connector block was attached to the external  stimulator unit.The site was covered with a sterile occlusive dressing and  a fluoroscopic and ultrasound image was taken to document final placement. The patient was observed for stability of vital signs and comfort.  Vitals:   10/19/21 0811 10/19/21 0845 10/19/21 0851 10/19/21 0854  BP: 135/77 139/81 (!) 144/90 (!) 141/80  Pulse: 64     Resp: 16 16 18 18   Temp: 97.7 F (36.5 C)     SpO2: 100% 99% 100% 100%  Weight: 160 lb (72.6 kg)     Height: 5\' 6"  (1.676 m)      Start Time: 0841 hrs. End Time: 0853 hrs.  Imaging Guidance (Spinal):          Type of Imaging Technique: Fluoroscopy Guidance (Spinal) Indication(s): Assistance in needle guidance and placement for procedures requiring needle placement in or near specific anatomical locations not easily accessible without such assistance. Exposure Time: Please see nurses notes. Contrast: None used. Fluoroscopic Guidance: I was personally present during the use of fluoroscopy. "Tunnel Vision Technique" used to obtain the best possible view of the target area. Parallax error corrected before commencing the procedure. "Direction-depth-direction" technique used to introduce the needle under continuous pulsed fluoroscopy. Once target was reached, antero-posterior, oblique, and lateral fluoroscopic projection used confirm needle placement in all planes. Images permanently stored in EMR. Interpretation: No contrast injected. I personally interpreted the imaging intraoperatively. Adequate needle placement confirmed in multiple planes. Permanent images saved into the patient's record.   Post-operative Assessment:  Post-procedure Vital Signs:  Pulse/HCG Rate: 6464 Temp: 97.7 F (36.5 C) Resp: 18 BP: (!) 141/80 SpO2: 027 %  Complications: No immediate  post-treatment complications observed by team, or reported by patient.  Note: The patient tolerated the entire procedure well. A repeat set of vitals were taken after the procedure and the patient was kept under observation following institutional policy, for this type of procedure. Post-procedural neurological assessment was performed, showing return to baseline, prior to discharge. The patient was provided  with post-procedure discharge instructions, including a section on how to identify potential problems. Should any problems arise concerning this procedure, the patient was given instructions to immediately contact us, at any time, without hesitation. In any case, we plan to contact the patient by telephone for a follow-up status report regarding this interventional procedure.  Comments:  No additional relevant information.  Plan of Care  Orders:  Orders Placed This Encounter  Procedures   DG PAIN CLINIC C-ARM 1-60 MIN NO REPORT    Intraoperative interpretation by procedural physician at [facility].    Standing Status:   Standing    Number of Occurrences:   1    Order Specific Question:   Reason for exam:    Answer:   Assistance in needle guidance and placement for procedures requiring needle placement in or near specific anatomical locations not easily accessible without such assistance.    Medications administered: We administered lidocaine and ropivacaine (PF) 2 mg/mL (0.2%).  See the medical record for exact dosing, route, and time of administration.  Follow-up plan:   Return in about 8 weeks (around 12/14/2021) for PNS lead removal.     Recent Visits Date Type Provider Dept  09/21/21 Office Visit Gillis Santa, MD [facility]  Showing recent visits within past 90 days and meeting all other requirements Today's Visits Date Type Provider Dept  10/19/21 Procedure visit Gillis Santa, MD [facility]  Showing today's visits and meeting all other  requirements Future Appointments No visits were found meeting these conditions. Showing future appointments within next 90 days and meeting all other requirements  Disposition: Discharge home  Discharge (Date   Time): 10/19/2021; 0915 hrs.   Primary Care Physician: Kirk Ruths, MD Location: [facility] Note by: Gillis Santa, MD Date: 10/19/2021; Time: 9:06 AM

## 2021-10-20 ENCOUNTER — Telehealth: Payer: Self-pay | Admitting: *Deleted

## 2021-10-20 NOTE — Telephone Encounter (Signed)
Attempted to call for post procedure follow-up. Message left. 

## 2021-12-03 ENCOUNTER — Telehealth: Payer: Self-pay | Admitting: Student in an Organized Health Care Education/Training Program

## 2021-12-03 ENCOUNTER — Encounter: Payer: Self-pay | Admitting: Student in an Organized Health Care Education/Training Program

## 2021-12-03 NOTE — Telephone Encounter (Signed)
Scheduled patient for VV Monday she will need nurse call please.

## 2021-12-07 ENCOUNTER — Other Ambulatory Visit: Payer: Self-pay

## 2021-12-07 ENCOUNTER — Ambulatory Visit
Payer: Medicare HMO | Attending: Student in an Organized Health Care Education/Training Program | Admitting: Student in an Organized Health Care Education/Training Program

## 2021-12-07 DIAGNOSIS — G894 Chronic pain syndrome: Secondary | ICD-10-CM | POA: Diagnosis not present

## 2021-12-07 DIAGNOSIS — M47816 Spondylosis without myelopathy or radiculopathy, lumbar region: Secondary | ICD-10-CM | POA: Diagnosis not present

## 2021-12-07 DIAGNOSIS — R6889 Other general symptoms and signs: Secondary | ICD-10-CM | POA: Diagnosis not present

## 2021-12-07 MED ORDER — CEPHALEXIN 500 MG PO CAPS: 500.0000 mg | ORAL_CAPSULE | Freq: Four times a day (QID) | ORAL | 0 refills | Status: AC

## 2021-12-07 NOTE — Progress Notes (Signed)
Patient: Lindsay Fields  Service Category: E/M  Provider: Gillis Santa, MD  DOB: 12/15/1950  DOS: 12/07/2021  Location: Office  MRN: [id number]  Setting: Ambulatory outpatient  Referring Provider: Kirk Ruths, MD  Type: Established Patient  Specialty: Interventional Pain Management  PCP: Kirk Ruths, MD  Location: Remote location  Delivery: TeleHealth     Virtual Encounter - Pain Management PROVIDER NOTE: Information contained herein reflects review and annotations entered in association with encounter. Interpretation of such information and data should be left to medically-trained personnel. Information provided to patient can be located elsewhere in the medical record under "Patient Instructions". Document created using STT-dictation technology, any transcriptional errors that may result from process are unintentional.    Contact & Pharmacy Preferred: [phone] Home: [phone] (home) Mobile: [phone] (mobile) E-mail: [email or web address], [place] [place] [address] Phone: [phone] Fax: [phone]   Pre-screening  Ms. Duffell offered "in-person" vs "virtual" encounter. She indicated preferring virtual for this encounter.   Reason COVID-19*   Social distancing based on CDC and AMA recommendations.   I contacted VALORIE MCGRORY on 12/07/2021 via telephone.      I clearly identified myself as Gillis Santa, MD. I verified that I was speaking with the correct person using two identifiers (Name: TANIAH REINECKE, and date of birth: [birth year]).  Consent I sought verbal advanced consent from WINEFRED HILLESHEIM for virtual visit interactions. I informed Ms. Loflin of possible security and privacy concerns, risks, and limitations associated with providing "not-in-person" medical evaluation and management services. I also informed Ms. Hernan of the availability of "in-person" appointments. Finally, I informed  her that there would be a charge for the virtual visit and that she could be  personally, fully or partially, financially responsible for it. Ms. Dam expressed understanding and agreed to proceed.   Historic Elements   Ms. LACEY WALLMAN is a 71 y.o. year old, female patient evaluated today after our last contact on 12/03/2021. Ms. Carder  has a past medical history of Allergic genetic state, Bladder polyps, Dysrhythmia, Fibrocystic breast disease, Frequent UTI, H/O degenerative disc disease, Hypercholesterolemia, and Osteoporosis. She also  has a past surgical history that includes Bladder fulguration; Colonoscopy; Dilation and curettage of uterus; Abdominal hysterectomy; Ovarian cyst removal; Tonsillectomy and adenoidectomy; and Colonoscopy with propofol (N/A, 07/12/2018). Ms. Jonsson has a current medication list which includes the following prescription(s): acetaminophen, azelastine hcl, bisoprolol, cephalexin, fluticasone, gabapentin, levocetirizine, methocarbamol, raloxifene, topiramate, and epinephrine. She  reports that she has never smoked. She has never used smokeless tobacco. She reports current alcohol use of about 3.0 - 5.0 standard drinks per week. She reports that she does not use drugs. Ms. Halberg has No Known Allergies.   HPI  Today, she is being contacted for  increased muscle spasms  She does have a PNS lead in place that she has been utilizing Is unsure how much pain relief she is currently receiving, states that she may be receiving mild benefit at best Her husband has been assessing her lead site and states that here is sight erythema there Is coming next week to have her PNS lead removed but is wondering if she can come in earlier as she is having some irritation at the skin site.   Assessment  The primary encounter diagnosis was Lumbar facet arthropathy. Diagnoses of Spondylosis without myelopathy or radiculopathy, lumbar region, Chronic pain syndrome, and Suspected soft tissue infection  were also pertinent to this visit.  Plan of Care   Patient to come in this Wednesday for Sprint peripheral nerve stimulator lead removal.  She states that there is mild erythema along the lead insertion site.  It is not draining.  It is slightly tender to touch.  We will send in a prescription for Keflex for presumed soft tissue infection.  I will visually inspect and assess this Wednesday.  Pharmacotherapy (Medications Ordered): Meds ordered this encounter  Medications   cephALEXin (KEFLEX) 500 MG capsule    Sig: Take 1 capsule (500 mg total) by mouth 4 (four) times daily for 7 days.    Dispense:  28 capsule    Refill:  0     Follow-up plan:   Return in about 2 days (around 12/09/2021) for PNS lead pull (1 pm).    Recent Visits Date Type Provider Dept  10/19/21 Procedure visit Gillis Santa, MD [facility]  09/21/21 Office Visit Gillis Santa, MD [facility]  Showing recent visits within past 90 days and meeting all other requirements Today's Visits Date Type Provider Dept  12/07/21 Office Visit Gillis Santa, MD [facility]  Showing today's visits and meeting all other requirements Future Appointments Date Type Provider Dept  12/16/21 Appointment Gillis Santa, MD [facility]  Showing future appointments within next 90 days and meeting all other requirements  I discussed the assessment and treatment plan with the patient. The patient was provided an opportunity to ask questions and all were answered. The patient agreed with the plan and demonstrated an understanding of the instructions.  Patient advised to call back or seek an in-person evaluation if the symptoms or condition worsens.  Duration of encounter: 87minutes.  Note by: Gillis Santa, MD Date: 12/07/2021; Time: 3:23 PM

## 2021-12-09 ENCOUNTER — Ambulatory Visit
Payer: Medicare HMO | Attending: Student in an Organized Health Care Education/Training Program | Admitting: Student in an Organized Health Care Education/Training Program

## 2021-12-09 ENCOUNTER — Encounter: Payer: Self-pay | Admitting: Student in an Organized Health Care Education/Training Program

## 2021-12-09 ENCOUNTER — Other Ambulatory Visit: Payer: Self-pay | Admitting: *Deleted

## 2021-12-09 ENCOUNTER — Other Ambulatory Visit: Payer: Self-pay

## 2021-12-09 VITALS — BP 141/88 | HR 64 | Temp 98.0°F | Resp 16 | Ht 66.0 in | Wt 160.0 lb

## 2021-12-09 DIAGNOSIS — G894 Chronic pain syndrome: Secondary | ICD-10-CM | POA: Diagnosis not present

## 2021-12-09 DIAGNOSIS — M47816 Spondylosis without myelopathy or radiculopathy, lumbar region: Secondary | ICD-10-CM | POA: Diagnosis not present

## 2021-12-09 MED ORDER — TIZANIDINE HCL 4 MG PO TABS: 4.0000 mg | ORAL_TABLET | Freq: Two times a day (BID) | ORAL | 2 refills | Status: AC | PRN

## 2021-12-09 NOTE — Patient Instructions (Signed)

## 2021-12-09 NOTE — Progress Notes (Signed)
PROVIDER NOTE: Information contained herein reflects review and annotations entered in association with encounter. Interpretation of such information and data should be left to medically-trained personnel. Information provided to patient can be located elsewhere in the medical record under "Patient Instructions". Document created using STT-dictation technology, any transcriptional errors that may result from process are unintentional.  ?  ?Patient: Lindsay Fields  Service Category: E/M  Provider: Gillis Santa, MD  ?DOB: [birth year]  DOS: 12/09/2021  Specialty: Interventional Pain Management  ?MRN: [id number]  Setting: Ambulatory outpatient  PCP: Kirk Ruths, MD  ?Type: Established Patient    Referring Provider: Kirk Ruths, MD  ?Location: Office  Delivery: Face-to-face    ? ?HPI  ?Lindsay Fields, a 71 y.o. year old female, is here today because of her Lumbar facet arthropathy [M47.816]. Ms. Linz primary complain today is Back Pain (lower) ?Last encounter: My last encounter with her was on 12/07/2021. ?Pertinent problems: Lindsay Fields has Spondylosis without myelopathy or radiculopathy, lumbar region and Chronic pain syndrome on their pertinent problem list. ?Pain Assessment: Severity of Chronic pain is reported as a 5 /10. Location: Back Lower/denies. Onset: More than a month ago. Quality: Aching, Throbbing, Tightness. Timing: Constant. Modifying factor(s): heating pad, Tylenol. ?Vitals:  height is 5' 6"  (1.676 m) and weight is 160 lb (72.6 kg). Her temporal temperature is 98 ?F (36.7 ?C). Her blood pressure is 141/88 (abnormal) and her pulse is 64. Her respiration is 16 and oxygen saturation is 100%.  ? ?Reason for encounter:  ? ?Lindsay Fields comes in today for removal of her left L4-L5 Sprint peripheral nerve stimulator microleak.  Unfortunately, she did not experience significant pain relief in regards to her low back pain during the duration of her peripheral nerve stimulation therapy.  Today we removed her  peripheral nerve stimulator lead with tips intact.  Nursing was present to confirm.  I will send in a prescription for tizanidine to help with lumbar paraspinal muscle spasms.  We will touch base in 4 weeks to see how she is doing. ? ?Of note, there is mild erythema at her insertion site.  No drainage noted.  She is on Keflex which I prescribed a couple of days ago for a presumed soft tissue infection which instructed her to finish for its full course. ? ?ROS  ?Constitutional: Denies any fever or chills ?Gastrointestinal: No reported hemesis, hematochezia, vomiting, or acute GI distress ?Musculoskeletal:  Left low back pain ?Neurological: No reported episodes of acute onset apraxia, aphasia, dysarthria, agnosia, amnesia, paralysis, loss of coordination, or loss of consciousness ? ?Medication Review  ?Azelastine HCl, EPINEPHrine, acetaminophen, bisoprolol, cephALEXin, fluticasone, gabapentin, levocetirizine, methocarbamol, raloxifene, tiZANidine, and topiramate ? ?History Review  ?Allergy: Lindsay Fields has No Known Allergies. ?Drug: Lindsay Fields  reports no history of drug use. ?Alcohol:  reports current alcohol use of about 3.0 - 5.0 standard drinks per week. ?Tobacco:  reports that she has never smoked. She has never used smokeless tobacco. ?Social: Lindsay Fields  reports that she has never smoked. She has never used smokeless tobacco. She reports current alcohol use of about 3.0 - 5.0 standard drinks per week. She reports that she does not use drugs. ?Medical:  has a past medical history of Allergic genetic state, Bladder polyps, Dysrhythmia, Fibrocystic breast disease, Frequent UTI, H/O degenerative disc disease, Hypercholesterolemia, and Osteoporosis. ?Surgical: Lindsay Fields  has a past surgical history that includes Bladder fulguration; Colonoscopy; Dilation and curettage of uterus; Abdominal hysterectomy; Ovarian cyst removal; Tonsillectomy and adenoidectomy; and  Colonoscopy with propofol (N/A, 07/12/2018). ?Family: family  history includes Breast cancer (age of onset: 4) in her maternal aunt. ? ?Laboratory Chemistry Profile  ? ?Renal ?No results found for: BUN, CREATININE, LABCREA, BCR, GFR, GFRAA, GFRNONAA, LABVMA, EPIRU, EPINEPH24HUR, NOREPRU, NOREPI24HUR, DOPARU, [id number]  Hepatic ?No results found for: AST, ALT, ALBUMIN, ALKPHOS, HCVAB, AMYLASE, LIPASE, AMMONIA  ?Electrolytes ?No results found for: NA, K, CL, CALCIUM, MG, PHOS  Bone ?No results found for: [place], [id number], [id number], [id number], 25OHVITD1, 25OHVITD2, 25OHVITD3, TESTOFREE, TESTOSTERONE  ?Inflammation (CRP: Acute Phase) (ESR: Chronic Phase) ?No results found for: CRP, ESRSEDRATE, LATICACIDVEN    ?  ? ?Note: Above Lab results reviewed. ? ? ?Physical Exam  ?General appearance: Well nourished, well developed, and well hydrated. In no apparent acute distress ?Mental status: Alert, oriented x 3 (person, place, & time)       ?Respiratory: No evidence of acute respiratory distress ?Eyes: PERLA ?Vitals: BP (!) 141/88   Pulse 64   Temp 98 ?F (36.7 ?C) (Temporal)   Resp 16   Ht 5' 6"  (1.676 m)   Wt 160 lb (72.6 kg)   SpO2 100%   BMI 25.82 kg/m?  ?BMI: Estimated body mass index is 25.82 kg/m? as calculated from the following: ?  Height as of this encounter: 5' 6"  (1.676 m). ?  Weight as of this encounter: 160 lb (72.6 kg). ?Ideal: Ideal body weight: 59.3 kg (130 lb 11.7 oz) ?Adjusted ideal body weight: 64.6 kg (142 lb 7 oz) ? ?Lumbar spine: Left L4-L5 peripheral nerve stimulator lead removed with tip intact, nursing present to confirm.  Slight erythema at insertion site.  Patient instructed to continue Keflex for soft tissue infection.  We will send in prescription for tizanidine.  Follow-up in 4 weeks. ? ? ?Assessment  ? ?Status Diagnosis  ?Controlled ?Controlled ?Controlled 1. Lumbar facet arthropathy   ?2. Spondylosis without myelopathy or radiculopathy, lumbar region   ?3. Chronic pain syndrome   ?  ? ?Updated Problems: ?Problem  ?Chronic Pain Syndrome   ?Spondylosis Without Myelopathy Or Radiculopathy, Lumbar Region  ? ? ?Plan of Care  ? ? ?Lindsay Fields has a current medication list which includes the following long-term medication(s): azelastine hcl, bisoprolol, fluticasone, gabapentin, levocetirizine, and topiramate. ? ?Pharmacotherapy (Medications Ordered): ?Meds ordered this encounter  ?Medications  ? tiZANidine (ZANAFLEX) 4 MG tablet  ?  Sig: Take 1 tablet (4 mg total) by mouth 2 (two) times daily as needed for muscle spasms.  ?  Dispense:  60 tablet  ?  Refill:  2  ?  Do not place this medication, or any other prescription from our practice, on "Automatic Refill". Patient may have prescription filled one day early if pharmacy is closed on scheduled refill date.  ? ?Status post left L4 medial branch Sprint peripheral nerve stimulation.  Lead removed with tips intact.  We will assess how patient does over the next 4 weeks. ? ?Follow-up plan:   ?Return in about 4 weeks (around 01/06/2022) for Post Procedure Evaluation, virtual.   ? ?Recent Visits ?Date Type Provider Dept  ?12/07/21 Office Visit Gillis Santa, MD [facility]  ?10/19/21 Procedure visit Gillis Santa, MD [facility]  ?09/21/21 Office Visit Gillis Santa, MD [facility]  ?Showing recent visits within past 90 days and meeting all other requirements ?Today's Visits ?Date Type Provider Dept  ?12/09/21 Procedure visit Gillis Santa, MD [facility]  ?Showing today's visits and meeting all other requirements ?Future Appointments ?Date Type Provider Dept  ?  01/12/22 Appointment Gillis Santa, MD [facility]  ?Showing future appointments within next 90 days and meeting all other requirements ? ?I discussed the assessment and treatment plan with the patient. The patient was provided an opportunity to ask questions and all were answered. The patient agreed with the plan and demonstrated an understanding of the instructions. ? ?Patient advised to call back  or seek an in-person evaluation if the symptoms or condition worsens. ? ?Duration of encounter: 40mnutes. ? ?Note by: BGillis Santa MD ?Date: 12/09/2021; Time: 1:24 PM ?

## 2021-12-14 ENCOUNTER — Ambulatory Visit: Payer: Medicare HMO | Admitting: Student in an Organized Health Care Education/Training Program

## 2021-12-16 ENCOUNTER — Ambulatory Visit: Payer: Medicare HMO | Admitting: Student in an Organized Health Care Education/Training Program

## 2022-01-12 ENCOUNTER — Other Ambulatory Visit: Payer: Self-pay

## 2022-01-12 ENCOUNTER — Encounter: Payer: Self-pay | Admitting: Student in an Organized Health Care Education/Training Program

## 2022-01-12 ENCOUNTER — Ambulatory Visit
Payer: Medicare HMO | Attending: Student in an Organized Health Care Education/Training Program | Admitting: Student in an Organized Health Care Education/Training Program

## 2022-01-12 VITALS — BP 106/63 | HR 59 | Temp 97.0°F | Resp 18 | Ht 66.0 in | Wt 158.0 lb

## 2022-01-12 DIAGNOSIS — G894 Chronic pain syndrome: Secondary | ICD-10-CM | POA: Insufficient documentation

## 2022-01-12 DIAGNOSIS — M47816 Spondylosis without myelopathy or radiculopathy, lumbar region: Secondary | ICD-10-CM | POA: Diagnosis present

## 2022-01-12 MED ORDER — GABAPENTIN 100 MG PO CAPS: 100.0000 mg | ORAL_CAPSULE | Freq: Every day | ORAL | 5 refills | Status: DC

## 2022-01-12 NOTE — Progress Notes (Signed)
PROVIDER NOTE: Information contained herein reflects review and annotations entered in association with encounter. Interpretation of such information and data should be left to medically-trained personnel. Information provided to patient can be located elsewhere in the medical record under "Patient Instructions". Document created using STT-dictation technology, any transcriptional errors that may result from process are unintentional.  ?  ?Patient: Lindsay Fields  Service Category: E/M  Provider: Gillis Santa, MD  ?DOB: 24-May-1951  DOS: 01/12/2022  Specialty: Interventional Pain Management  ?MRN: [id number]  Setting: Ambulatory outpatient  PCP: Lindsay Ruths, MD  ?Type: Established Patient    Referring Provider: Kirk Ruths, MD  ?Location: Office  Delivery: Face-to-face    ? ?HPI  ?[name], a 71 y.o. year old female, is here today because of her Lumbar facet arthropathy [M47.816]. [name] primary complain today is Back Pain (low) and Hip Pain (left) ?Last encounter: My last encounter with her was on 12/09/2021. ?Pertinent problems: [name] has Spondylosis without myelopathy or radiculopathy, lumbar region and Chronic pain syndrome on their pertinent problem list. ?Pain Assessment: Severity of Chronic pain is reported as a 3 /10. Location: Back Lower/left hip. Onset: More than a month ago. Quality: Constant, Aching. Timing: Constant. Modifying factor(s):  . ?Vitals:  height is 5' 6"  (1.676 m) and weight is 158 lb (71.7 kg). Her temporal temperature is 97 ?F (36.1 ?C) (abnormal). Her blood pressure is 106/63 and her pulse is 59 (abnormal). Her respiration is 18 and oxygen saturation is 99%.  ? ?Reason for encounter: post-procedure evaluation and assessment and MM ? ? ?Post-procedure evaluation  ?Left L4-L5 Sprint peripheral nerve stimulation ?Effectiveness:  ? ? ?Ongoing improvement:  ?Analgesic:  20%-30% ?Function: [name] reports improvement in function ?ROM: [name] reports improvement in  ROM ? ? ? ? ?ROS  ?Constitutional: Denies any fever or chills ?Gastrointestinal: No reported hemesis, hematochezia, vomiting, or acute GI distress ?Musculoskeletal:  Left low back pain ?Neurological: No reported episodes of acute onset apraxia, aphasia, dysarthria, agnosia, amnesia, paralysis, loss of coordination, or loss of consciousness ? ?Medication Review  ?Azelastine HCl, acetaminophen, bisoprolol, fluticasone, gabapentin, levocetirizine, raloxifene, and tiZANidine ? ?History Review  ?Allergy: [name] has No Known Allergies. ?Drug: [name]  reports no history of drug use. ?Alcohol:  reports current alcohol use of about 3.0 - 5.0 standard drinks per week. ?Tobacco:  reports that she has never smoked. She has never used smokeless tobacco. ?Social: [name]  reports that she has never smoked. She has never used smokeless tobacco. She reports current alcohol use of about 3.0 - 5.0 standard drinks per week. She reports that she does not use drugs. ?Medical:  has a past medical history of Allergic genetic state, Bladder polyps, Dysrhythmia, Fibrocystic breast disease, Frequent UTI, H/O degenerative disc disease, Hypercholesterolemia, and Osteoporosis. ?Surgical: [name]  has a past surgical history that includes Bladder fulguration; Colonoscopy; Dilation and curettage of uterus; Abdominal hysterectomy; Ovarian cyst removal; Tonsillectomy and adenoidectomy; and Colonoscopy with propofol (N/A, 07/12/2018). ?Family: family history includes Breast cancer (age of onset: 88) in her maternal aunt. ? ?Laboratory Chemistry Profile  ? ?Renal ?No results found for: BUN, CREATININE, LABCREA, BCR, GFR, GFRAA, GFRNONAA, LABVMA, EPIRU, EPINEPH24HUR, NOREPRU, NOREPI24HUR, DOPARU, [id number]  Hepatic ?No results found for: AST, ALT, ALBUMIN, ALKPHOS, HCVAB, AMYLASE, LIPASE, AMMONIA  ?Electrolytes ?No results found for: NA, K, CL, CALCIUM, MG, PHOS  Bone ?No results found for: [place], [id number], [id number], [id number], 25OHVITD1,  25OHVITD2, 25OHVITD3, TESTOFREE, TESTOSTERONE  ?Inflammation (CRP:  Acute Phase) (ESR: Chronic Phase) ?No results found for: CRP, ESRSEDRATE, LATICACIDVEN    ?  ? ?Note: Above Lab results reviewed. ? ? ?Physical Exam  ?General appearance: Well nourished, well developed, and well hydrated. In no apparent acute distress ?Mental status: Alert, oriented x 3 (person, place, & time)       ?Respiratory: No evidence of acute respiratory distress ?Eyes: PERLA ?Vitals: BP 106/63   Pulse (!) 59   Temp (!) 97 ?F (36.1 ?C) (Temporal)   Resp 18   Ht 5' 6"  (1.676 m)   Wt 158 lb (71.7 kg)   SpO2 99%   BMI 25.50 kg/m?  ?BMI: Estimated body mass index is 25.5 kg/m? as calculated from the following: ?  Height as of this encounter: 5' 6"  (1.676 m). ?  Weight as of this encounter: 158 lb (71.7 kg). ?Ideal: Ideal body weight: 59.3 kg (130 lb 11.7 oz) ?Adjusted ideal body weight: 64.2 kg (141 lb 10.2 oz) ? ?Assessment  ? ?Diagnosis Status  ?1. Lumbar facet arthropathy   ?2. Spondylosis without myelopathy or radiculopathy, lumbar region   ?3. Chronic pain syndrome   ? Controlled ?Controlled ?Controlled  ? ? ?Plan of Care  ?Overall, Lindsay Fields did experience mild pain relief functional benefit after her left L4-L5 Sprint peripheral nerve stimulation therapy. ?She is still utilizing a heating pad.  She is wondering if she can discontinue her Topamax.  She has been on it for many years.  I recommend that she decrease her dose from 25-12.5 for 1 to 2 weeks and then discontinue. ?She is currently on gabapentin 100 mg nightly which she can titrate up to 200 mg nightly if she has trouble weaning off of her Topamax.  Prescription sent in for the next 6 months. ?Otherwise continue walking and volunteering at church which helps keep her active ?Follow-up in 6 months. ? ? ?Pharmacotherapy (Medications Ordered): ?Meds ordered this encounter  ?Medications  ? gabapentin (NEURONTIN) 100 MG capsule  ?  Sig: Take 1-2 capsules (100-200 mg total) by mouth  at bedtime.  ?  Dispense:  60 capsule  ?  Refill:  5  ? ?Orders:  ?No orders of the defined types were placed in this encounter. ? ?Follow-up plan:   ?Return in about 6 months (around 07/14/2022) for Medication Management, in person.   ?  ?Status post left L4-L5 Sprint peripheral nerve stimulation ?  ?Recent Visits ?Date Type Provider Dept  ?12/09/21 Procedure visit Gillis Santa, MD [facility]  ?12/07/21 Office Visit Gillis Santa, MD [facility]  ?10/19/21 Procedure visit Gillis Santa, MD [facility]  ?Showing recent visits within past 90 days and meeting all other requirements ?Today's Visits ?Date Type Provider Dept  ?01/12/22 Office Visit Gillis Santa, MD [facility]  ?Showing today's visits and meeting all other requirements ?Future Appointments ?No visits were found meeting these conditions. ?Showing future appointments within next 90 days and meeting all other requirements ? ?I discussed the assessment and treatment plan with the patient. The patient was provided an opportunity to ask questions and all were answered. The patient agreed with the plan and demonstrated an understanding of the instructions. ? ?Patient advised to call back or seek an in-person evaluation if the symptoms or condition worsens. ? ?Duration of encounter: 91mnutes. ? ?Note by: BGillis Santa MD ?Date: 01/12/2022; Time: 3:01 PM ?

## 2022-01-25 DIAGNOSIS — N1831 Chronic kidney disease, stage 3a: Secondary | ICD-10-CM | POA: Insufficient documentation

## 2022-01-25 DIAGNOSIS — R7303 Prediabetes: Secondary | ICD-10-CM | POA: Insufficient documentation

## 2022-03-30 ENCOUNTER — Encounter: Payer: Self-pay | Admitting: Internal Medicine

## 2022-03-31 ENCOUNTER — Ambulatory Visit: Payer: Medicare HMO | Admitting: Anesthesiology

## 2022-03-31 ENCOUNTER — Other Ambulatory Visit: Payer: Self-pay

## 2022-03-31 ENCOUNTER — Ambulatory Visit
Admission: RE | Admit: 2022-03-31 | Discharge: 2022-03-31 | Disposition: A | Discharge status: Home / Self Care | Payer: Medicare HMO | Attending: Internal Medicine | Admitting: Internal Medicine

## 2022-03-31 ENCOUNTER — Encounter
Admission: RE | Disposition: A | Discharge status: Home / Self Care | Payer: Self-pay | Source: Home / Self Care | Attending: Internal Medicine

## 2022-03-31 ENCOUNTER — Encounter: Payer: Self-pay | Admitting: Internal Medicine

## 2022-03-31 DIAGNOSIS — K641 Second degree hemorrhoids: Secondary | ICD-10-CM | POA: Insufficient documentation

## 2022-03-31 DIAGNOSIS — K219 Gastro-esophageal reflux disease without esophagitis: Secondary | ICD-10-CM | POA: Diagnosis not present

## 2022-03-31 DIAGNOSIS — Z86010 Personal history of colonic polyps: Secondary | ICD-10-CM | POA: Diagnosis not present

## 2022-03-31 DIAGNOSIS — Z1211 Encounter for screening for malignant neoplasm of colon: Secondary | ICD-10-CM | POA: Diagnosis not present

## 2022-03-31 DIAGNOSIS — K2289 Other specified disease of esophagus: Secondary | ICD-10-CM | POA: Diagnosis not present

## 2022-03-31 DIAGNOSIS — K2970 Gastritis, unspecified, without bleeding: Secondary | ICD-10-CM | POA: Insufficient documentation

## 2022-03-31 DIAGNOSIS — R1310 Dysphagia, unspecified: Secondary | ICD-10-CM | POA: Insufficient documentation

## 2022-03-31 HISTORY — DX: Unspecified osteoarthritis, unspecified site: M19.90

## 2022-03-31 SURGERY — COLONOSCOPY
Anesthesia: General

## 2022-03-31 MED ORDER — SODIUM CHLORIDE 0.9 % IV SOLN: INTRAVENOUS | Status: DC

## 2022-03-31 MED ORDER — PROPOFOL 500 MG/50ML IV EMUL
INTRAVENOUS | Status: DC | PRN
  Administered 2022-03-31: 200 ug/kg/min via INTRAVENOUS

## 2022-03-31 MED ORDER — PROPOFOL 10 MG/ML IV BOLUS
INTRAVENOUS | Status: DC | PRN
  Administered 2022-03-31: 70 mg via INTRAVENOUS

## 2022-03-31 MED ORDER — SODIUM CHLORIDE 0.9 % IV SOLN: INTRAVENOUS | Status: DC | PRN

## 2022-03-31 NOTE — Anesthesia Preprocedure Evaluation (Signed)
Anesthesia Evaluation  Patient identified by MRN, date of birth, ID band Patient awake    Reviewed: Allergy & Precautions, H&P , NPO status , Patient's Chart, lab work & pertinent test results, reviewed documented beta blocker date and time   Airway Mallampati: II   Neck ROM: full    Dental  (+) Poor Dentition   Pulmonary neg pulmonary ROS,    Pulmonary exam normal        Cardiovascular negative cardio ROS Normal cardiovascular exam Rhythm:regular Rate:Normal     Neuro/Psych negative neurological ROS  negative psych ROS   GI/Hepatic negative GI ROS, Neg liver ROS,   Endo/Other  negative endocrine ROS  Renal/GU negative Renal ROS  negative genitourinary   Musculoskeletal   Abdominal   Peds  Hematology negative hematology ROS (+)   Anesthesia Other Findings 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 No date: Arthritis No date: Bladder polyps No date: Dysrhythmia     Comment:  PVC's No date: Fibrocystic breast disease No date: Frequent UTI No date: H/O degenerative disc disease No date: Hypercholesterolemia No date: Osteoporosis Past Surgical History: No date: ABDOMINAL HYSTERECTOMY No date: BLADDER FULGURATION No date: COLONOSCOPY 07/12/2018: COLONOSCOPY WITH PROPOFOL; N/A     Comment:  Procedure: COLONOSCOPY WITH PROPOFOL;  Surgeon: [place],               [name], MD;  Location: [facility];  Service:               Gastroenterology;  Laterality: N/A; No date: DILATION AND CURETTAGE OF UTERUS No date: OVARIAN CYST REMOVAL No date: TONSILLECTOMY AND ADENOIDECTOMY BMI    Body Mass Index: 24.86 kg/m     Reproductive/Obstetrics negative OB ROS                             Anesthesia Physical Anesthesia Plan  ASA: 2  Anesthesia Plan: General   Post-op Pain Management:    Induction:   PONV Risk Score and Plan:   Airway Management Planned:   Additional  Equipment:   Intra-op Plan:   Post-operative Plan:   Informed Consent: I have reviewed the patients History and Physical, chart, labs and discussed the procedure including the risks, benefits and alternatives for the proposed anesthesia with the patient or authorized representative who has indicated his/her understanding and acceptance.     Dental Advisory Given  Plan Discussed with: CRNA  Anesthesia Plan Comments:         Anesthesia Quick Evaluation

## 2022-03-31 NOTE — Transfer of Care (Signed)
Immediate Anesthesia Transfer of Care Note  Patient: Lindsay Fields  Procedure(s) Performed: COLONOSCOPY ESOPHAGOGASTRODUODENOSCOPY (EGD)  Patient Location: PACU  Anesthesia Type:General  Level of Consciousness: sedated  Airway & Oxygen Therapy: Patient Spontanous Breathing and Patient connected to nasal cannula oxygen  Post-op Assessment: Report given to RN and Post -op Vital signs reviewed and stable  Post vital signs: Reviewed and stable  Last Vitals:  Vitals Value Taken Time  BP 134/76 03/31/22 0948  Temp    Pulse 70 03/31/22 0947  Resp 20 03/31/22 0947  SpO2 98 % 03/31/22 0947  Vitals shown include unvalidated device data.  Last Pain:  Vitals:   03/31/22 0835  TempSrc: Temporal  PainSc: 0-No pain         Complications: No notable events documented.

## 2022-03-31 NOTE — H&P (Addendum)
Outpatient short stay form Pre-procedure 03/31/2022 9:10 AM Lindsay Fields K. Lindsay Fields, M.D.  Primary Physician: Lindsay Fields, M.D.  Reason for visit:  Personal history of adenomatous colon polyps  History of present illness:                            Patient presents for colonoscopy for a personal hx of colon polyps. The patient denies abdominal pain, abnormal weight loss or rectal bleeding.      Current Facility-Administered Medications:    0.9 %  sodium chloride infusion, , Intravenous, Continuous, Lindsay Fields, Lindsay Pike, MD, Last Rate: 20 mL/hr at 03/31/22 0850, New Bag at 03/31/22 0850  Medications Prior to Admission  Medication Sig Dispense Refill Last Dose   acetaminophen (TYLENOL) 500 MG tablet Take 1,000 mg by mouth every 6 (six) hours as needed.   Past Week   Azelastine HCl 137 MCG/SPRAY SOLN Place 137 mcg into the nose.   Past Week   bisoprolol (ZEBETA) 5 MG tablet Take 2.5 mg by mouth daily.   03/30/2022 at 0800   fluticasone (FLONASE) 50 MCG/ACT nasal spray Place 2 sprays into both nostrils 2 (two) times daily as needed for allergies or rhinitis.   Past Week   gabapentin (NEURONTIN) 100 MG capsule Take 1-2 capsules (100-200 mg total) by mouth at bedtime. 60 capsule 5 03/30/2022 at 2200   levocetirizine (XYZAL) 5 MG tablet Take 5 mg by mouth every evening.   Past Week   raloxifene (EVISTA) 60 MG tablet Take 60 mg by mouth daily.   03/30/2022   rosuvastatin (CRESTOR) 10 MG tablet Take 10 mg by mouth daily.   03/30/2022   Sod Picosulfate-Mag Ox-Cit Acd (CLENPIQ) 10-3.5-12 MG-GM -GM/160ML SOLN Take 160 mLs by mouth.   Past Week   tiZANidine (ZANAFLEX) 4 MG tablet Take 4 mg by mouth every 6 (six) hours as needed for muscle spasms.   03/30/2022   topiramate (TOPAMAX) 25 MG capsule Take 25 mg by mouth 2 (two) times daily.        No Known Allergies   Past Medical History:  Diagnosis Date   Allergic genetic state    Arthritis    Bladder polyps    Dysrhythmia    PVC's   Fibrocystic  breast disease    Frequent UTI    H/O degenerative disc disease    Hypercholesterolemia    Osteoporosis     Review of systems:  Otherwise negative.    Physical Exam  Gen: Alert, oriented. Appears stated age.  HEENT: [name]/AT. PERRLA. Lungs: CTA, no wheezes. CV: RR nl S1, S2. Abd: soft, benign, no masses. BS+ Ext: No edema. Pulses 2+    Planned procedures: Proceed with colonoscopy. The patient understands the nature of the planned procedure, indications, risks, alternatives and potential complications including but not limited to bleeding, infection, perforation, damage to internal organs and possible oversedation/side effects from anesthesia. The patient agrees and gives consent to proceed.  Please refer to procedure notes for findings, recommendations and patient disposition/instructions.     Lindsay Fields K. Lindsay Fields, M.D. Gastroenterology 03/31/2022  9:10 AM

## 2022-03-31 NOTE — Interval H&P Note (Signed)
History and Physical Interval Note:  03/31/2022 9:12 AM  Lindsay Fields  has presented today for surgery, with the diagnosis of Hx of adenomatous colonic polyps (Z86.010) Esophageal dysphagia (R13.19).  The various methods of treatment have been discussed with the patient and family. After consideration of risks, benefits and other options for treatment, the patient has consented to  Procedure(s): COLONOSCOPY (N/A) ESOPHAGOGASTRODUODENOSCOPY (EGD) (N/A) as a surgical intervention.  The patient's history has been reviewed, patient examined, no change in status, stable for surgery.  I have reviewed the patient's chart and labs.  Questions were answered to the patient's satisfaction.     [place], [place]

## 2022-03-31 NOTE — Op Note (Signed)
[facility] Patient Name: Lindsay Fields Procedure Date: 03/31/2022 9:07 AM MRN: [id number] Account #: [id number] Date of Birth: 1951-03-24 Admit Type: Outpatient Age: 71 Room: [facility] ENDO ROOM 2 Gender: Female Note Status: Finalized Instrument Name: Michaelle Birks [id number] Procedure:             Upper GI endoscopy Indications:           Dysphagia, Gastro-esophageal reflux disease Providers:             [name]. Ziyana Morikawa MD, MD Medicines:             Propofol per Anesthesia Complications:         No immediate complications. Procedure:             Pre-Anesthesia Assessment:                        - The risks and benefits of the procedure and the                         sedation options and risks were discussed with the                         patient. All questions were answered and informed                         consent was obtained.                        - Patient identification and proposed procedure were                         verified prior to the procedure by the nurse. The                         procedure was verified in the procedure room.                        - ASA Grade Assessment: III - A patient with severe                         systemic disease.                        - After reviewing the risks and benefits, the patient                         was deemed in satisfactory condition to undergo the                         procedure.                        After obtaining informed consent, the endoscope was                         passed under direct vision. Throughout the procedure,                         the patient's blood pressure, pulse, and oxygen  saturations were monitored continuously. The Endoscope                         was introduced through the mouth, and advanced to the                         third part of duodenum. The upper GI endoscopy was                         accomplished without  difficulty. The patient tolerated                         the procedure well. Findings:      Normal mucosa was found in the entire esophagus. Biopsies were obtained       from the proximal and distal esophagus with cold forceps for histology       of suspected eosinophilic esophagitis.      One benign-appearing, intrinsic mild stenosis was found at the       gastroesophageal junction. This stenosis measured 1.5 cm (inner       diameter) x less than one cm (in length). The stenosis was traversed.       The scope was withdrawn. Dilation was performed with a Maloney dilator       with no resistance at 29 Fr.      Patchy mild inflammation characterized by erythema was found in the       gastric antrum.      The cardia and gastric fundus were normal on retroflexion.      The examined duodenum was normal.      The exam was otherwise without abnormality. Impression:            - Normal mucosa was found in the entire esophagus.                         Biopsied.                        - Benign-appearing esophageal stenosis. Dilated.                        - Gastritis.                        - Normal examined duodenum.                        - The examination was otherwise normal. Recommendation:        - Await pathology results.                        - Monitor results to esophageal dilation                        - Proceed with colonoscopy Procedure Code(s):     --- Professional ---                        [id number], Esophagogastroduodenoscopy, flexible,                         transoral; with biopsy, single or multiple  43450, Dilation of esophagus, by unguided sound or                         bougie, single or multiple passes Diagnosis Code(s):     --- Professional ---                        K21.9, Gastro-esophageal reflux disease without                         esophagitis                        R13.10, Dysphagia, unspecified                        K29.70, Gastritis,  unspecified, without bleeding                        K22.2, Esophageal obstruction CPT copyright 2019 American Medical Association. All rights reserved. The codes documented in this report are preliminary and upon coder review may  be revised to meet current compliance requirements. Efrain Sella MD, MD 03/31/2022 9:33:39 AM This report has been signed electronically. Number of Addenda: 0 Note Initiated On: 03/31/2022 9:07 AM Estimated Blood Loss:  Estimated blood loss: none.      [facility]

## 2022-03-31 NOTE — Op Note (Signed)
[facility] Patient Name: Lindsay Fields Procedure Date: 03/31/2022 9:07 AM MRN: [id number] Account #: [id number] Date of Birth: 1951-04-17 Admit Type: Outpatient Age: 71 Room: [facility] ENDO ROOM 2 Gender: Female Note Status: Finalized Instrument Name: Jasper Riling [id number] Procedure:             Colonoscopy Indications:           Surveillance: Personal history of adenomatous polyps                         on last colonoscopy > 5 years ago Providers:             Lorie Apley K. Ahnya Akre MD, MD Medicines:             Propofol per Anesthesia Complications:         No immediate complications. Procedure:             Pre-Anesthesia Assessment:                        - The risks and benefits of the procedure and the                         sedation options and risks were discussed with the                         patient. All questions were answered and informed                         consent was obtained.                        - Patient identification and proposed procedure were                         verified prior to the procedure by the nurse. The                         procedure was verified in the procedure room.                        - ASA Grade Assessment: III - A patient with severe                         systemic disease.                        - After reviewing the risks and benefits, the patient                         was deemed in satisfactory condition to undergo the                         procedure.                        After obtaining informed consent, the colonoscope was                         passed under direct vision. Throughout the procedure,  the patient's blood pressure, pulse, and oxygen                         saturations were monitored continuously. The                         Colonoscope was introduced through the anus and                         advanced to the the cecum, identified by appendiceal                          orifice and ileocecal valve. The colonoscopy was                         performed without difficulty. The patient tolerated                         the procedure well. The quality of the bowel                         preparation was good. The ileocecal valve, appendiceal                         orifice, and rectum were photographed. Findings:      The perianal and digital rectal examinations were normal. Pertinent       negatives include normal sphincter tone and no palpable rectal lesions.      Non-bleeding internal hemorrhoids were found during retroflexion. The       hemorrhoids were Grade II (internal hemorrhoids that prolapse but reduce       spontaneously).      The colon (entire examined portion) appeared normal. Impression:            - Non-bleeding internal hemorrhoids.                        - The entire examined colon is normal.                        - No specimens collected. Recommendation:        - Await pathology results from EGD, also performed                         today.                        - Monitor results to esophageal dilation                        - Patient has a contact number available for                         emergencies. The signs and symptoms of potential                         delayed complications were discussed with the patient.                         Return to normal activities tomorrow. Written  discharge instructions were provided to the patient.                        - Resume previous diet.                        - Continue present medications.                        - No repeat colonoscopy due to current age (67 years                         or older) and the absence of colonic polyps.                        - Return to nurse practitioner in 3 months.                        - Follow up with Dawson Bills, NP at [facility]                         Gastroenterology.                        - Telephone GI  office to schedule appointment.                        - The findings and recommendations were discussed with                         the patient. Procedure Code(s):     --- Professional ---                        Z5638, Colorectal cancer screening; colonoscopy on                         individual at high risk Diagnosis Code(s):     --- Professional ---                        K64.1, Second degree hemorrhoids                        Z86.010, Personal history of colonic polyps CPT copyright 2019 American Medical Association. All rights reserved. The codes documented in this report are preliminary and upon coder review may  be revised to meet current compliance requirements. Efrain Sella MD, MD 03/31/2022 9:48:08 AM This report has been signed electronically. Number of Addenda: 0 Note Initiated On: 03/31/2022 9:07 AM Scope Withdrawal Time: 0 hours 3 minutes 35 seconds  Total Procedure Duration: 0 hours 8 minutes 3 seconds  Estimated Blood Loss:  Estimated blood loss: none.      [facility]

## 2022-03-31 NOTE — Anesthesia Postprocedure Evaluation (Signed)
Anesthesia Post Note  Patient: Lindsay Fields  Procedure(s) Performed: COLONOSCOPY ESOPHAGOGASTRODUODENOSCOPY (EGD)  Patient location during evaluation: PACU Anesthesia Type: General Level of consciousness: awake and alert Pain management: pain level controlled Vital Signs Assessment: post-procedure vital signs reviewed and stable Respiratory status: spontaneous breathing, nonlabored ventilation, respiratory function stable and patient connected to nasal cannula oxygen Cardiovascular status: blood pressure returned to baseline and stable Postop Assessment: no apparent nausea or vomiting Anesthetic complications: no   No notable events documented.   Last Vitals:  Vitals:   03/31/22 0947 03/31/22 0957  BP:  (!) 142/73  Pulse:  68  Resp:    Temp: (!) 35.8 C   SpO2:  100%    Last Pain:  Vitals:   03/31/22 1007  TempSrc:   PainSc: 0-No pain                 Molli Barrows

## 2022-03-31 NOTE — Anesthesia Procedure Notes (Signed)
Date/Time: 03/31/2022 9:12 AM  Performed by: Nelda Marseille, CRNAPre-anesthesia Checklist: Patient identified, Emergency Drugs available, Suction available, Patient being monitored and Timeout performed Oxygen Delivery Method: Nasal cannula

## 2022-04-01 ENCOUNTER — Encounter: Payer: Self-pay | Admitting: Internal Medicine

## 2022-04-01 LAB — SURGICAL PATHOLOGY

## 2022-05-03 ENCOUNTER — Other Ambulatory Visit: Payer: Self-pay | Admitting: Surgery

## 2022-05-03 DIAGNOSIS — M24152 Other articular cartilage disorders, left hip: Secondary | ICD-10-CM | POA: Insufficient documentation

## 2022-05-03 DIAGNOSIS — S76012A Strain of muscle, fascia and tendon of left hip, initial encounter: Secondary | ICD-10-CM

## 2022-05-03 DIAGNOSIS — M25552 Pain in left hip: Secondary | ICD-10-CM

## 2022-05-03 DIAGNOSIS — M47816 Spondylosis without myelopathy or radiculopathy, lumbar region: Secondary | ICD-10-CM | POA: Insufficient documentation

## 2022-05-13 ENCOUNTER — Ambulatory Visit
Admission: RE | Admit: 2022-05-13 | Discharge: 2022-05-13 | Disposition: A | Discharge status: Home / Self Care | Payer: Medicare HMO | Source: Ambulatory Visit | Attending: Surgery | Admitting: Surgery

## 2022-05-13 DIAGNOSIS — S76012A Strain of muscle, fascia and tendon of left hip, initial encounter: Secondary | ICD-10-CM | POA: Diagnosis present

## 2022-05-13 DIAGNOSIS — M25552 Pain in left hip: Secondary | ICD-10-CM | POA: Diagnosis present

## 2022-05-25 ENCOUNTER — Other Ambulatory Visit: Payer: Self-pay | Admitting: Internal Medicine

## 2022-05-25 DIAGNOSIS — Z1231 Encounter for screening mammogram for malignant neoplasm of breast: Secondary | ICD-10-CM

## 2022-05-31 DIAGNOSIS — M1612 Unilateral primary osteoarthritis, left hip: Secondary | ICD-10-CM | POA: Insufficient documentation

## 2022-06-21 ENCOUNTER — Ambulatory Visit
Admission: RE | Admit: 2022-06-21 | Discharge: 2022-06-21 | Disposition: A | Discharge status: Home / Self Care | Payer: Medicare HMO | Source: Ambulatory Visit | Attending: Internal Medicine | Admitting: Internal Medicine

## 2022-06-21 DIAGNOSIS — Z1231 Encounter for screening mammogram for malignant neoplasm of breast: Secondary | ICD-10-CM | POA: Insufficient documentation

## 2022-07-06 ENCOUNTER — Ambulatory Visit
Payer: Medicare HMO | Attending: Student in an Organized Health Care Education/Training Program | Admitting: Student in an Organized Health Care Education/Training Program

## 2022-07-06 ENCOUNTER — Encounter: Payer: Self-pay | Admitting: Student in an Organized Health Care Education/Training Program

## 2022-07-06 VITALS — BP 125/69 | HR 64 | Temp 97.2°F | Resp 16 | Ht 66.0 in | Wt 155.0 lb

## 2022-07-06 DIAGNOSIS — M7138 Other bursal cyst, other site: Secondary | ICD-10-CM | POA: Insufficient documentation

## 2022-07-06 DIAGNOSIS — G894 Chronic pain syndrome: Secondary | ICD-10-CM | POA: Diagnosis present

## 2022-07-06 DIAGNOSIS — M47816 Spondylosis without myelopathy or radiculopathy, lumbar region: Secondary | ICD-10-CM | POA: Insufficient documentation

## 2022-07-06 DIAGNOSIS — M5416 Radiculopathy, lumbar region: Secondary | ICD-10-CM | POA: Diagnosis present

## 2022-07-06 MED ORDER — GABAPENTIN 300 MG PO CAPS: 300.0000 mg | ORAL_CAPSULE | Freq: Every day | ORAL | 2 refills | Status: DC

## 2022-07-06 NOTE — Progress Notes (Signed)
PROVIDER NOTE: Information contained herein reflects review and annotations entered in association with encounter. Interpretation of such information and data should be left to medically-trained personnel. Information provided to patient can be located elsewhere in the medical record under "Patient Instructions". Document created using STT-dictation technology, any transcriptional errors that may result from process are unintentional.    Patient: Lindsay Fields  Service Category: E/M  Provider: Gillis Santa, MD  DOB: 1950-12-09  DOS: 07/06/2022  Specialty: Interventional Pain Management  MRN: [id number]  Setting: Ambulatory outpatient  PCP: Kirk Ruths, MD  Type: Established Patient    Referring Provider: Kirk Ruths, MD  Location: Office  Delivery: Face-to-face     HPI  Lindsay Fields, a 71 y.o. year old female, is here today because of her Lumbar radicular pain [M54.16]. Lindsay Fields primary complain today is Back Pain (low) Last encounter: My last encounter with her was on 12/09/2021. Pertinent problems: Lindsay Fields has Spondylosis without myelopathy or radiculopathy, lumbar region and Chronic pain syndrome on their pertinent problem list. Pain Assessment: Severity of   is reported as a 6 /10. Location: Back Lower/radiates down left leg in the front and back to toes. Onset: More than a month ago. Quality: Tightness, Cramping. Timing: Constant. Modifying factor(s): rest. Vitals:  height is 5' 6"  (1.676 m) and weight is 155 lb (70.3 kg). Her temperature is 97.2 F (36.2 C) (abnormal). Her blood pressure is 125/69 and her pulse is 64. Her respiration is 16 and oxygen saturation is 100%.   Reason for encounter: increased left LE dermatomal pain.  Previous MRI was reviewed with patient which shows synovial cyst at left L5-S1.  We discussed a left L5-S1 epidural steroid injection.  Also recommend that she increase her gabapentin to 300 mg nightly.  She had hallucinations with tizanidine and has  discontinued.  She also discontinued her Topamax because it was not effective.   ROS  Constitutional: Denies any fever or chills Gastrointestinal: No reported hemesis, hematochezia, vomiting, or acute GI distress Musculoskeletal:  Left low back pain with radiation into left leg Neurological: No reported episodes of acute onset apraxia, aphasia, dysarthria, agnosia, amnesia, paralysis, loss of coordination, or loss of consciousness  Medication Review  Azelastine HCl, Sod Picosulfate-Mag Ox-Cit Acd, acetaminophen, bisoprolol, fluticasone, gabapentin, levocetirizine, raloxifene, and rosuvastatin  History Review  Allergy: Lindsay Fields has No Known Allergies. Drug: Lindsay Fields  reports no history of drug use. Alcohol:  reports current alcohol use of about 3.0 - 5.0 standard drinks of alcohol per week. Tobacco:  reports that she has never smoked. She has never used smokeless tobacco. Social: Lindsay Fields  reports that she has never smoked. She has never used smokeless tobacco. She reports current alcohol use of about 3.0 - 5.0 standard drinks of alcohol per week. She reports that she does not use drugs. Medical:  has a past medical history of Allergic genetic state, Arthritis, Bladder polyps, Dysrhythmia, Fibrocystic breast disease, Frequent UTI, H/O degenerative disc disease, Hypercholesterolemia, and Osteoporosis. Surgical: Lindsay Fields  has a past surgical history that includes Bladder fulguration; Colonoscopy; Dilation and curettage of uterus; Abdominal hysterectomy; Ovarian cyst removal; Tonsillectomy and adenoidectomy; Colonoscopy with propofol (N/A, 07/12/2018); Colonoscopy (N/A, 03/31/2022); and Esophagogastroduodenoscopy (N/A, 03/31/2022). Family: family history includes Breast cancer (age of onset: 46) in her maternal aunt.  Laboratory Chemistry Profile   Renal No results found for: "BUN", "CREATININE", "LABCREA", "BCR", "GFR", "GFRAA", "GFRNONAA", "LABVMA", "EPIRU", "VQXIHWT88EKC", "NOREPRU",  "NOREPI24HUR", "DOPARU", "DOPAM24HRUR"  Hepatic No results found  for: "AST", "ALT", "ALBUMIN", "ALKPHOS", "HCVAB", "AMYLASE", "LIPASE", "AMMONIA"  Electrolytes No results found for: "NA", "K", "CL", "CALCIUM", "MG", "PHOS"  Bone No results found for: "VD25OH", "VD125OH2TOT", "MW1027OZ3", "GU4403KV4", "25OHVITD1", "25OHVITD2", "25OHVITD3", "TESTOFREE", "TESTOSTERONE"  Inflammation (CRP: Acute Phase) (ESR: Chronic Phase) No results found for: "CRP", "ESRSEDRATE", "LATICACIDVEN"       Note: Above Lab results reviewed.   Physical Exam  General appearance: Well nourished, well developed, and well hydrated. In no apparent acute distress Mental status: Alert, oriented x 3 (person, place, & time)       Respiratory: No evidence of acute respiratory distress Eyes: PERLA Vitals: BP 125/69   Pulse 64   Temp (!) 97.2 F (36.2 C)   Resp 16   Ht 5' 6"  (1.676 m)   Wt 155 lb (70.3 kg)   SpO2 100%   BMI 25.02 kg/m  BMI: Estimated body mass index is 25.02 kg/m as calculated from the following:   Height as of this encounter: 5' 6"  (1.676 m).   Weight as of this encounter: 155 lb (70.3 kg). Ideal: Ideal body weight: 59.3 kg (130 lb 11.7 oz) Adjusted ideal body weight: 63.7 kg (140 lb 7 oz)  Left low back pain with radiation into left leg 5 out of 5 strength bilateral lower extremity: Plantar flexion, dorsiflexion, knee flexion, knee extension.   Assessment   Diagnosis Status  1. Lumbar radicular pain (LEFT L5/S1)   2. 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 Chronic pain syndrome   4. Lumbar facet arthropathy   5. Spondylosis without myelopathy or radiculopathy, lumbar region     Persistent Persistent Having a Flare-up    Plan of Care  Increase gabapentin as below, left L5-S1 epidural steroid injection to help manage left lumbar radicular pain   Pharmacotherapy (Medications Ordered): Meds ordered this encounter  Medications   gabapentin (NEURONTIN) 300 MG capsule     Sig: Take 1 capsule (300 mg total) by mouth at bedtime.    Dispense:  90 capsule    Refill:  2    Fill one day early if pharmacy is closed on scheduled refill date. May substitute for generic if available.   Orders:  Orders Placed This Encounter  Procedures   Lumbar Epidural Injection    Standing Status:   Future    Standing Expiration Date:   10/05/2022    Scheduling Instructions:     LEFT L5/S1 ESI    Order Specific Question:   Where will this procedure be performed?    Answer:   [facility]   Follow-up plan:   Return in about 2 weeks (around 07/20/2022) for Left L5/S1 ESI , in clinic [facility].    Recent Visits No visits were found meeting these conditions. Showing recent visits within past 90 days and meeting all other requirements Today's Visits Date Type Provider Dept  07/06/22 Office Visit Gillis Santa, MD [facility]  Showing today's visits and meeting all other requirements Future Appointments No visits were found meeting these conditions. Showing future appointments within next 90 days and meeting all other requirements  I discussed the assessment and treatment plan with the patient. The patient was provided an opportunity to ask questions and all were answered. The patient agreed with the plan and demonstrated an understanding of the instructions.  Patient advised to call back or seek an in-person evaluation if the symptoms or condition worsens.  Duration of encounter: 47mnutes.  Note by: BGillis Santa MD Date: 07/06/2022;  Time: 11:31 AM

## 2022-07-06 NOTE — Progress Notes (Signed)
Safety precautions to be maintained throughout the outpatient stay will include: orient to surroundings, keep bed in low position, maintain call bell within reach at all times, provide assistance with transfer out of bed and ambulation.  

## 2022-07-06 NOTE — Patient Instructions (Signed)
Consider Magnesium for Leg cramps at night, [phone] mg

## 2022-07-14 ENCOUNTER — Other Ambulatory Visit: Payer: Self-pay

## 2022-07-14 ENCOUNTER — Telehealth: Payer: Self-pay | Admitting: Student in an Organized Health Care Education/Training Program

## 2022-07-14 NOTE — Telephone Encounter (Signed)
Spoke

## 2022-07-14 NOTE — Telephone Encounter (Signed)
PT stated that she is on Gabapentin for pain. PT is having trimmers in left hands it's getting worse. PT is now having trimmers in left leg as well. Please give patient a call. Thanks

## 2022-07-14 NOTE — Telephone Encounter (Signed)
Attempted to call patient, message left. 

## 2022-07-14 NOTE — Telephone Encounter (Signed)
Patient states that she is having tremors in her left hand and left leg.  She states she has increased her Gabapentin from 100 mg to 300 mg and has noticed it has gotten worse. Spoke with DR Holley Raring and he states for the patient to go to 100 mg of Gabapentin only at night to see if the tremors improve.  Instructed patient to call for any further questions or concerns.

## 2022-08-02 ENCOUNTER — Ambulatory Visit
Admission: RE | Admit: 2022-08-02 | Discharge: 2022-08-02 | Disposition: A | Discharge status: Home / Self Care | Payer: Medicare HMO | Source: Ambulatory Visit | Attending: Student in an Organized Health Care Education/Training Program | Admitting: Student in an Organized Health Care Education/Training Program

## 2022-08-02 ENCOUNTER — Encounter: Payer: Self-pay | Admitting: Student in an Organized Health Care Education/Training Program

## 2022-08-02 ENCOUNTER — Ambulatory Visit
Payer: Medicare HMO | Attending: Student in an Organized Health Care Education/Training Program | Admitting: Student in an Organized Health Care Education/Training Program

## 2022-08-02 DIAGNOSIS — M5416 Radiculopathy, lumbar region: Secondary | ICD-10-CM | POA: Diagnosis present

## 2022-08-02 DIAGNOSIS — G894 Chronic pain syndrome: Secondary | ICD-10-CM | POA: Insufficient documentation

## 2022-08-02 MED ORDER — DEXAMETHASONE SODIUM PHOSPHATE 10 MG/ML IJ SOLN
10.0000 mg | Freq: Once | INTRAMUSCULAR | Status: AC
  Administered 2022-08-02: 10 mg
  Filled 2022-08-02: qty 1

## 2022-08-02 MED ORDER — SODIUM CHLORIDE (PF) 0.9 % IJ SOLN
INTRAMUSCULAR | Status: AC
  Filled 2022-08-02: qty 10

## 2022-08-02 MED ORDER — LIDOCAINE HCL 2 % IJ SOLN
20.0000 mL | Freq: Once | INTRAMUSCULAR | Status: AC
  Administered 2022-08-02: 100 mg
  Filled 2022-08-02: qty 40

## 2022-08-02 MED ORDER — LIDOCAINE HCL 2 % IJ SOLN
INTRAMUSCULAR | Status: AC
  Filled 2022-08-02: qty 20

## 2022-08-02 MED ORDER — PREGABALIN 25 MG PO CAPS: ORAL_CAPSULE | ORAL | 0 refills | Status: DC

## 2022-08-02 MED ORDER — SODIUM CHLORIDE 0.9% FLUSH
2.0000 mL | Freq: Once | INTRAVENOUS | Status: AC
  Administered 2022-08-02: 2 mL

## 2022-08-02 MED ORDER — IOHEXOL 180 MG/ML  SOLN
10.0000 mL | Freq: Once | INTRAMUSCULAR | Status: AC
  Administered 2022-08-02: 10 mL via EPIDURAL
  Filled 2022-08-02: qty 20

## 2022-08-02 MED ORDER — ROPIVACAINE HCL 2 MG/ML IJ SOLN
INTRAMUSCULAR | Status: AC
  Filled 2022-08-02: qty 20

## 2022-08-02 MED ORDER — ROPIVACAINE HCL 2 MG/ML IJ SOLN
2.0000 mL | Freq: Once | INTRAMUSCULAR | Status: AC
  Administered 2022-08-02: 2 mL via EPIDURAL
  Filled 2022-08-02: qty 20

## 2022-08-02 MED ORDER — DEXAMETHASONE SODIUM PHOSPHATE 10 MG/ML IJ SOLN
INTRAMUSCULAR | Status: AC
  Filled 2022-08-02: qty 1

## 2022-08-02 MED ORDER — IOHEXOL 180 MG/ML  SOLN
INTRAMUSCULAR | Status: AC
  Filled 2022-08-02: qty 20

## 2022-08-02 NOTE — Progress Notes (Signed)
PROVIDER NOTE: Interpretation of information contained herein should be left to medically-trained personnel. Specific patient instructions are provided elsewhere under "Patient Instructions" section of medical record. This document was created in part using STT-dictation technology, any transcriptional errors that may result from this process are unintentional.  Patient: Lindsay Fields Type: Established DOB: December 29, 1950 MRN: [id number] PCP: Kirk Ruths, MD  Service: Procedure DOS: 08/02/2022 Setting: Ambulatory Location: Ambulatory outpatient facility Delivery: Face-to-face Provider: Gillis Santa, MD Specialty: Interventional Pain Management Specialty designation: 09 Location: Outpatient facility Ref. Prov.: Gillis Santa, MD    Primary Reason for Visit: Interventional Pain Management Treatment. CC: Back Pain (lower)   Procedure:           Type: Lumbar epidural steroid injection (LESI) (interlaminar) #1    Laterality: Left   Level:  L4-5 Level.  Imaging: Fluoroscopic guidance         Anesthesia: Local anesthesia (1-2% Lidocaine) DOS: 08/02/2022  Performed by: Gillis Santa, MD  Purpose: Diagnostic/Therapeutic Indications: Lumbar radicular pain of intraspinal etiology of more than 4 weeks that has failed to respond to conservative therapy and is severe enough to impact quality of life or function. 1. Chronic pain syndrome   2. Lumbar radicular pain (LEFT L5/S1)    NAS-11 Pain score:   Pre-procedure: 6 /10   Post-procedure: 2 /10      Position / Prep / Materials:  Position: Prone w/ head of the table raised (slight reverse trendelenburg) to facilitate breathing.  Prep solution: DuraPrep (Iodine Povacrylex [0.7% available iodine] and Isopropyl Alcohol, 74% w/w) Prep Area: Entire Posterior Lumbar Region from lower scapular tip down to mid buttocks area and from flank to flank. Materials:  Tray: Epidural tray Needle(s):  Type: Epidural needle (Tuohy) Gauge (G):   22 Length: Regular (3.5-in) Qty: 1  Pre-op H&P Assessment:  Lindsay Fields is a 71 y.o. (year old), female patient, seen today for interventional treatment. She  has a past surgical history that includes Bladder fulguration; Colonoscopy; Dilation and curettage of uterus; Abdominal hysterectomy; Ovarian cyst removal; Tonsillectomy and adenoidectomy; Colonoscopy with propofol (N/A, 07/12/2018); Colonoscopy (N/A, 03/31/2022); and Esophagogastroduodenoscopy (N/A, 03/31/2022). Lindsay Fields has a current medication list which includes the following prescription(s): acetaminophen, azelastine hcl, bisoprolol, fluticasone, levocetirizine, pregabalin, raloxifene, and rosuvastatin. Her primarily concern today is the Back Pain (lower)  Initial Vital Signs:  Pulse/HCG Rate: 64ECG Heart Rate: 66 Temp: (!) 97.2 F (36.2 C) Resp: 17 BP: (!) 147/74 SpO2: 99 %  BMI: Estimated body mass index is 24.86 kg/m as calculated from the following:   Height as of this encounter: '5\' 6"'$  (1.676 m).   Weight as of this encounter: 154 lb (69.9 kg).  Risk Assessment: Allergies: Reviewed. She has No Known Allergies.  Allergy Precautions: None required Coagulopathies: Reviewed. None identified.  Blood-thinner therapy: None at this time Active Infection(s): Reviewed. None identified. Lindsay Fields is afebrile  Site Confirmation: Lindsay Fields was asked to confirm the procedure and laterality before marking the site Procedure checklist: Completed Consent: Before the procedure and under the influence of no sedative(s), amnesic(s), or anxiolytics, the patient was informed of the treatment options, risks and possible complications. To fulfill our ethical and legal obligations, as recommended by the American Medical Association's Code of Ethics, I have informed the patient of my clinical impression; the nature and purpose of the treatment or procedure; the risks, benefits, and possible complications of the intervention; the alternatives, including  doing nothing; the risk(s) and benefit(s) of the alternative treatment(s) or procedure(s); and the  risk(s) and benefit(s) of doing nothing. The patient was provided information about the general risks and possible complications associated with the procedure. These may include, but are not limited to: failure to achieve desired goals, infection, bleeding, organ or nerve damage, allergic reactions, paralysis, and death. In addition, the patient was informed of those risks and complications associated to Spine-related procedures, such as failure to decrease pain; infection (i.e.: Meningitis, epidural or intraspinal abscess); bleeding (i.e.: epidural hematoma, subarachnoid hemorrhage, or any other type of intraspinal or peri-dural bleeding); organ or nerve damage (i.e.: Any type of peripheral nerve, nerve root, or spinal cord injury) with subsequent damage to sensory, motor, and/or autonomic systems, resulting in permanent pain, numbness, and/or weakness of one or several areas of the body; allergic reactions; (i.e.: anaphylactic reaction); and/or death. Furthermore, the patient was informed of those risks and complications associated with the medications. These include, but are not limited to: allergic reactions (i.e.: anaphylactic or anaphylactoid reaction(s)); adrenal axis suppression; blood sugar elevation that in diabetics may result in ketoacidosis or comma; water retention that in patients with history of congestive heart failure may result in shortness of breath, pulmonary edema, and decompensation with resultant heart failure; weight gain; swelling or edema; medication-induced neural toxicity; particulate matter embolism and blood vessel occlusion with resultant organ, and/or nervous system infarction; and/or aseptic necrosis of one or more joints. Finally, the patient was informed that Medicine is not an exact science; therefore, there is also the possibility of unforeseen or unpredictable risks and/or  possible complications that may result in a catastrophic outcome. The patient indicated having understood very clearly. We have given the patient no guarantees and we have made no promises. Enough time was given to the patient to ask questions, all of which were answered to the patient's satisfaction. Ms. Mancillas has indicated that she wanted to continue with the procedure. Attestation: I, the ordering provider, attest that I have discussed with the patient the benefits, risks, side-effects, alternatives, likelihood of achieving goals, and potential problems during recovery for the procedure that I have provided informed consent. Date  Time: 08/02/2022  9:37 AM  Pre-Procedure Preparation:  Monitoring: As per clinic protocol. Respiration, ETCO2, SpO2, BP, heart rate and rhythm monitor placed and checked for adequate function Safety Precautions: Patient was assessed for positional comfort and pressure points before starting the procedure. Time-out: I initiated and conducted the "Time-out" before starting the procedure, as per protocol. The patient was asked to participate by confirming the accuracy of the "Time Out" information. Verification of the correct person, site, and procedure were performed and confirmed by me, the nursing staff, and the patient. "Time-out" conducted as per Joint Commission's Universal Protocol (UP.01.01.01). Time: 1011  Description/Narrative of Procedure:          Target: Epidural space via interlaminar opening, initially targeting the lower laminar border of the superior vertebral body. Region: Lumbar Approach: Percutaneous paravertebral  Rationale (medical necessity): procedure needed and proper for the diagnosis and/or treatment of the patient's medical symptoms and needs. Procedural Technique Safety Precautions: Aspiration looking for blood return was conducted prior to all injections. At no point did we inject any substances, as a needle was being advanced. No attempts were  made at seeking any paresthesias. Safe injection practices and needle disposal techniques used. Medications properly checked for expiration dates. SDV (single dose vial) medications used. Description of the Procedure: Protocol guidelines were followed. The procedure needle was introduced through the skin, ipsilateral to the reported pain, and advanced to the target  area. Bone was contacted and the needle walked caudad, until the lamina was cleared. The epidural space was identified using "loss-of-resistance technique" with 2-3 ml of PF-NaCl (0.9% NSS), in a 5cc LOR glass syringe.  Vitals:   08/02/22 1010 08/02/22 1015 08/02/22 1020 08/02/22 1021  BP: (!) 133/95 (!) 136/90 (!) 142/86 (!) 145/88  Pulse:      Resp: '18 18 18 18  '$ Temp:      TempSrc:      SpO2: 98% 95% 96% 96%  Weight:      Height:        Start Time: 1011 hrs. End Time: 1021 hrs.  Imaging Guidance (Spinal):          Type of Imaging Technique: Fluoroscopy Guidance (Spinal) Indication(s): Assistance in needle guidance and placement for procedures requiring needle placement in or near specific anatomical locations not easily accessible without such assistance. Exposure Time: Please see nurses notes. Contrast: Before injecting any contrast, we confirmed that the patient did not have an allergy to iodine, shellfish, or radiological contrast. Once satisfactory needle placement was completed at the desired level, radiological contrast was injected. Contrast injected under live fluoroscopy. No contrast complications. See chart for type and volume of contrast used. Fluoroscopic Guidance: I was personally present during the use of fluoroscopy. "Tunnel Vision Technique" used to obtain the best possible view of the target area. Parallax error corrected before commencing the procedure. "Direction-depth-direction" technique used to introduce the needle under continuous pulsed fluoroscopy. Once target was reached, antero-posterior, oblique, and  lateral fluoroscopic projection used confirm needle placement in all planes. Images permanently stored in EMR. Interpretation: I personally interpreted the imaging intraoperatively. Adequate needle placement confirmed in multiple planes. Appropriate spread of contrast into desired area was observed. No evidence of afferent or efferent intravascular uptake. No intrathecal or subarachnoid spread observed. Permanent images saved into the patient's record.  Antibiotic Prophylaxis:   Anti-infectives (From admission, onward)    None      Indication(s): None identified  Post-operative Assessment:  Post-procedure Vital Signs:  Pulse/HCG Rate: 6465 Temp: (!) 97.2 F (36.2 C) Resp: 18 BP: (!) 145/88 SpO2: 96 %  EBL: None  Complications: No immediate post-treatment complications observed by team, or reported by patient.  Note: The patient tolerated the entire procedure well. A repeat set of vitals were taken after the procedure and the patient was kept under observation following institutional policy, for this type of procedure. Post-procedural neurological assessment was performed, showing return to baseline, prior to discharge. The patient was provided with post-procedure discharge instructions, including a section on how to identify potential problems. Should any problems arise concerning this procedure, the patient was given instructions to immediately contact us, at any time, without hesitation. In any case, we plan to contact the patient by telephone for a follow-up status report regarding this interventional procedure.  Comments:  No additional relevant information.  Plan of Care  Orders:  Orders Placed This Encounter  Procedures   DG PAIN CLINIC C-ARM 1-60 MIN NO REPORT    Intraoperative interpretation by procedural physician at [place].    Standing Status:   Standing    Number of Occurrences:   1    Order Specific Question:   Reason for exam:    Answer:    Assistance in needle guidance and placement for procedures requiring needle placement in or near specific anatomical locations not easily accessible without such assistance.    Medications ordered for procedure: Meds ordered this encounter  Medications   pregabalin (LYRICA) 25 MG capsule    Sig: Take 1 capsule (25 mg total) by mouth at bedtime for 15 days, THEN 2 capsules (50 mg total) at bedtime.    Dispense:  105 capsule    Refill:  0   iohexol (OMNIPAQUE) 180 MG/ML injection 10 mL    Must be Myelogram-compatible. If not available, you may substitute with a water-soluble, non-ionic, hypoallergenic, myelogram-compatible radiological contrast medium.   lidocaine (XYLOCAINE) 2 % (with pres) injection 400 mg   sodium chloride flush (NS) 0.9 % injection 2 mL   ropivacaine (PF) 2 mg/mL (0.2%) (NAROPIN) injection 2 mL   dexamethasone (DECADRON) injection 10 mg   Medications administered: We administered iohexol, lidocaine, sodium chloride flush, ropivacaine (PF) 2 mg/mL (0.2%), and dexamethasone.  See the medical record for exact dosing, route, and time of administration.  Follow-up plan:   Return in about 4 weeks (around 08/30/2022) for Medication Management, Post Procedure Evaluation, in person.       Status post left L4-L5 Sprint peripheral nerve stimulation, left L4-L5 epidural steroid injection 08/02/2022    Recent Visits Date Type Provider Dept  07/06/22 Office Visit Gillis Santa, MD [facility]  Showing recent visits within past 90 days and meeting all other requirements Today's Visits Date Type Provider Dept  08/02/22 Procedure visit Gillis Santa, MD [facility]  Showing today's visits and meeting all other requirements Future Appointments Date Type Provider Dept  09/01/22 Appointment Gillis Santa, MD [facility]  Showing future appointments within next 90 days and meeting all other requirements  Disposition: Discharge home  Discharge (Date   Time): 08/02/2022; 1030 hrs.   Primary Care Physician: Kirk Ruths, MD Location: [facility] Note by: Gillis Santa, MD Date: 08/02/2022; Time: 10:57 AM  Disclaimer:  Medicine is not an exact science. The only guarantee in medicine is that nothing is guaranteed. It is important to note that the decision to proceed with this intervention was based on the information collected from the patient. The Data and conclusions were drawn from the patient's questionnaire, the interview, and the physical examination. Because the information was provided in large part by the patient, it cannot be guaranteed that it has not been purposely or unconsciously manipulated. Every effort has been made to obtain as much relevant data as possible for this evaluation. It is important to note that the conclusions that lead to this procedure are derived in large part from the available data. Always take into account that the treatment will also be dependent on availability of resources and existing treatment guidelines, considered by other Pain Management Practitioners as being common knowledge and practice, at the time of the intervention. For Medico-Legal purposes, it is also important to point out that variation in procedural techniques and pharmacological choices are the acceptable norm. The indications, contraindications, technique, and results of the above procedure should only be interpreted and judged by a Board-Certified Interventional Pain Specialist with extensive familiarity and expertise in the same exact procedure and technique.

## 2022-08-02 NOTE — Patient Instructions (Signed)
Pain Management Discharge Instructions  General Discharge Instructions :  If you need to reach your doctor call: Monday-Friday 8:00 am - 4:00 pm at [phone] or toll free [phone].  After clinic hours [phone] to have operator reach doctor.  Bring all of your medication bottles to all your appointments in the pain clinic.  To cancel or reschedule your appointment with Pain Management please remember to call 24 hours in advance to avoid a fee.  Refer to the educational materials which you have been given on: General Risks, I had my Procedure. Discharge Instructions, Post Sedation.  Post Procedure Instructions:  The drugs you were given will stay in your system until tomorrow, so for the next 24 hours you should not drive, make any legal decisions or drink any alcoholic beverages.  You may eat anything you prefer, but it is better to start with liquids then soups and crackers, and gradually work up to solid foods.  Please notify your doctor immediately if you have any unusual bleeding, trouble breathing or pain that is not related to your normal pain.  Depending on the type of procedure that was done, some parts of your body may feel week and/or numb.  This usually clears up by tonight or the next day.  Walk with the use of an assistive device or accompanied by an adult for the 24 hours.  You may use ice on the affected area for the first 24 hours.  Put ice in a Ziploc bag and cover with a towel and place against area 15 minutes on 15 minutes off.  You may switch to heat after 24 hours.Epidural Steroid Injection Patient Information  Description: The epidural space surrounds the nerves as they exit the spinal cord.  In some patients, the nerves can be compressed and inflamed by a bulging disc or a tight spinal canal (spinal stenosis).  By injecting steroids into the epidural space, we can bring irritated nerves into direct contact with a potentially helpful medication.  These  steroids act directly on the irritated nerves and can reduce swelling and inflammation which often leads to decreased pain.  Epidural steroids may be injected anywhere along the spine and from the neck to the low back depending upon the location of your pain.   After numbing the skin with local anesthetic (like Novocaine), a small needle is passed into the epidural space slowly.  You may experience a sensation of pressure while this is being done.  The entire block usually last less than 10 minutes.  Conditions which may be treated by epidural steroids:  Low back and leg pain Neck and arm pain Spinal stenosis Post-laminectomy syndrome Herpes zoster (shingles) pain Pain from compression fractures  Preparation for the injection:  Do not eat any solid food or dairy products within 8 hours of your appointment.  You may drink clear liquids up to 3 hours before appointment.  Clear liquids include water, black coffee, juice or soda.  No milk or cream please. You may take your regular medication, including pain medications, with a sip of water before your appointment  Diabetics should hold regular insulin (if taken separately) and take 1/2 normal NPH dos the morning of the procedure.  Carry some sugar containing items with you to your appointment. A driver must accompany you and be prepared to drive you home after your procedure.  Bring all your current medications with your. An IV may be inserted and sedation may be given at the discretion of the physician.     A blood pressure cuff, EKG and other monitors will often be applied during the procedure.  Some patients may need to have extra oxygen administered for a short period. You will be asked to provide medical information, including your allergies, prior to the procedure.  We must know immediately if you are taking blood thinners (like Coumadin/Warfarin)  Or if you are allergic to IV iodine contrast (dye). We must know if you could possible be  pregnant.  Possible side-effects: Bleeding from needle site Infection (rare, may require surgery) Nerve injury (rare) Numbness & tingling (temporary) Difficulty urinating (rare, temporary) Spinal headache ( a headache worse with upright posture) Light -headedness (temporary) Pain at injection site (several days) Decreased blood pressure (temporary) Weakness in arm/leg (temporary) Pressure sensation in back/neck (temporary)  Call if you experience: Fever/chills associated with headache or increased back/neck pain. Headache worsened by an upright position. New onset weakness or numbness of an extremity below the injection site Hives or difficulty breathing (go to the emergency room) Inflammation or drainage at the infection site Severe back/neck pain Any new symptoms which are concerning to you  Please note:  Although the local anesthetic injected can often make your back or neck feel good for several hours after the injection, the pain will likely return.  It takes 3-7 days for steroids to work in the epidural space.  You may not notice any pain relief for at least that one week.  If effective, we will often do a series of three injections spaced 3-6 weeks apart to maximally decrease your pain.  After the initial series, we generally will wait several months before considering a repeat injection of the same type.  If you have any questions, please call [phone] [facility] 

## 2022-08-02 NOTE — Progress Notes (Signed)
Safety precautions to be maintained throughout the outpatient stay will include: orient to surroundings, keep bed in low position, maintain call bell within reach at all times, provide assistance with transfer out of bed and ambulation.  

## 2022-08-03 ENCOUNTER — Telehealth: Payer: Self-pay

## 2022-08-03 NOTE — Telephone Encounter (Signed)
Post procedure phone call.  LM 

## 2022-09-01 ENCOUNTER — Encounter: Payer: Self-pay | Admitting: Student in an Organized Health Care Education/Training Program

## 2022-09-01 ENCOUNTER — Ambulatory Visit
Payer: Medicare HMO | Attending: Student in an Organized Health Care Education/Training Program | Admitting: Student in an Organized Health Care Education/Training Program

## 2022-09-01 VITALS — BP 116/64 | HR 65 | Temp 97.0°F | Resp 17 | Ht 66.0 in | Wt 155.0 lb

## 2022-09-01 DIAGNOSIS — G894 Chronic pain syndrome: Secondary | ICD-10-CM | POA: Insufficient documentation

## 2022-09-01 DIAGNOSIS — M47816 Spondylosis without myelopathy or radiculopathy, lumbar region: Secondary | ICD-10-CM | POA: Insufficient documentation

## 2022-09-01 DIAGNOSIS — M5416 Radiculopathy, lumbar region: Secondary | ICD-10-CM | POA: Insufficient documentation

## 2022-09-01 DIAGNOSIS — M7138 Other bursal cyst, other site: Secondary | ICD-10-CM | POA: Insufficient documentation

## 2022-09-01 MED ORDER — GABAPENTIN 300 MG PO CAPS: 300.0000 mg | ORAL_CAPSULE | Freq: Every day | ORAL | 1 refills | Status: DC

## 2022-09-01 NOTE — Progress Notes (Signed)
Safety precautions to be maintained throughout the outpatient stay will include: orient to surroundings, keep bed in low position, maintain call bell within reach at all times, provide assistance with transfer out of bed and ambulation.  

## 2022-09-01 NOTE — Progress Notes (Signed)
PROVIDER NOTE: Information contained herein reflects review and annotations entered in association with encounter. Interpretation of such information and data should be left to medically-trained personnel. Information provided to patient can be located elsewhere in the medical record under "Patient Instructions". Document created using STT-dictation technology, any transcriptional errors that may result from process are unintentional.    Patient: Lindsay Fields  Service Category: E/M  Provider: Gillis Santa, MD  DOB: [birth year]  DOS: 09/01/2022  Specialty: Interventional Pain Management  MRN: [id number]  Setting: Ambulatory outpatient  PCP: Lindsay Ruths, MD  Type: Established Patient    Referring Provider: Kirk Ruths, MD  Location: Office  Delivery: Face-to-face     HPI  [name], a 71 y.o. year old female, is here today because of her Chronic pain syndrome [G89.4]. [name] primary complain today is Back Pain (lower) Last encounter: My last encounter with her was on 08/02/22 Pertinent problems: [name] has Spondylosis without myelopathy or radiculopathy, lumbar region and Chronic pain syndrome on their pertinent problem list. Pain Assessment: Severity of Chronic pain is reported as a 6 /10. Location: Back Lower/down front, side and back of left leg to toes and sometimes around to left groin; left leg feels like a cramp; back is ache and sore. Onset: More than a month ago. Quality: Sore, Aching. Timing: Constant. Modifying factor(s): rest, tylenol, heating pad. Vitals:  height is _0  (1.676 m) and weight is 155 lb (70.3 kg). Her temporal temperature is 97 F (36.1 C) (abnormal). Her blood pressure is 116/64 and her pulse is 65. Her respiration is 17 and oxygen saturation is 99%.   Reason for encounter:PPE:  increased left LE dermatomal pain. Unfortunately previous L-ESI was not helpful. Lyrica denied by insurance. Patient would like to return back to Gabapentin. Previously  thought that left hand tremor maybe be 2/2 to Gabapentin but tremor has not improved or changed since stopping Gabapentin. We will restart Gabapentin 300 mg qhs.  Has appt with Dr Lindsay Fields for left hand tremor Dec 4th   Previous MRI was reviewed with patient which shows synovial cyst at left L5-S1.  Of note, she had hallucinations with tizanidine and has discontinued.  She also discontinued her Topamax because it was not effective.   Post-procedure evaluation   Type: Lumbar epidural steroid injection (LESI) (interlaminar) #1    Laterality: Left   Level:  L4-5 Level.  Imaging: Fluoroscopic guidance         Anesthesia: Local anesthesia (1-2% Lidocaine) DOS: 08/02/2022  Performed by: Lindsay Santa, MD  Purpose: Diagnostic/Therapeutic Indications: Lumbar radicular pain of intraspinal etiology of more than 4 weeks that has failed to respond to conservative therapy and is severe enough to impact quality of life or function. 1. Chronic pain syndrome   2. Lumbar radicular pain (LEFT L5/S1)    NAS-11 Pain score:   Pre-procedure: 6 /10   Post-procedure: 2 /10      Effectiveness:  Initial hour after procedure: 80 %  Subsequent 4-6 hours post-procedure: 80 %  Analgesia past initial 6 hours: 80 % (X 3 days)  Ongoing improvement:  Analgesic:  <20%    ROS  Constitutional: Denies any fever or chills Gastrointestinal: No reported hemesis, hematochezia, vomiting, or acute GI distress Musculoskeletal:  Left low back pain with radiation into left leg Neurological: No reported episodes of acute onset apraxia, aphasia, dysarthria, agnosia, amnesia, paralysis, loss of coordination, or loss of consciousness  Medication Review  Azelastine HCl, acetaminophen, bisoprolol, fluticasone,  gabapentin, levocetirizine, raloxifene, and rosuvastatin  History Review  Allergy: [name] has No Known Allergies. Drug: [name]  reports no history of drug use. Alcohol:  reports current alcohol use of about 3.0 - 5.0  standard drinks of alcohol per week. Tobacco:  reports that she has never smoked. She has never used smokeless tobacco. Social: [name]  reports that she has never smoked. She has never used smokeless tobacco. She reports current alcohol use of about 3.0 - 5.0 standard drinks of alcohol per week. She reports that she does not use drugs. Medical:  has a past medical history of Allergic genetic state, Arthritis, Bladder polyps, Dysrhythmia, Fibrocystic breast disease, Frequent UTI, H/O degenerative disc disease, Hypercholesterolemia, and Osteoporosis. Surgical: [name]  has a past surgical history that includes Bladder fulguration; Colonoscopy; Dilation and curettage of uterus; Abdominal hysterectomy; Ovarian cyst removal; Tonsillectomy and adenoidectomy; Colonoscopy with propofol (N/A, 07/12/2018); Colonoscopy (N/A, 03/31/2022); and Esophagogastroduodenoscopy (N/A, 03/31/2022). Family: family history includes Breast cancer (age of onset: 62) in her maternal aunt.  Laboratory Chemistry Profile   Renal No results found for: "BUN", "CREATININE", "LABCREA", "BCR", "GFR", "GFRAA", "GFRNONAA", "LABVMA", "EPIRU", "EPINEPH24HUR", "NOREPRU", "NOREPI24HUR", "DOPARU", "DOPAM24HRUR"  Hepatic No results found for: "AST", "ALT", "ALBUMIN", "ALKPHOS", "HCVAB", "AMYLASE", "LIPASE", "AMMONIA"  Electrolytes No results found for: "NA", "K", "CL", "CALCIUM", "MG", "PHOS"  Bone No results found for: "VD25OH", "VD125OH2TOT", "DG3875IE3", "PI9518AC1", "25OHVITD1", "25OHVITD2", "25OHVITD3", "TESTOFREE", "TESTOSTERONE"  Inflammation (CRP: Acute Phase) (ESR: Chronic Phase) No results found for: "CRP", "ESRSEDRATE", "LATICACIDVEN"       Note: Above Lab results reviewed.   Physical Exam  General appearance: Well nourished, well developed, and well hydrated. In no apparent acute distress Mental status: Alert, oriented x 3 (person, place, & time)       Respiratory: No evidence of acute respiratory distress Eyes:  PERLA Vitals: BP 116/64   Pulse 65   Temp (!) 97 F (36.1 C) (Temporal)   Resp 17   Ht _0  (1.676 m)   Wt 155 lb (70.3 kg)   SpO2 99%   BMI 25.02 kg/m  BMI: Estimated body mass index is 25.02 kg/m as calculated from the following:   Height as of this encounter: _1  (1.676 m).   Weight as of this encounter: 155 lb (70.3 kg). Ideal: Ideal body weight: 59.3 kg (130 lb 11.7 oz) Adjusted ideal body weight: 63.7 kg (140 lb 7 oz)  Left hand tremor and left foot tremor  Left low back pain with radiation into left leg 5 out of 5 strength bilateral lower extremity: Plantar flexion, dorsiflexion, knee flexion, knee extension.   Assessment   Diagnosis Status  1. Chronic pain syndrome   2. Lumbar radicular pain (LEFT L5/S1)   3. 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 Lumbar facet arthropathy   5. Spondylosis without myelopathy or radiculopathy, lumbar region      Persistent Persistent Persistent    Plan of Care  Restart Gabapentin at 300 mg qhs See Dr Lindsay Fields for left hand and left leg tremor   Pharmacotherapy (Medications Ordered): Meds ordered this encounter  Medications   gabapentin (NEURONTIN) 300 MG capsule    Sig: Take 1 capsule (300 mg total) by mouth at bedtime.    Dispense:  90 capsule    Refill:  1    Fill one day early if pharmacy is closed on scheduled refill date. May substitute for generic if available.   Orders:  No orders of the defined  types were placed in this encounter.  Follow-up plan:   Return in about 6 months (around 03/02/2023) for Medication Management, in person.    Recent Visits Date Type Provider Dept  08/02/22 Procedure visit Lindsay Santa, MD [facility]  07/06/22 Office Visit Lindsay Santa, MD [facility]  Showing recent visits within past 90 days and meeting all other requirements Today's Visits Date Type Provider Dept  09/01/22 Office Visit Lindsay Santa, MD [facility]  Showing today's  visits and meeting all other requirements Future Appointments No visits were found meeting these conditions. Showing future appointments within next 90 days and meeting all other requirements  I discussed the assessment and treatment plan with the patient. The patient was provided an opportunity to ask questions and all were answered. The patient agreed with the plan and demonstrated an understanding of the instructions.  Patient advised to call back or seek an in-person evaluation if the symptoms or condition worsens.  Duration of encounter: 21mnutes.  Note by: BGillis Santa MD Date: 09/01/2022; Time: 2:20 PM

## 2022-09-17 ENCOUNTER — Other Ambulatory Visit: Payer: Self-pay | Admitting: Neurology

## 2022-09-17 DIAGNOSIS — G20A1 Parkinson's disease without dyskinesia, without mention of fluctuations: Secondary | ICD-10-CM

## 2022-10-01 ENCOUNTER — Ambulatory Visit (HOSPITAL_COMMUNITY)
Admission: RE | Admit: 2022-10-01 | Discharge: 2022-10-01 | Disposition: A | Discharge status: Home / Self Care | Payer: Medicare HMO | Source: Ambulatory Visit | Attending: Neurology | Admitting: Neurology

## 2022-10-01 DIAGNOSIS — G20A1 Parkinson's disease without dyskinesia, without mention of fluctuations: Secondary | ICD-10-CM | POA: Insufficient documentation

## 2022-12-14 DIAGNOSIS — G20A1 Parkinson's disease without dyskinesia, without mention of fluctuations: Secondary | ICD-10-CM | POA: Insufficient documentation

## 2022-12-17 NOTE — Progress Notes (Unsigned)
Referring Physician:  Kirk Ruths, MD [facility] [place],  [place] [address]  Primary Physician:  Kirk Ruths, MD  History of Present Illness: 12/17/2022*** [name] has a history of parkinsons, hyperlipidemia, osteoporosis, CKD, chronic pain, and prediabetes.   She sees neurology for her parkinsons. She sees pain management (Late[facility]). She had peripheral nerve stimulator implant done on 10/19/21 with Dr. Holley Raring.       Care with NSAIDs due to CKD.    Duration: *** Location: *** Quality: *** Severity: ***  Precipitating: aggravated by *** Modifying factors: made better by *** Weakness: none Timing: *** Bowel/Bladder Dysfunction: none  Conservative measures:  Physical therapy: ***  Multimodal medical therapy including regular antiinflammatories: tylenol, robaxin, neurontin, zanaflex (hallucinations) Injections:  08/02/22 LEFT L5-S1 IL ESI ([facility])  07/22/2021: Left SI joint injection (no relief) 06/24/2021: Bilateral S1 transforaminal ESI (no relief) 06/10/2021: LEFT S1 trigger point injection (no relief) 04/08/2021: RFA to the bilateral L4-5 and L5-S1 facet joints (no relief)  03/02/2021: MBB to the bilateral L4-5 and L5-S1 facet joints (8/10 to 1/10) 02/13/2021: MBB to the bilateral L4-5 and L5-S1 facet joints (8/10 to 1/10) 06/23/2020: Bilateral L4 and bilateral L5 trigger point injections (3 days relief)   Past Surgery: ***  Lindsay Fields has ***no symptoms of cervical myelopathy.  The symptoms are causing a significant impact on the patient's life.   Review of Systems:  A 10 point review of systems is negative, except for the pertinent positives and negatives detailed in the HPI.  Past Medical History: Past Medical History:  Diagnosis Date   Allergic genetic state    Arthritis    Bladder polyps    Dysrhythmia    PVC's   Fibrocystic breast disease    Frequent UTI    H/O degenerative disc disease     Hypercholesterolemia    Osteoporosis     Past Surgical History: Past Surgical History:  Procedure Laterality Date   ABDOMINAL HYSTERECTOMY     BLADDER FULGURATION     COLONOSCOPY     COLONOSCOPY N/A 03/31/2022   Procedure: COLONOSCOPY;  Surgeon: Toledo, [name], MD;  Location: [facility];  Service: Gastroenterology;  Laterality: N/A;   COLONOSCOPY WITH PROPOFOL N/A 07/12/2018   Procedure: COLONOSCOPY WITH PROPOFOL;  Surgeon: Toledo, [name], MD;  Location: [facility];  Service: Gastroenterology;  Laterality: N/A;   DILATION AND CURETTAGE OF UTERUS     ESOPHAGOGASTRODUODENOSCOPY N/A 03/31/2022   Procedure: ESOPHAGOGASTRODUODENOSCOPY (EGD);  Surgeon: Toledo, [name], MD;  Location: [facility] ENDOSCOPY;  Service: Gastroenterology;  Laterality: N/A;   OVARIAN CYST REMOVAL     TONSILLECTOMY AND ADENOIDECTOMY      Allergies: Allergies as of 12/22/2022   (No Known Allergies)    Medications: Outpatient Encounter Medications as of 12/22/2022  Medication Sig   acetaminophen (TYLENOL) 500 MG tablet Take 1,000 mg by mouth every 6 (six) hours as needed.   Azelastine HCl 137 MCG/SPRAY SOLN Place 137 mcg into the nose.   bisoprolol (ZEBETA) 5 MG tablet Take 2.5 mg by mouth daily.   fluticasone (FLONASE) 50 MCG/ACT nasal spray Place 2 sprays into both nostrils 2 (two) times daily as needed for allergies or rhinitis.   gabapentin (NEURONTIN) 300 MG capsule Take 1 capsule (300 mg total) by mouth at bedtime.   levocetirizine (XYZAL) 5 MG tablet Take 5 mg by mouth every evening.   raloxifene (EVISTA) 60 MG tablet Take 60 mg by mouth daily.  rosuvastatin (CRESTOR) 10 MG tablet Take 10 mg by mouth daily.   No facility-administered encounter medications on file as of 12/22/2022.    Social History: Social History   Tobacco Use   Smoking status: Never   Smokeless tobacco: Never  Vaping Use   Vaping Use: Never used  Substance Use Topics   Alcohol use: Yes    Alcohol/week: 3.0 - 5.0  standard drinks of alcohol    Types: 3 - 5 Glasses of wine per week   Drug use: Never    Family Medical History: Family History  Problem Relation Age of Onset   Breast cancer Maternal Aunt 70    Physical Examination: There were no vitals filed for this visit.  General: Patient is well developed, well nourished, calm, collected, and in no apparent distress. Attention to examination is appropriate.  Respiratory: Patient is breathing without any difficulty.   NEUROLOGICAL:     Awake, alert, oriented to person, place, and time.  Speech is clear and fluent. Fund of knowledge is appropriate.   Cranial Nerves: Pupils equal round and reactive to light.  Facial tone is symmetric.    *** ROM of cervical spine *** pain *** posterior cervical tenderness. *** tenderness in bilateral trapezial region.   *** ROM of lumbar spine *** pain *** posterior lumbar tenderness.   No abnormal lesions on exposed skin.   Strength: Side Biceps Triceps Deltoid Interossei Grip Wrist Ext. Wrist Flex.  R '5 5 5 5 5 5 5  '$ L '5 5 5 5 5 5 5   '$ Side Iliopsoas Quads Hamstring PF DF EHL  R '5 5 5 5 5 5  '$ L '5 5 5 5 5 5   '$ Reflexes are ***2+ and symmetric at the biceps, triceps, brachioradialis, patella and achilles.   Hoffman's is absent.  Clonus is not present.   Bilateral upper and lower extremity sensation is intact to light touch.     Gait is normal.   ***No difficulty with tandem gait.    Medical Decision Making  Imaging: MRI lumbar spine dated 08/26/21:  FINDINGS: Segmentation:  Standard.   Alignment:  Physiologic.   Vertebrae: No fracture, evidence of discitis, or bone lesion. Mild reactive marrow edema associated with the left L5-S1 facet joint. Trace bilateral L5-S1 facet joint effusions.   Conus medullaris and cauda equina: Conus extends to the L1-2 level. Conus and cauda equina appear normal.   Paraspinal and other soft tissues: Negative.   Disc levels:   T12-L1: Unremarkable.    L1-L2: Disc desiccation with shallow right paracentral disc bulge. No foraminal or canal stenosis. Unchanged.   L2-L3: Unremarkable.   L3-L4: Disc desiccation with mild diffuse disc bulge. No foraminal or canal stenosis. Unchanged.   L4-L5: Disc desiccation with mild diffuse disc bulge. No foraminal or canal stenosis. Unchanged.   L5-S1: Mild disc bulge. Moderate bilateral facet arthropathy. New 10 x 8 mm subligamentous facet synovial cyst along the anteromedial aspect of the left L5-S1 facet joint with slight mass effect upon the thecal sac, although no resultant canal or subarticular recess stenosis. Bilateral foramina remain patent.   IMPRESSION: 1. Moderate bilateral facet arthropathy at L5-S1 with new 10 x 8 mm subligamentous facet synovial cyst along the anteromedial aspect of the left L5-S1 facet joint with slight mass effect upon the thecal sac, although no resultant canal or subarticular recess stenosis. 2. Mild reactive marrow edema associated with the left L5-S1 facet joint, which can be a focal source of back pain. 3.  No significant foraminal or canal stenosis at any level.     Electronically Signed   By: Davina Poke D.O.   On: 08/26/2021 15:08  I have personally reviewed the images and agree with the above interpretation.  Assessment and Plan: Ms. Groven is a pleasant 72 y.o. female has ***  Treatment options discussed with patient and following plan made:   - Order for physical therapy for *** spine ***. Patient to call to schedule appointment. *** - Continue current medications including ***. Reviewed dosing and side effects.  - Prescription for ***. Reviewed dosing and side effects. Take with food.  - Prescription for *** to take prn muscle spasms. Reviewed dosing and side effects. Discussed this can cause drowsiness.  - MRI of *** to further evaluate *** radiculopathy. No improvement time or medications (***).  - Referral to PMR at [facility] to discuss  possible *** injections.  - Will schedule phone visit to review MRI results once I get them back.   I spent a total of *** minutes in face-to-face and non-face-to-face activities related to this patient's care today including review of outside records, review of imaging, review of symptoms, physical exam, discussion of differential diagnosis, discussion of treatment options, and documentation.   Thank you for involving me in the care of this patient.   Geronimo Boot PA-C Dept. of Neurosurgery

## 2022-12-22 ENCOUNTER — Encounter: Payer: Self-pay | Admitting: Orthopedic Surgery

## 2022-12-22 ENCOUNTER — Ambulatory Visit: Payer: Medicare HMO | Admitting: Orthopedic Surgery

## 2022-12-22 VITALS — BP 100/60 | HR 68 | Ht 66.0 in | Wt 150.0 lb

## 2022-12-22 DIAGNOSIS — M5416 Radiculopathy, lumbar region: Secondary | ICD-10-CM | POA: Diagnosis not present

## 2022-12-22 DIAGNOSIS — M47816 Spondylosis without myelopathy or radiculopathy, lumbar region: Secondary | ICD-10-CM | POA: Diagnosis not present

## 2023-01-01 ENCOUNTER — Ambulatory Visit
Admission: RE | Admit: 2023-01-01 | Discharge: 2023-01-01 | Disposition: A | Discharge status: Home / Self Care | Payer: Medicare HMO | Source: Ambulatory Visit | Attending: Orthopedic Surgery | Admitting: Orthopedic Surgery

## 2023-01-01 DIAGNOSIS — M5416 Radiculopathy, lumbar region: Secondary | ICD-10-CM | POA: Insufficient documentation

## 2023-01-01 DIAGNOSIS — M47816 Spondylosis without myelopathy or radiculopathy, lumbar region: Secondary | ICD-10-CM | POA: Insufficient documentation

## 2023-01-05 ENCOUNTER — Encounter: Payer: Self-pay | Admitting: Orthopedic Surgery

## 2023-01-05 NOTE — Progress Notes (Signed)
Lumbar MRI dated 01/01/23:  FINDINGS: Segmentation:  Standard.   Alignment:  Mild degenerative anterolisthesis at L5-S1   Vertebrae: No fracture, evidence of discitis, or bone lesion. Degenerative marrow edema at the left more than right facets of L5-S1.   Conus medullaris and cauda equina: Conus extends to the L2 level. Conus and cauda equina appear normal.   Paraspinal and other soft tissues: Negative for perispinal mass or inflammation.   Disc levels:   T12- L1: Unremarkable.   L1-L2: Disc desiccation and narrowing with mild bulge   L2-L3: Unremarkable.   L3-L4: Disc height loss and bulging.   L4-L5: Mild disc height loss and facet spurring. No neural compression   L5-S1:Advanced facet degeneration with spurring and anterolisthesis. The disc is narrowed and bulging with mild endplate degeneration.   IMPRESSION: Generalized lumbar spine degeneration greatest at L5-S1 where there is anterolisthesis and degenerative edema. No neural compression.     Electronically Signed   By: Jorje Guild M.D.   On: 01/05/2023 05:12  I have personally reviewed the images and agree with the above interpretation.  Above MRI reviewed with Dr. Izora Ribas. She is likely a candidate for fusion TLIF at L5-S1. Will get flexion/extension lumbar xrays and have her f/u with him to discuss.

## 2023-01-07 NOTE — Progress Notes (Signed)
Referring Physician:  [name], MD [address] [facility] [place],  [place] [address]  Primary Physician:  [name], MD  History of Present Illness: 01/07/2023 Lindsay Fields has a history of parkinsons, hyperlipidemia, osteoporosis, CKD, chronic pain, and prediabetes.   She sees neurology for her parkinsons. She sees pain management (Lateef). She had peripheral nerve stimulator implant done on 10/19/21 with [name] (trial), but it did not really help much. She did not get permanent implant.    Last seen by me on 12/22/22 for 2 year history of constant LBP with intermittent left lateral leg pain to her knee. No right leg pain. She notes weakness in left leg.   Previous MRI showed lumbar spondylosis with facet hypertrophy and left sided facet cyst at L5-S1.   MRI of lumbar spine was ordered and she is here to review it.   Her symptoms are the same. She has constant LBP with intermittent left lateral leg pain to her knee. She also has known left hip OA. No right leg pain. She notes weakness in left leg. Pain is worse with walking and stairs.   She did 6 weeks of PT and was discharged in February of 2024- she saw some relief. Injections as above. No lumbar surgery.   Care with NSAIDs due to CKD.   Bowel/Bladder Dysfunction: none  Conservative measures:  Physical therapy: did 6 weeks (strength and balance) and was discharged February 2024. This was at [facility] PT on [place].  Multimodal medical therapy including regular antiinflammatories: tylenol, robaxin, neurontin, zanaflex (hallucinations) Injections:  08/02/22 LEFT L5-S1 IL ESI (Lateef) 07/22/2021: Left SI joint injection (no relief) 06/24/2021: Bilateral S1 transforaminal ESI (no relief) 06/10/2021: LEFT S1 trigger point injection (no relief) 04/08/2021: RFA to the bilateral L4-5 and L5-S1 facet joints (no relief)  03/02/2021: MBB to the bilateral L4-5 and L5-S1 facet joints  (8/10 to 1/10) 02/13/2021: MBB to the bilateral L4-5 and L5-S1 facet joints (8/10 to 1/10) 06/23/2020: Bilateral L4 and bilateral L5 trigger point injections (3 days relief)   Past Surgery:  PNS trial with Lateef 2023  Lindsay Fields has no symptoms of cervical myelopathy.  The symptoms are causing a significant impact on the patient's life.   Review of Systems:  A 10 point review of systems is negative, except for the pertinent positives and negatives detailed in the HPI.  Past Medical History: Past Medical History:  Diagnosis Date   Allergic genetic state    Arthritis    Bladder polyps    Dysrhythmia    PVC's   Fibrocystic breast disease    Frequent UTI    H/O degenerative disc disease    Hypercholesterolemia    Osteoporosis     Past Surgical History: Past Surgical History:  Procedure Laterality Date   ABDOMINAL HYSTERECTOMY     BLADDER FULGURATION     COLONOSCOPY     COLONOSCOPY N/A 03/31/2022   Procedure: COLONOSCOPY;  Surgeon: Toledo, Boykin Nearing, MD;  Location: [facility] ENDOSCOPY;  Service: Gastroenterology;  Laterality: N/A;   COLONOSCOPY WITH PROPOFOL N/A 07/12/2018   Procedure: COLONOSCOPY WITH PROPOFOL;  Surgeon: Toledo, Boykin Nearing, MD;  Location: [facility] ENDOSCOPY;  Service: Gastroenterology;  Laterality: N/A;   DILATION AND CURETTAGE OF UTERUS     ESOPHAGOGASTRODUODENOSCOPY N/A 03/31/2022   Procedure: ESOPHAGOGASTRODUODENOSCOPY (EGD);  Surgeon: Toledo, Boykin Nearing, MD;  Location: [facility] ENDOSCOPY;  Service: Gastroenterology;  Laterality: N/A;   OVARIAN CYST REMOVAL     TONSILLECTOMY  AND ADENOIDECTOMY      Allergies: Allergies as of 01/18/2023   (No Known Allergies)    Medications: Outpatient Encounter Medications as of 01/18/2023  Medication Sig   acetaminophen (TYLENOL) 500 MG tablet Take 1,000 mg by mouth every 6 (six) hours as needed.   Azelastine HCl 137 MCG/SPRAY SOLN Place 137 mcg into the nose.   bisoprolol (ZEBETA) 5 MG tablet Take 2.5 mg by mouth daily.    calcium carbonate (OS-CAL - DOSED IN MG OF ELEMENTAL CALCIUM) 1250 (500 Ca) MG tablet Take 1 tablet by mouth daily with breakfast. Vitamin d 750 mg   carbidopa-levodopa (PARCOPA) 25-100 MG disintegrating tablet Take 1 tablet by mouth 3 (three) times daily.   fluticasone (FLONASE) 50 MCG/ACT nasal spray Place 2 sprays into both nostrils 2 (two) times daily as needed for allergies or rhinitis.   gabapentin (NEURONTIN) 300 MG capsule Take 1 capsule (300 mg total) by mouth at bedtime.   levocetirizine (XYZAL) 5 MG tablet Take 5 mg by mouth every evening.   magnesium 30 MG tablet Take 500 mg by mouth 2 (two) times daily.   raloxifene (EVISTA) 60 MG tablet Take 60 mg by mouth daily.   rosuvastatin (CRESTOR) 10 MG tablet Take 10 mg by mouth daily.   No facility-administered encounter medications on file as of 01/18/2023.    Social History: Social History   Tobacco Use   Smoking status: Never   Smokeless tobacco: Never  Vaping Use   Vaping Use: Never used  Substance Use Topics   Alcohol use: Yes    Alcohol/week: 3.0 - 5.0 standard drinks of alcohol    Types: 3 - 5 Glasses of wine per week   Drug use: Never    Family Medical History: Family History  Problem Relation Age of Onset   Breast cancer Maternal Aunt 45    Physical Examination: There were no vitals filed for this visit.    Awake, alert, oriented to person, place, and time.  Speech is clear and fluent. Fund of knowledge is appropriate.   Cranial Nerves: Pupils equal round and reactive to light.  Facial tone is symmetric.    She has posterior lumbar tenderness centrally.  No abnormal lesions on exposed skin.   Strength:  Side Iliopsoas Quads Hamstring PF DF EHL  R 5 5 5 5 5 5   L 5 5 5 5 5 5    Clonus is not present.   Bilateral lower extremity sensation is intact to light touch.     Gait is normal.    Medical Decision Making  Imaging: Lumbar MRI dated 01/01/23:  FINDINGS: Segmentation:  Standard.   Alignment:   Mild degenerative anterolisthesis at L5-S1   Vertebrae: No fracture, evidence of discitis, or bone lesion. Degenerative marrow edema at the left more than right facets of L5-S1.   Conus medullaris and cauda equina: Conus extends to the L2 level. Conus and cauda equina appear normal.   Paraspinal and other soft tissues: Negative for perispinal mass or inflammation.   Disc levels:   T12- L1: Unremarkable.   L1-L2: Disc desiccation and narrowing with mild bulge   L2-L3: Unremarkable.   L3-L4: Disc height loss and bulging.   L4-L5: Mild disc height loss and facet spurring. No neural compression   L5-S1:Advanced facet degeneration with spurring and anterolisthesis. The disc is narrowed and bulging with mild endplate degeneration.   IMPRESSION: Generalized lumbar spine degeneration greatest at L5-S1 where there is anterolisthesis and degenerative edema. No neural compression.  Electronically Signed   By: Tiburcio Pea M.D.   On: 01/05/2023 05:12  I have personally reviewed the images and agree with the above interpretation.  Assessment and Plan: Lindsay Fields is a pleasant 72 y.o. female has  2 year history of constant LBP with intermittent left lateral leg pain to her knee. No right leg pain. She notes weakness in left leg.  She has known slip at L5-S1 with facet hypertrophy.   No improvement with PT, injections, or peripheral nerve stim trial.   Treatment options discussed with patient and following plan made:   - MRI reviewed with Dr. Myer Haff prior to her visit. She is likely a candidate for L5-S1 fusion with TLIF. She is interested in discussing this.  - Lumbar xrays ordered with flexion/extension. She will get done prior to her follow up. - Will get PT notes from [facility] PT on [place]. ROI signed.  - Follow up scheduled to see Dr. Myer Haff to discuss surgery options.   I spent a total of 20 minutes in face-to-face and non-face-to-face activities related to  this patient's care today including review of outside records, review of imaging, review of symptoms, physical exam, discussion of differential diagnosis, discussion of treatment options, and documentation.   Drake Leach PA-C Dept. of Neurosurgery

## 2023-01-18 ENCOUNTER — Ambulatory Visit: Payer: Medicare HMO | Admitting: Orthopedic Surgery

## 2023-01-18 ENCOUNTER — Encounter: Payer: Self-pay | Admitting: Orthopedic Surgery

## 2023-01-18 VITALS — BP 115/59 | HR 65 | Ht 66.0 in | Wt 146.0 lb

## 2023-01-18 DIAGNOSIS — M4316 Spondylolisthesis, lumbar region: Secondary | ICD-10-CM | POA: Diagnosis not present

## 2023-01-18 DIAGNOSIS — M47816 Spondylosis without myelopathy or radiculopathy, lumbar region: Secondary | ICD-10-CM

## 2023-01-18 DIAGNOSIS — M4726 Other spondylosis with radiculopathy, lumbar region: Secondary | ICD-10-CM

## 2023-01-18 DIAGNOSIS — M5416 Radiculopathy, lumbar region: Secondary | ICD-10-CM

## 2023-01-18 NOTE — Patient Instructions (Signed)
It was so nice to see you today. Thank you so much for coming in.    I ordered xrays of your lower back. You can get these at [facility] (building with the white pillars) off of [address]. The address is [address], [place], [place] [address]. You do not need any appointment.   I want you to follow up with Dr. Myer Haff to discuss surgery (fusion at L5-S1). Get the above xrays prior to this visit.   Please do not hesitate to call if you have any questions or concerns. You can also message me in MyChart.    Drake Leach PA-C [phone]

## 2023-01-25 ENCOUNTER — Ambulatory Visit
Admission: RE | Admit: 2023-01-25 | Discharge: 2023-01-25 | Disposition: A | Discharge status: Home / Self Care | Payer: Medicare HMO | Source: Ambulatory Visit | Attending: Orthopedic Surgery | Admitting: Orthopedic Surgery

## 2023-01-25 ENCOUNTER — Ambulatory Visit
Admission: RE | Admit: 2023-01-25 | Discharge: 2023-01-25 | Disposition: A | Discharge status: Home / Self Care | Payer: Medicare HMO | Attending: Orthopedic Surgery | Admitting: Orthopedic Surgery

## 2023-01-25 DIAGNOSIS — M47816 Spondylosis without myelopathy or radiculopathy, lumbar region: Secondary | ICD-10-CM

## 2023-01-25 DIAGNOSIS — M5416 Radiculopathy, lumbar region: Secondary | ICD-10-CM | POA: Diagnosis present

## 2023-01-25 DIAGNOSIS — M4316 Spondylolisthesis, lumbar region: Secondary | ICD-10-CM | POA: Diagnosis present

## 2023-02-08 ENCOUNTER — Encounter: Payer: Self-pay | Admitting: Neurosurgery

## 2023-02-08 ENCOUNTER — Ambulatory Visit: Payer: Medicare HMO | Admitting: Neurosurgery

## 2023-02-08 VITALS — BP 122/80 | Ht 66.0 in | Wt 145.0 lb

## 2023-02-08 DIAGNOSIS — M810 Age-related osteoporosis without current pathological fracture: Secondary | ICD-10-CM | POA: Diagnosis not present

## 2023-02-08 DIAGNOSIS — G8929 Other chronic pain: Secondary | ICD-10-CM

## 2023-02-08 DIAGNOSIS — M5442 Lumbago with sciatica, left side: Secondary | ICD-10-CM

## 2023-02-08 DIAGNOSIS — M4316 Spondylolisthesis, lumbar region: Secondary | ICD-10-CM

## 2023-02-08 NOTE — Patient Instructions (Signed)
Bone density scan: call [phone] to schedule   Please see below for information in regards to your upcoming surgery:   Planned surgery: L5-S1 minimally invasive (MIS) transforaminal lumbar interbody fusion (TLIF)   Surgery date: 03/09/23 - you will find out your arrival time the business day before your surgery.   Pre-op appointment at [facility] Pre-admit Testing: we will call you with a date/time for this. Pre-admit testing is located on the first floor of the [facility] building, [address] [facility] [address]. Please bring all prescriptions in the original prescription bottles to your appointment, even if you have reviewed medications by phone with a pharmacy representative. Pre-op labs may be done at your pre-op appointment. You are not required to fast for these labs. Should you need to change your pre-op appointment, please call Pre-admit testing at [phone].    Surgical clearance: we will send a clearance form to Dr Dareen Piano     Brace: [facility] will contact you regarding an appointment for the brace you will use after surgery. Their number is [phone] should you miss their call or have an issue with your brace after surgery. You will need to bring the brace to the hospital on the day of surgery.    NSAIDS (Non-steroidal anti-inflammatory drugs): because you are having a fusion, no NSAIDS (such as ibuprofen, aleve, naproxen, meloxicam, diclofenac) for 3 months after surgery. Celebrex is an exception. Tylenol is ok because it is not an NSAID.    Home health physical therapy: [facility] will contact you regarding home health physical therapy for after surgery.Their number is [phone].    Because you are having a fusion: for appointments after your 2 week follow-up: please arrive at the [facility] outpatient imaging center ([address], [place]) or [facility] one hour prior to  your appointment for x-rays. This applies to every appointment after your 2 week follow-up. Failure to do so may result in your appointment being rescheduled.    We can be reached by phone or mychart 8am-4pm, Monday-Friday. If you have any questions/concerns before or after surgery, you can reach [place] at [phone], or you can send a mychart message. If you have a concern after hours that cannot wait until normal business hours, you can call [phone] and ask to page the neurosurgeon on call for [place].     Appointments/FMLA & disability paperwork: [employer]  Nurse: Royston Cowper  Medical assistants: Laurann Montana Physician Assistant's: Manning Charity & Drake Leach Surgeon: Venetia Night, MD

## 2023-02-08 NOTE — Progress Notes (Signed)
Referring Physician:  [name], MD [address] [facility] [place],  [place] [address]  Primary Physician:  [name], MD  History of Present Illness: 02/08/2023 [name] is here today with a chief complaint of low back pain that intermittently radiates into the left leg and stops at the knee. No pain in her right leg.   She has been having worsening pain over the past several years.  Standing makes her pain worse.  She is able to walk but has significant lower back discomfort.  She has some pain down her leg, but her back pain is more prominent.  Bowel/Bladder Dysfunction: none  Conservative measures:  Physical therapy: did 6 weeks (strength and balance) and was discharged February 2024. This was at [facility] PT on [place].   Multimodal medical therapy including regular antiinflammatories: tylenol, robaxin, neurontin, zanaflex   Injections: has received epidural steroid injections 08/02/22 LEFT L5-S1 IL ESI (Lateef) 07/22/2021: Left SI joint injection (no relief) 06/24/2021: Bilateral S1 transforaminal ESI (no relief) 06/10/2021: LEFT S1 trigger point injection (no relief) 04/08/2021: RFA to the bilateral L4-5 and L5-S1 facet joints (no relief)  03/02/2021: MBB to the bilateral L4-5 and L5-S1 facet joints (8/10 to 1/10) 02/13/2021: MBB to the bilateral L4-5 and L5-S1 facet joints (8/10 to 1/10) 06/23/2020: Bilateral L4 and bilateral L5 trigger point injections (3 days relief)   Past Surgery:  PNS trial with Lateef 2023   Lindsay Fields has no symptoms of cervical myelopathy.  The symptoms are causing a significant impact on the patient's life.    Progress Note from Drake Leach, [name] on 01/18/23:   History of Present Illness: 01/07/2023 [name] has a history of parkinsons, hyperlipidemia, osteoporosis, CKD, chronic pain, and prediabetes.    She sees neurology for her parkinsons. She sees pain management (Lateef). She had  peripheral nerve stimulator implant done on 10/19/21 with [name] (trial), but it did not really help much. She did not get permanent implant.     Last seen by me on 12/22/22 for 2 year history of constant LBP with intermittent left lateral leg pain to her knee. No right leg pain. She notes weakness in left leg.   Previous MRI showed lumbar spondylosis with facet hypertrophy and left sided facet cyst at L5-S1.    MRI of lumbar spine was ordered and she is here to review it.    Her symptoms are the same. She has constant LBP with intermittent left lateral leg pain to her knee. She also has known left hip OA. No right leg pain. She notes weakness in left leg. Pain is worse with walking and stairs.    She did 6 weeks of PT and was discharged in February of 2024- she saw some relief. Injections as above. No lumbar surgery.    Care with NSAIDs due to CKD.   I have utilized the care everywhere function in epic to review the outside records available from external health systems.  Review of Systems:  A 10 point review of systems is negative, except for the pertinent positives and negatives detailed in the HPI.  Past Medical History: Past Medical History:  Diagnosis Date   Allergic genetic state    Arthritis    Bladder polyps    Dysrhythmia    PVC's   Fibrocystic breast disease    Frequent UTI    H/O degenerative disc disease    Hypercholesterolemia    Osteoporosis  Past Surgical History: Past Surgical History:  Procedure Laterality Date   ABDOMINAL HYSTERECTOMY     BLADDER FULGURATION     COLONOSCOPY     COLONOSCOPY N/A 03/31/2022   Procedure: COLONOSCOPY;  Surgeon: Toledo, Boykin Nearing, MD;  Location: [facility] ENDOSCOPY;  Service: Gastroenterology;  Laterality: N/A;   COLONOSCOPY WITH PROPOFOL N/A 07/12/2018   Procedure: COLONOSCOPY WITH PROPOFOL;  Surgeon: Toledo, Boykin Nearing, MD;  Location: [facility] ENDOSCOPY;  Service: Gastroenterology;  Laterality: N/A;   DILATION AND CURETTAGE OF  UTERUS     ESOPHAGOGASTRODUODENOSCOPY N/A 03/31/2022   Procedure: ESOPHAGOGASTRODUODENOSCOPY (EGD);  Surgeon: Toledo, Boykin Nearing, MD;  Location: [facility] ENDOSCOPY;  Service: Gastroenterology;  Laterality: N/A;   OVARIAN CYST REMOVAL     TONSILLECTOMY AND ADENOIDECTOMY      Allergies: Allergies as of 02/08/2023   (No Known Allergies)    Medications:  Current Outpatient Medications:    acetaminophen (TYLENOL) 500 MG tablet, Take 1,000 mg by mouth every 6 (six) hours as needed., Disp: , Rfl:    Azelastine HCl 137 MCG/SPRAY SOLN, Place 137 mcg into the nose., Disp: , Rfl:    bisoprolol (ZEBETA) 5 MG tablet, Take 2.5 mg by mouth daily., Disp: , Rfl:    CALCIUM-VITAMIN D PO, Take by mouth daily., Disp: , Rfl:    carbidopa-levodopa (PARCOPA) 25-100 MG disintegrating tablet, Take 1 tablet by mouth 3 (three) times daily., Disp: , Rfl:    fluticasone (FLONASE) 50 MCG/ACT nasal spray, Place 2 sprays into both nostrils 2 (two) times daily as needed for allergies or rhinitis., Disp: , Rfl:    gabapentin (NEURONTIN) 300 MG capsule, Take 1 capsule (300 mg total) by mouth at bedtime., Disp: 90 capsule, Rfl: 1   levocetirizine (XYZAL) 5 MG tablet, Take 5 mg by mouth every evening., Disp: , Rfl:    magnesium 30 MG tablet, Take 500 mg by mouth daily., Disp: , Rfl:    raloxifene (EVISTA) 60 MG tablet, Take 60 mg by mouth daily., Disp: , Rfl:    rosuvastatin (CRESTOR) 5 MG tablet, Take 1 tablet by mouth daily., Disp: , Rfl:   Social History: Social History   Tobacco Use   Smoking status: Never   Smokeless tobacco: Never  Vaping Use   Vaping Use: Never used  Substance Use Topics   Alcohol use: Yes    Alcohol/week: 3.0 - 5.0 standard drinks of alcohol    Types: 3 - 5 Glasses of wine per week   Drug use: Never    Family Medical History: Family History  Problem Relation Age of Onset   Breast cancer Maternal Aunt 3    Physical Examination: Vitals:   02/08/23 1109  BP: 122/80     General: Patient is in no apparent distress. Attention to examination is appropriate.  Neck:   Supple.  Full range of motion.  Respiratory: Patient is breathing without any difficulty.   NEUROLOGICAL:     Awake, alert, oriented to person, place, and time.  Speech is clear and fluent.   Cranial Nerves: Pupils equal round and reactive to light.  Facial tone is symmetric.  Facial sensation is symmetric. Shoulder shrug is symmetric. Tongue protrusion is midline.  There is no pronator drift.  ROM of spine: full.    Strength: Side Biceps Triceps Deltoid Interossei Grip Wrist Ext. Wrist Flex.  R 5 5 5 5 5 5 5   L 5 5 5 5 5 5 5    Side Iliopsoas Quads Hamstring PF DF EHL  R 5 5  5 5 5 5   L 5 5 5 5 5 5    Reflexes are 1+ and symmetric at the biceps, triceps, brachioradialis, patella and achilles.   Hoffman's is absent.   Bilateral upper and lower extremity sensation is intact to light touch.    No evidence of dysmetria noted.  Gait is slowed and antalgic. She has some pain with L leg extension     Medical Decision Making  Imaging: L spine Flex Ext 01/25/2023 FINDINGS: No compression deformities. No osteolytic or osteoblastic changes. Grade 1 L5 anterolisthesis with respect of the sacrum. There is no motion observed with flexion and extension to suggest instability. Facet joint sclerosis and osteophyte formation L4-5 and L5-S1.   IMPRESSION: Lumbosacral mild spondylolisthesis and facet joint degenerative changes.     Electronically Signed   By: Layla Maw M.D.   On: 01/29/2023 11:57  MR Lumbar Spine 01/25/2023 Disc levels:   T12- L1: Unremarkable.   L1-L2: Disc desiccation and narrowing with mild bulge   L2-L3: Unremarkable.   L3-L4: Disc height loss and bulging.   L4-L5: Mild disc height loss and facet spurring. No neural compression   L5-S1:Advanced facet degeneration with spurring and anterolisthesis. The disc is narrowed and bulging with mild endplate  degeneration.   IMPRESSION: Generalized lumbar spine degeneration greatest at L5-S1 where there is anterolisthesis and degenerative edema. No neural compression.     Electronically Signed   By: Tiburcio Pea M.D.   On: 01/05/2023 05:12    I have personally reviewed the images and agree with the above interpretation.  I have the following additions to the read.  She has approximately 3 mm anterolisthesis on her supine MRI scan and on her extension films.  The anterolisthesis extends to 6 mm on flexion.  Please note that she had an MRI scan performed in November 2022.  There is no anterolisthesis at L5-S1 seen on that study.  Assessment and Plan: Ms. Bossard is a pleasant 72 y.o. female with spondylolisthesis at L5-S1 with approximately 3 mm on extension and 6 mm on flexion.  Is also present on supine MRI scan.  This anterolisthesis was not present on her MRI scan from 1-1/2 years ago.  Thus, she has an implied spinal instability that is likely degenerative in nature.  She is spondylolisthesis of L5 on S1.  She has low back pain with some radicular symptoms consistent with sciatica on the left side.  She has tried extensive conservative management without improvement.  At this point, I feel that she has implied instability due to the worsening of her anterolisthesis at L5-S1 which was not present a year and a half ago.  I think it is reasonable to consider stabilization of the L5-S1 level with transforaminal lumbar interbody fusion.  She has known osteoporosis.  I recommended repeating her DEXA study as she last had it performed 4 years ago.  If she continues to have a T-score of -2.5 or -2.8 as was on that prior study, I would consider anabolic treatment for 3 to 6 months before consideration of the surgery.  I discussed the planned procedure at length with the patient, including the risks, benefits, alternatives, and indications. The risks discussed include but are not limited to bleeding,  infection, need for reoperation, spinal fluid leak, stroke, vision loss, anesthetic complication, coma, paralysis, and even death. I also described in detail that improvement was not guaranteed.  The patient expressed understanding of these risks, and asked that we proceed with surgery. I described  the surgery in layman's terms, and gave ample opportunity for questions, which were answered to the best of my ability.  I spent a total of 30 minutes in this patient's care today. This time was spent reviewing pertinent records including imaging studies, obtaining and confirming history, performing a directed evaluation, formulating and discussing my recommendations, and documenting the visit within the medical record.      Thank you for involving me in the care of this patient.      Jonie Burdell K. Myer Haff MD, [facility] Neurosurgery

## 2023-02-10 ENCOUNTER — Ambulatory Visit
Admission: RE | Admit: 2023-02-10 | Discharge: 2023-02-10 | Disposition: A | Discharge status: Home / Self Care | Payer: Medicare HMO | Source: Ambulatory Visit | Attending: Neurosurgery | Admitting: Neurosurgery

## 2023-02-10 DIAGNOSIS — M810 Age-related osteoporosis without current pathological fracture: Secondary | ICD-10-CM | POA: Diagnosis present

## 2023-02-24 ENCOUNTER — Ambulatory Visit
Payer: Medicare HMO | Attending: Student in an Organized Health Care Education/Training Program | Admitting: Student in an Organized Health Care Education/Training Program

## 2023-02-24 ENCOUNTER — Encounter: Payer: Self-pay | Admitting: Student in an Organized Health Care Education/Training Program

## 2023-02-24 DIAGNOSIS — M47816 Spondylosis without myelopathy or radiculopathy, lumbar region: Secondary | ICD-10-CM | POA: Diagnosis not present

## 2023-02-24 DIAGNOSIS — M5416 Radiculopathy, lumbar region: Secondary | ICD-10-CM | POA: Diagnosis not present

## 2023-02-24 DIAGNOSIS — G894 Chronic pain syndrome: Secondary | ICD-10-CM

## 2023-02-24 DIAGNOSIS — M7138 Other bursal cyst, other site: Secondary | ICD-10-CM | POA: Diagnosis not present

## 2023-02-24 MED ORDER — CYCLOBENZAPRINE HCL 5 MG PO TABS: 5.0000 mg | ORAL_TABLET | Freq: Every evening | ORAL | 0 refills | Status: DC | PRN

## 2023-02-24 MED ORDER — GABAPENTIN 300 MG PO CAPS: 300.0000 mg | ORAL_CAPSULE | Freq: Every day | ORAL | 1 refills | Status: DC

## 2023-02-24 NOTE — Progress Notes (Signed)
PROVIDER NOTE: Information contained herein reflects review and annotations entered in association with encounter. Interpretation of such information and data should be left to medically-trained personnel. Information provided to patient can be located elsewhere in the medical record under "Patient Instructions". Document created using STT-dictation technology, any transcriptional errors that may result from process are unintentional.    Patient: Lindsay Fields  Service Category: E/M  Provider: Edward Jolly, MD  DOB: 21-Aug-1951  DOS: 02/24/2023  Specialty: Interventional Pain Management  MRN: [id number]  Setting: Ambulatory outpatient  PCP: [name], MD  Type: Established Patient    Referring Provider: [name], MD  Location: Office  Delivery: Face-to-face     HPI  Lindsay Fields, a 72 y.o. year old female, is here today because of her No primary diagnosis found.. Lindsay Fields primary complain today is Back Pain (lower) Last encounter: My last encounter with her was on 08/02/22 Pertinent problems: Lindsay Fields has Spondylosis without myelopathy or radiculopathy, lumbar region and Chronic pain syndrome on their pertinent problem list. Pain Assessment: Severity of Chronic pain is reported as a 6 /10. Location: Back Lower/left leg. Onset: More than a month ago. Quality: Aching. Timing: Constant. Modifying factor(s): sitting still, heating pad. Vitals:  height is 5\' 6"  (1.676 m) and weight is 144 lb (65.3 kg). Her temporal temperature is 97.2 F (36.2 C) (abnormal). Her blood pressure is 126/68 and her pulse is 61. Her respiration is 14 and oxygen saturation is 99%.   Reason for encounter: MM  Since her last clinic visit with me, patient was diagnosed with Parkinson's by Dr. [name] with neurology.  She is on carbidopa/levodopa.  She states that this has helped with her tremors.  She is here for a refill of gabapentin.  She is also requesting a muscle relaxer that she can take at night.   Tizanidine causes bad dreams for her. She denies any rigidity or shuffling gait.  She states that her tremors have improved with carbidopa/levodopa.  Her father had Parkinson's.   ROS  Constitutional: Denies any fever or chills Gastrointestinal: No reported hemesis, hematochezia, vomiting, or acute GI distress Musculoskeletal:  Left low back pain with radiation into left leg Neurological: No reported episodes of acute onset apraxia, aphasia, dysarthria, agnosia, amnesia, paralysis, loss of coordination, or loss of consciousness  Medication Review  Azelastine HCl, Calcium-Vitamin D, acetaminophen, bisoprolol, carbidopa-levodopa, cyclobenzaprine, fluticasone, gabapentin, levocetirizine, magnesium, raloxifene, rosuvastatin, and vitamin D3  History Review  Allergy: Lindsay Fields has No Known Allergies. Drug: Lindsay Fields  reports no history of drug use. Alcohol:  reports current alcohol use of about 3.0 - 5.0 standard drinks of alcohol per week. Tobacco:  reports that she has never smoked. She has never used smokeless tobacco. Social: Lindsay Fields  reports that she has never smoked. She has never used smokeless tobacco. She reports current alcohol use of about 3.0 - 5.0 standard drinks of alcohol per week. She reports that she does not use drugs. Medical:  has a past medical history of Allergic genetic state, Arthritis, Bladder polyps, Dysrhythmia, Fibrocystic breast disease, Frequent UTI, H/O degenerative disc disease, Hypercholesterolemia, and Osteoporosis. Surgical: Lindsay Fields  has a past surgical history that includes Bladder fulguration; Colonoscopy; Dilation and curettage of uterus; Abdominal hysterectomy; Ovarian cyst removal; Tonsillectomy and adenoidectomy; Colonoscopy with propofol (N/A, 07/12/2018); Colonoscopy (N/A, 03/31/2022); and Esophagogastroduodenoscopy (N/A, 03/31/2022). Family: family history includes Breast cancer (age of onset: 36) in her maternal aunt.  Laboratory Chemistry Profile    Renal  No results found for: "BUN", "CREATININE", "LABCREA", "BCR", "GFR", "GFRAA", "GFRNONAA", "LABVMA", "EPIRU", "EPINEPH24HUR", "NOREPRU", "NOREPI24HUR", "DOPARU", "DOPAM24HRUR"  Hepatic No results found for: "AST", "ALT", "ALBUMIN", "ALKPHOS", "HCVAB", "AMYLASE", "LIPASE", "AMMONIA"  Electrolytes No results found for: "NA", "K", "CL", "CALCIUM", "MG", "PHOS"  Bone No results found for: "VD25OH", "VD125OH2TOT", "ZO1096EA5", "WU9811BJ4", "25OHVITD1", "25OHVITD2", "25OHVITD3", "TESTOFREE", "TESTOSTERONE"  Inflammation (CRP: Acute Phase) (ESR: Chronic Phase) No results found for: "CRP", "ESRSEDRATE", "LATICACIDVEN"       Note: Above Lab results reviewed.   Physical Exam  General appearance: Well nourished, well developed, and well hydrated. In no apparent acute distress Mental status: Alert, oriented x 3 (person, place, & time)       Respiratory: No evidence of acute respiratory distress Eyes: PERLA Vitals: BP 126/68   Pulse 61   Temp (!) 97.2 F (36.2 C) (Temporal)   Resp 14   Ht 5\' 6"  (1.676 m)   Wt 144 lb (65.3 kg)   SpO2 99%   BMI 23.24 kg/m  BMI: Estimated body mass index is 23.24 kg/m as calculated from the following:   Height as of this encounter: 5\' 6"  (1.676 m).   Weight as of this encounter: 144 lb (65.3 kg). Ideal: Ideal body weight: 59.3 kg (130 lb 11.7 oz) Adjusted ideal body weight: 61.7 kg (136 lb 0.6 oz)   Left low back pain with radiation into left leg 5 out of 5 strength bilateral lower extremity: Plantar flexion, dorsiflexion, knee flexion, knee extension.   Assessment   Diagnosis Status  1. Chronic pain syndrome   2. Lumbar radicular pain (LEFT L5/S1)   3. Lumbar facet arthropathy   4. 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 Spondylosis without myelopathy or radiculopathy, lumbar region      Persistent Persistent Persistent    Plan of Care     Pharmacotherapy (Medications Ordered): Meds ordered this encounter  Medications    gabapentin (NEURONTIN) 300 MG capsule    Sig: Take 1 capsule (300 mg total) by mouth at bedtime.    Dispense:  90 capsule    Refill:  1    Fill one day early if pharmacy is closed on scheduled refill date. May substitute for generic if available.   cyclobenzaprine (FLEXERIL) 5 MG tablet    Sig: Take 1-2 tablets (5-10 mg total) by mouth at bedtime as needed for muscle spasms.    Dispense:  90 tablet    Refill:  0    Do not place this medication, or any other prescription from our practice, on "Automatic Refill". Patient may have prescription filled one day early if pharmacy is closed on scheduled refill date.   Orders:  No orders of the defined types were placed in this encounter.  Follow-up plan:   Return in about 6 months (around 08/27/2023) for Medication Management, in person.    Recent Visits No visits were found meeting these conditions. Showing recent visits within past 90 days and meeting all other requirements Today's Visits Date Type Provider Dept  02/24/23 Office Visit Edward Jolly, MD [facility]  Showing today's visits and meeting all other requirements Future Appointments No visits were found meeting these conditions. Showing future appointments within next 90 days and meeting all other requirements  I discussed the assessment and treatment plan with the patient. The patient was provided an opportunity to ask questions and all were answered. The patient agreed with the plan and demonstrated an understanding of the instructions.  Patient advised to  call back or seek an in-person evaluation if the symptoms or condition worsens.  Duration of encounter: .  Note by: Edward Jolly, MD Date: 02/24/2023; Time: 3:26 PM

## 2023-03-24 ENCOUNTER — Encounter: Payer: Medicare HMO | Admitting: Orthopedic Surgery

## 2023-04-21 ENCOUNTER — Encounter: Payer: Medicare HMO | Admitting: Neurosurgery

## 2023-05-05 NOTE — Progress Notes (Unsigned)
Referring Physician:  [name], MD [address] [facility] [address] [place],  [place] [address]  Primary Physician:  [name], MD  History of Present Illness: 05/05/2023 [name] has a history of parkinsons, hyperlipidemia, osteoporosis, CKD, chronic pain, and prediabetes.   She sees neurology for her parkinsons. She sees pain management (Lateef). She had peripheral nerve stimulator implant done on 10/19/21 with [name] (trial), but it did not really help much. She did not get permanent implant.    Last seen by Dr. Myer Fields on 02/08/23 to discuss surgery options. He discussed L5-S1 MIS TLIF. She had a DEXA done after this visit and he recommended treatment for osteoporosis prior to any lumbar fusion.   She has seen endocrinology and was started on tymlos daily injections. She was having side effects and they discussed possible switch to prolia if no improvement.   She was advised by endocrine to stop the tymlos. She was on it for 2 months. She has appointment with endocrine next week to discuss starting a new medication.   She continues with constant LBP with intermittent left lateral leg pain to her knee. No right leg pain. She notes weakness in left leg.  Care with NSAIDs due to CKD.   Bowel/Bladder Dysfunction: none  Conservative measures:  Physical therapy: did 6 weeks (strength and balance) and was discharged February 2024. This was at [facility] on [address].  Multimodal medical therapy including regular antiinflammatories: tylenol, robaxin, neurontin, zanaflex (hallucinations) Injections:  08/02/22 LEFT L5-S1 IL ESI (Lateef) 07/22/2021: Left SI joint injection (no relief) 06/24/2021: Bilateral S1 transforaminal ESI (no relief) 06/10/2021: LEFT S1 trigger point injection (no relief) 04/08/2021: RFA to the bilateral L4-5 and L5-S1 facet joints (no relief)  03/02/2021: MBB to the bilateral L4-5 and L5-S1 facet joints (8/10 to  1/10) 02/13/2021: MBB to the bilateral L4-5 and L5-S1 facet joints (8/10 to 1/10) 06/23/2020: Bilateral L4 and bilateral L5 trigger point injections (3 days relief)   Past Surgery:  PNS trial with Lateef 2023  The symptoms are causing a significant impact on the patient's life.   Review of Systems:  A 10 point review of systems is negative, except for the pertinent positives and negatives detailed in the HPI.  Past Medical History: Past Medical History:  Diagnosis Date   Allergic genetic state    Arthritis    Bladder polyps    Dysrhythmia    PVC's   Fibrocystic breast disease    Frequent UTI    H/O degenerative disc disease    Hypercholesterolemia    Osteoporosis     Past Surgical History: Past Surgical History:  Procedure Laterality Date   ABDOMINAL HYSTERECTOMY     BLADDER FULGURATION     COLONOSCOPY     COLONOSCOPY N/A 03/31/2022   Procedure: COLONOSCOPY;  Surgeon: Toledo, Boykin Nearing, MD;  Location: [facility] ENDOSCOPY;  Service: Gastroenterology;  Laterality: N/A;   COLONOSCOPY WITH PROPOFOL N/A 07/12/2018   Procedure: COLONOSCOPY WITH PROPOFOL;  Surgeon: Toledo, Boykin Nearing, MD;  Location: [facility] ENDOSCOPY;  Service: Gastroenterology;  Laterality: N/A;   DILATION AND CURETTAGE OF UTERUS     ESOPHAGOGASTRODUODENOSCOPY N/A 03/31/2022   Procedure: ESOPHAGOGASTRODUODENOSCOPY (EGD);  Surgeon: Toledo, Boykin Nearing, MD;  Location: [facility] ENDOSCOPY;  Service: Gastroenterology;  Laterality: N/A;   OVARIAN CYST REMOVAL     TONSILLECTOMY AND ADENOIDECTOMY      Allergies: Allergies as of 05/09/2023   (No Known Allergies)    Medications: Outpatient Encounter Medications  as of 05/09/2023  Medication Sig   acetaminophen (TYLENOL) 500 MG tablet Take 1,000 mg by mouth every 6 (six) hours as needed.   Azelastine HCl 137 MCG/SPRAY SOLN Place 137 mcg into the nose.   bisoprolol (ZEBETA) 5 MG tablet Take 2.5 mg by mouth daily.   CALCIUM-VITAMIN D PO Take by mouth daily.   carbidopa-levodopa  (PARCOPA) 25-100 MG disintegrating tablet Take 1 tablet by mouth 3 (three) times daily.   Cholecalciferol (VITAMIN D3) 50 MCG (2000 UT) CAPS Take 2,000 Units by mouth every other day.   cyclobenzaprine (FLEXERIL) 5 MG tablet Take 1-2 tablets (5-10 mg total) by mouth at bedtime as needed for muscle spasms.   fluticasone (FLONASE) 50 MCG/ACT nasal spray Place 2 sprays into both nostrils 2 (two) times daily as needed for allergies or rhinitis.   gabapentin (NEURONTIN) 300 MG capsule Take 1 capsule (300 mg total) by mouth at bedtime.   levocetirizine (XYZAL) 5 MG tablet Take 5 mg by mouth every evening.   magnesium 30 MG tablet Take 250 mg by mouth daily.   raloxifene (EVISTA) 60 MG tablet Take 60 mg by mouth daily.   rosuvastatin (CRESTOR) 5 MG tablet Take 1 tablet by mouth daily.   No facility-administered encounter medications on file as of 05/09/2023.    Social History: Social History   Tobacco Use   Smoking status: Never   Smokeless tobacco: Never  Vaping Use   Vaping status: Never Used  Substance Use Topics   Alcohol use: Yes    Alcohol/week: 3.0 - 5.0 standard drinks of alcohol    Types: 3 - 5 Glasses of wine per week   Drug use: Never    Family Medical History: Family History  Problem Relation Age of Onset   Breast cancer Maternal Aunt 36    Physical Examination: There were no vitals filed for this visit.    Awake, alert, oriented to person, place, and time.  Speech is clear and fluent. Fund of knowledge is appropriate.   Cranial Nerves: Pupils equal round and reactive to light.  Facial tone is symmetric.    No abnormal lesions on exposed skin.   Strength:  Side Iliopsoas Quads Hamstring PF DF EHL  R 5 5 5 5 5 5   L 5 5 5 5 5 5    Clonus is not present.   Bilateral lower extremity sensation is intact to light touch.     Gait is normal.    Medical Decision Making  Imaging: None   Assessment and Plan: [name] is a pleasant 72 y.o. female has  2 year history  of constant LBP with intermittent left lateral leg pain to her knee. No right leg pain. She notes weakness in left leg.  She has known slip at L5-S1 with facet hypertrophy. Dr. Myer Fields has discussed L5-S1 MIS TLIF.  Treatment options discussed with patient and following plan made:   - Can discuss scheduling surgery after treatment of osteoporosis x 3 months. She has done 2 months.  - Will message her in 2 weeks (after she sees endocrine next week) to regroup and decide on follow up.   I spent a total of 10 minutes in face-to-face and non-face-to-face activities related to this patient's care today including review of outside records, review of imaging, review of symptoms, physical exam, discussion of differential diagnosis, discussion of treatment options, and documentation.   Drake Leach PA-C Dept. of Neurosurgery

## 2023-05-09 ENCOUNTER — Encounter: Payer: Self-pay | Admitting: Orthopedic Surgery

## 2023-05-09 ENCOUNTER — Ambulatory Visit: Payer: Medicare HMO | Admitting: Orthopedic Surgery

## 2023-05-09 VITALS — BP 118/72 | Wt 141.0 lb

## 2023-05-09 DIAGNOSIS — M4316 Spondylolisthesis, lumbar region: Secondary | ICD-10-CM | POA: Diagnosis not present

## 2023-05-09 DIAGNOSIS — M47816 Spondylosis without myelopathy or radiculopathy, lumbar region: Secondary | ICD-10-CM

## 2023-05-09 DIAGNOSIS — M5416 Radiculopathy, lumbar region: Secondary | ICD-10-CM

## 2023-05-09 DIAGNOSIS — M810 Age-related osteoporosis without current pathological fracture: Secondary | ICD-10-CM

## 2023-05-09 DIAGNOSIS — M4726 Other spondylosis with radiculopathy, lumbar region: Secondary | ICD-10-CM | POA: Diagnosis not present

## 2023-05-31 ENCOUNTER — Encounter: Payer: Medicare HMO | Admitting: Orthopedic Surgery

## 2023-06-21 ENCOUNTER — Other Ambulatory Visit: Payer: Self-pay | Admitting: Student in an Organized Health Care Education/Training Program

## 2023-06-24 ENCOUNTER — Other Ambulatory Visit: Payer: Self-pay | Admitting: Internal Medicine

## 2023-06-24 DIAGNOSIS — Z1231 Encounter for screening mammogram for malignant neoplasm of breast: Secondary | ICD-10-CM

## 2023-06-29 NOTE — Progress Notes (Unsigned)
Telephone Visit- Progress Note: Referring Physician:  No referring provider defined for this encounter.  Primary Physician:  [name], MD  This visit was performed via telephone.  Patient location: home Provider location: office  I spent a total of 10 minutes non-face-to-face activities for this visit on the date of this encounter including review of current clinical condition and response to treatment.    Patient has given verbal consent to this telephone visits and we reviewed the limitations of a telephone visit. Patient wishes to proceed.    Chief Complaint:  follow up  History of Present Illness: Lindsay Fields is a 72 y.o. female has a history of parkinsons, hyperlipidemia, osteoporosis, CKD, chronic pain, and prediabetes.    She sees neurology for her parkinsons. She sees pain management (Lateef). She had peripheral nerve stimulator implant done on 10/19/21 with [name] (trial), but it did not really help much. She did not get permanent implant.     Last seen by Dr. Myer Haff on 02/08/23 to discuss surgery options. He discussed L5-S1 MIS TLIF. She had a DEXA done after this visit and he recommended treatment for osteoporosis prior to any lumbar fusion.   She was on tymlos for osteoporosis but had to stop after 2 months due to side effects. Looks like she was recently started on prolia q 6 months- had first dose on 06/23/23. She has not noticed any side effects.   No change in her symptoms. She has constant LBP with intermittent left lateral leg pain to her knee. No right leg pain. She notes weakness in left leg.   Care with NSAIDs due to CKD.    Bowel/Bladder Dysfunction: none   Conservative measures:  Physical therapy: did 6 weeks (strength and balance) and was discharged February 2024. This was at [facility] on [place].  Multimodal medical therapy including regular antiinflammatories: tylenol, robaxin, neurontin, zanaflex (hallucinations) Injections:   08/02/22 LEFT L5-S1 IL ESI (Lateef) 07/22/2021: Left SI joint injection (no relief) 06/24/2021: Bilateral S1 transforaminal ESI (no relief) 06/10/2021: LEFT S1 trigger point injection (no relief) 04/08/2021: RFA to the bilateral L4-5 and L5-S1 facet joints (no relief)  03/02/2021: MBB to the bilateral L4-5 and L5-S1 facet joints (8/10 to 1/10) 02/13/2021: MBB to the bilateral L4-5 and L5-S1 facet joints (8/10 to 1/10) 06/23/2020: Bilateral L4 and bilateral L5 trigger point injections (3 days relief)    Past Surgery:  PNS trial with Lateef 2023   The symptoms are causing a significant impact on the patient's life.     Exam: No exam done as this was a telephone encounter.     Imaging: none  Assessment and Plan: [name] is a pleasant 72 y.o. female with a 2 year history of constant LBP with intermittent left lateral leg pain to her knee. No right leg pain. She notes weakness in left leg.   She has known slip at L5-S1 with facet hypertrophy. Dr. Myer Haff has discussed L5-S1 MIS TLIF.  She has known osteoporosis and he wanted her to do 3 months of treatment prior to surgery.    Treatment options discussed with patient and following plan made:   - She was on tymlos daily injections for 2 months and had to stop due to side effects. She was started on prolia (q 6 months) and had her first dose on 06/23/23.  - Will schedule her to follow back up with Dr. Myer Haff to discuss surgery options again. She would like to have surgery this year if  possible.   Drake Leach PA-C Neurosurgery

## 2023-06-30 ENCOUNTER — Ambulatory Visit (INDEPENDENT_AMBULATORY_CARE_PROVIDER_SITE_OTHER): Payer: Medicare HMO | Admitting: Orthopedic Surgery

## 2023-06-30 ENCOUNTER — Encounter: Payer: Self-pay | Admitting: Orthopedic Surgery

## 2023-06-30 ENCOUNTER — Ambulatory Visit
Admission: RE | Admit: 2023-06-30 | Discharge: 2023-06-30 | Disposition: A | Discharge status: Home / Self Care | Payer: Medicare HMO | Source: Ambulatory Visit | Attending: Internal Medicine | Admitting: Internal Medicine

## 2023-06-30 DIAGNOSIS — Z1231 Encounter for screening mammogram for malignant neoplasm of breast: Secondary | ICD-10-CM | POA: Diagnosis present

## 2023-06-30 DIAGNOSIS — M5127 Other intervertebral disc displacement, lumbosacral region: Secondary | ICD-10-CM | POA: Diagnosis not present

## 2023-06-30 DIAGNOSIS — M4726 Other spondylosis with radiculopathy, lumbar region: Secondary | ICD-10-CM

## 2023-06-30 DIAGNOSIS — M47816 Spondylosis without myelopathy or radiculopathy, lumbar region: Secondary | ICD-10-CM

## 2023-06-30 DIAGNOSIS — M5416 Radiculopathy, lumbar region: Secondary | ICD-10-CM

## 2023-06-30 DIAGNOSIS — M810 Age-related osteoporosis without current pathological fracture: Secondary | ICD-10-CM | POA: Diagnosis not present

## 2023-06-30 DIAGNOSIS — M4316 Spondylolisthesis, lumbar region: Secondary | ICD-10-CM

## 2023-07-05 ENCOUNTER — Other Ambulatory Visit: Payer: Self-pay | Admitting: Internal Medicine

## 2023-07-05 DIAGNOSIS — R928 Other abnormal and inconclusive findings on diagnostic imaging of breast: Secondary | ICD-10-CM

## 2023-07-05 DIAGNOSIS — N63 Unspecified lump in unspecified breast: Secondary | ICD-10-CM

## 2023-07-07 ENCOUNTER — Ambulatory Visit: Payer: Medicare HMO | Admitting: Neurosurgery

## 2023-07-07 ENCOUNTER — Ambulatory Visit
Admission: RE | Admit: 2023-07-07 | Discharge: 2023-07-07 | Disposition: A | Discharge status: Home / Self Care | Payer: Medicare HMO | Source: Ambulatory Visit | Attending: Internal Medicine | Admitting: Internal Medicine

## 2023-07-07 DIAGNOSIS — R928 Other abnormal and inconclusive findings on diagnostic imaging of breast: Secondary | ICD-10-CM | POA: Diagnosis present

## 2023-07-07 DIAGNOSIS — N63 Unspecified lump in unspecified breast: Secondary | ICD-10-CM | POA: Diagnosis present

## 2023-07-11 ENCOUNTER — Other Ambulatory Visit: Payer: Self-pay | Admitting: Internal Medicine

## 2023-07-11 DIAGNOSIS — R928 Other abnormal and inconclusive findings on diagnostic imaging of breast: Secondary | ICD-10-CM

## 2023-07-11 DIAGNOSIS — N63 Unspecified lump in unspecified breast: Secondary | ICD-10-CM

## 2023-07-12 ENCOUNTER — Ambulatory Visit: Payer: Medicare HMO | Admitting: Neurosurgery

## 2023-07-12 ENCOUNTER — Encounter: Payer: Self-pay | Admitting: Neurosurgery

## 2023-07-12 VITALS — BP 104/64 | Ht 66.0 in | Wt 141.0 lb

## 2023-07-12 DIAGNOSIS — M4316 Spondylolisthesis, lumbar region: Secondary | ICD-10-CM

## 2023-07-12 DIAGNOSIS — M532X6 Spinal instabilities, lumbar region: Secondary | ICD-10-CM

## 2023-07-12 DIAGNOSIS — M5442 Lumbago with sciatica, left side: Secondary | ICD-10-CM | POA: Diagnosis not present

## 2023-07-12 DIAGNOSIS — G8929 Other chronic pain: Secondary | ICD-10-CM

## 2023-07-12 NOTE — H&P (View-Only) (Signed)
Referring Physician:  [name], MD [address] [facility] [place],  [place] [address]  Primary Physician:  [name], MD  History of Present Illness: 07/12/2023 [name] returns to see me.  She has continued significant back pain as noted below.  She is tried and failed conservative management.  She has had 3 treatments with medications to help her bone density.  02/08/2023 [name] is here today with a chief complaint of low back pain that intermittently radiates into the left leg and stops at the knee. No pain in her right leg.   She has been having worsening pain over the past several years.  Standing makes her pain worse.  She is able to walk but has significant lower back discomfort.  She has some pain down her leg, but her back pain is more prominent.  Bowel/Bladder Dysfunction: none  Conservative measures:  Physical therapy: did 6 weeks (strength and balance) and was discharged February 2024. This was at [facility] on [address].   Multimodal medical therapy including regular antiinflammatories: tylenol, robaxin, neurontin, zanaflex   Injections: has received epidural steroid injections 08/02/22 LEFT L5-S1 IL ESI (Lateef) 07/22/2021: Left SI joint injection (no relief) 06/24/2021: Bilateral S1 transforaminal ESI (no relief) 06/10/2021: LEFT S1 trigger point injection (no relief) 04/08/2021: RFA to the bilateral L4-5 and L5-S1 facet joints (no relief)  03/02/2021: MBB to the bilateral L4-5 and L5-S1 facet joints (8/10 to 1/10) 02/13/2021: MBB to the bilateral L4-5 and L5-S1 facet joints (8/10 to 1/10) 06/23/2020: Bilateral L4 and bilateral L5 trigger point injections (3 days relief)   Past Surgery:  PNS trial with Lateef 2023   Lindsay Fields has no symptoms of cervical myelopathy.  The symptoms are causing a significant impact on the patient's life.    Progress Note from Drake Leach, [name] on 01/18/23:   History of Present  Illness: 01/07/2023 [name] has a history of parkinsons, hyperlipidemia, osteoporosis, CKD, chronic pain, and prediabetes.    She sees neurology for her parkinsons. She sees pain management (Lateef). She had peripheral nerve stimulator implant done on 10/19/21 with [name] (trial), but it did not really help much. She did not get permanent implant.     Last seen by me on 12/22/22 for 2 year history of constant LBP with intermittent left lateral leg pain to her knee. No right leg pain. She notes weakness in left leg.   Previous MRI showed lumbar spondylosis with facet hypertrophy and left sided facet cyst at L5-S1.    MRI of lumbar spine was ordered and she is here to review it.    Her symptoms are the same. She has constant LBP with intermittent left lateral leg pain to her knee. She also has known left hip OA. No right leg pain. She notes weakness in left leg. Pain is worse with walking and stairs.    She did 6 weeks of PT and was discharged in February of 2024- she saw some relief. Injections as above. No lumbar surgery.    Care with NSAIDs due to CKD.   I have utilized the care everywhere function in epic to review the outside records available from external health systems.  Review of Systems:  A 10 point review of systems is negative, except for the pertinent positives and negatives detailed in the HPI.  Past Medical History: Past Medical History:  Diagnosis Date   Allergic genetic state    Arthritis  Bladder polyps    Dysrhythmia    PVC's   Fibrocystic breast disease    Frequent UTI    H/O degenerative disc disease    Hypercholesterolemia    Osteoporosis     Past Surgical History: Past Surgical History:  Procedure Laterality Date   ABDOMINAL HYSTERECTOMY     BLADDER FULGURATION     COLONOSCOPY     COLONOSCOPY N/A 03/31/2022   Procedure: COLONOSCOPY;  Surgeon: Toledo, Boykin Nearing, MD;  Location: [facility] ENDOSCOPY;  Service: Gastroenterology;  Laterality: N/A;    COLONOSCOPY WITH PROPOFOL N/A 07/12/2018   Procedure: COLONOSCOPY WITH PROPOFOL;  Surgeon: Toledo, Boykin Nearing, MD;  Location: [facility] ENDOSCOPY;  Service: Gastroenterology;  Laterality: N/A;   DILATION AND CURETTAGE OF UTERUS     ESOPHAGOGASTRODUODENOSCOPY N/A 03/31/2022   Procedure: ESOPHAGOGASTRODUODENOSCOPY (EGD);  Surgeon: Toledo, Boykin Nearing, MD;  Location: [facility] ENDOSCOPY;  Service: Gastroenterology;  Laterality: N/A;   OVARIAN CYST REMOVAL     TONSILLECTOMY AND ADENOIDECTOMY      Allergies: Allergies as of 07/12/2023 - Review Complete 07/12/2023  Allergen Reaction Noted   Abaloparatide Other (See Comments) 05/17/2023    Medications:  Current Outpatient Medications:    acetaminophen (TYLENOL) 500 MG tablet, Take 1,000 mg by mouth every 6 (six) hours as needed., Disp: , Rfl:    Azelastine HCl 137 MCG/SPRAY SOLN, Place 137 mcg into the nose., Disp: , Rfl:    bisoprolol (ZEBETA) 5 MG tablet, Take 2.5 mg by mouth daily., Disp: , Rfl:    CALCIUM-VITAMIN D PO, Take by mouth daily., Disp: , Rfl:    carbidopa-levodopa (PARCOPA) 25-100 MG disintegrating tablet, Take 1 tablet by mouth 3 (three) times daily., Disp: , Rfl:    Cholecalciferol (VITAMIN D3) 50 MCG (2000 UT) CAPS, Take 2,000 Units by mouth every other day., Disp: , Rfl:    cyclobenzaprine (FLEXERIL) 5 MG tablet, Take 1-2 tablets (5-10 mg total) by mouth at bedtime as needed for muscle spasms., Disp: 90 tablet, Rfl: 0   fluticasone (FLONASE) 50 MCG/ACT nasal spray, Place 2 sprays into both nostrils 2 (two) times daily as needed for allergies or rhinitis., Disp: , Rfl:    gabapentin (NEURONTIN) 300 MG capsule, Take 1 capsule (300 mg total) by mouth at bedtime., Disp: 90 capsule, Rfl: 1   levocetirizine (XYZAL) 5 MG tablet, Take 5 mg by mouth every evening., Disp: , Rfl:    magnesium 30 MG tablet, Take 250 mg by mouth daily., Disp: , Rfl:    rosuvastatin (CRESTOR) 5 MG tablet, Take 1 tablet by mouth daily., Disp: , Rfl:    TYMLOS 3120  MCG/1.56ML SOPN, Inject into the skin. daily, Disp: , Rfl:   Social History: Social History   Tobacco Use   Smoking status: Never   Smokeless tobacco: Never  Vaping Use   Vaping status: Never Used  Substance Use Topics   Alcohol use: Yes    Alcohol/week: 3.0 - 5.0 standard drinks of alcohol    Types: 3 - 5 Glasses of wine per week   Drug use: Never    Family Medical History: Family History  Problem Relation Age of Onset   Breast cancer Maternal Aunt 93    Physical Examination: Vitals:   07/12/23 1446  BP: 104/64    General: Patient is in no apparent distress. Attention to examination is appropriate.  Neck:   Supple.  Full range of motion.  Respiratory: Patient is breathing without any difficulty.   NEUROLOGICAL:     Awake, alert,  oriented to person, place, and time.  Speech is clear and fluent.   Cranial Nerves: Pupils equal round and reactive to light.  Facial tone is symmetric.  Facial sensation is symmetric. Shoulder shrug is symmetric. Tongue protrusion is midline.  There is no pronator drift.  ROM of spine: full.    Strength: Side Biceps Triceps Deltoid Interossei Grip Wrist Ext. Wrist Flex.  R 5 5 5 5 5 5 5   L 5 5 5 5 5 5 5    Side Iliopsoas Quads Hamstring PF DF EHL  R 5 5 5 5 5 5   L 5 5 5 5 5 5    Reflexes are 1+ and symmetric at the biceps, triceps, brachioradialis, patella and achilles.   Hoffman's is absent.   Bilateral upper and lower extremity sensation is intact to light touch.    No evidence of dysmetria noted.  Gait is slowed and antalgic. She has some pain with L leg extension     Medical Decision Making  Imaging: L spine Flex Ext 01/25/2023 FINDINGS: No compression deformities. No osteolytic or osteoblastic changes. Grade 1 L5 anterolisthesis with respect of the sacrum. There is no motion observed with flexion and extension to suggest instability. Facet joint sclerosis and osteophyte formation L4-5 and L5-S1.    IMPRESSION: Lumbosacral mild spondylolisthesis and facet joint degenerative changes.     Electronically Signed   By: Layla Maw M.D.   On: 01/29/2023 11:57  MR Lumbar Spine 01/25/2023 Disc levels:   T12- L1: Unremarkable.   L1-L2: Disc desiccation and narrowing with mild bulge   L2-L3: Unremarkable.   L3-L4: Disc height loss and bulging.   L4-L5: Mild disc height loss and facet spurring. No neural compression   L5-S1:Advanced facet degeneration with spurring and anterolisthesis. The disc is narrowed and bulging with mild endplate degeneration.   IMPRESSION: Generalized lumbar spine degeneration greatest at L5-S1 where there is anterolisthesis and degenerative edema. No neural compression.     Electronically Signed   By: Tiburcio Pea M.D.   On: 01/05/2023 05:12    I have personally reviewed the images and agree with the above interpretation.  I have the following additions to the read.  She has approximately 3 mm anterolisthesis on her supine MRI scan and on her extension films.  The anterolisthesis extends to 6 mm on flexion.  Please note that she had an MRI scan performed in November 2022.  There is no anterolisthesis at L5-S1 seen on that study.  Assessment and Plan: Lindsay Fields is a pleasant 72 y.o. female with spondylolisthesis at L5-S1 with approximately 3 mm on extension and 6 mm on flexion.  Is also present on supine MRI scan.  This anterolisthesis was not present on her MRI scan from 1-1/2 years ago.  Thus, she has an implied spinal instability that is likely degenerative in nature.  She is spondylolisthesis of L5 on S1.  She has low back pain with some radicular symptoms consistent with sciatica on the left side.  She has tried extensive conservative management without improvement.  At this point, I feel that she has implied instability due to the worsening of her anterolisthesis at L5-S1 which was not present in the recent past.  I have recommended an  L5-S1 transforaminal lumbar interbody fusion to address her instability and decompress her left sided nerve roots.  I discussed the planned procedure at length with the patient, including the risks, benefits, alternatives, and indications. The risks discussed include but are not limited to bleeding,  infection, need for reoperation, spinal fluid leak, stroke, vision loss, anesthetic complication, coma, paralysis, and even death. I also described in detail that improvement was not guaranteed.  The patient expressed understanding of these risks, and asked that we proceed with surgery. I described the surgery in layman's terms, and gave ample opportunity for questions, which were answered to the best of my ability.  I spent a total of 30 minutes in this patient's care today. This time was spent reviewing pertinent records including imaging studies, obtaining and confirming history, performing a directed evaluation, formulating and discussing my recommendations, and documenting the visit within the medical record.   She is having a breast biopsy next week.  Provided that is benign, we will plan on moving forward in the coming weeks.   Thank you for involving me in the care of this patient.      Aashish Hamm K. Myer Haff MD, [facility] Neurosurgery

## 2023-07-12 NOTE — Patient Instructions (Signed)
Please see below for information in regards to your upcoming surgery:   Planned surgery: Left L5-S1 transforaminal lumbar interbody fusion   Surgery date: (contact us when you are ready to schedule - he operates on Mondays and Wednesdays) at [facility] (Medical Mall: [address] [place], [place] [address]) - you will find out your arrival time the business day before your surgery.   Pre-op appointment at [facility] Pre-admit Testing: we will call you with a date/time for this. If you are scheduled for an in person appointment, Pre-admit Testing is located on the first floor of the [facility] building, [address], [address]. Please bring all prescriptions in the original prescription bottles to your appointment. During this appointment, they will advise you which medications you can take the morning of surgery, and which medications you will need to hold for surgery. Labs (such as blood work, EKG) may be done at your pre-op appointment. You are not required to fast for these labs. Should you need to change your pre-op appointment, please call Pre-admit testing at [phone].      Surgical clearance: we will send a clearance form to Dr Dareen Piano     NSAIDS (Non-steroidal anti-inflammatory drugs): because you are having a fusion, please avoid taking any NSAIDS (examples: ibuprofen, motrin, aleve, naproxen, meloxicam, diclofenac) for 3 months after surgery. Celebrex is an exception and is OK to take, if prescribed. Tylenol is not an NSAID.    Common restrictions after surgery: No bending, lifting, or twisting ("BLT"). Avoid lifting objects heavier than 10 pounds for the first 6 weeks after surgery. Where possible, avoid household activities that involve lifting, bending, reaching, pushing, or pulling such as laundry, vacuuming, grocery shopping, and childcare. Try to arrange for help from friends and family for these activities while you heal. Do  not drive while taking prescription pain medication. Weeks 6 through 12 after surgery: avoid lifting more than 25 pounds.    X-rays after surgery: Because you are having a fusion: for appointments after your 2 week follow-up: please arrive at the [facility] outpatient imaging center ([address], [address], [place]) or [facility] one hour prior to your appointment for x-rays. This applies to every appointment after your 2 week follow-up. Failure to do so may result in your appointment being rescheduled.   How to contact us:  If you have any questions/concerns before or after surgery, you can reach [place] at [phone], or you can send a mychart message. We can be reached by phone or mychart 8am-4pm, Monday-Friday.  *Please note: Calls after 4pm are forwarded to a third party answering service. Mychart messages are not routinely monitored during evenings, weekends, and holidays. Please call our office to contact the answering service for urgent concerns during non-business hours.     Appointments/FMLA & disability paperwork: Joycelyn Rua, & Flonnie Hailstone Nurse: Royston Cowper  Medical assistants: Nash Mantis Physician Assistants: Manning Charity & Drake Leach Surgeons: Venetia Night, MD & Ernestine Mcmurray, MD

## 2023-07-12 NOTE — Progress Notes (Signed)
Referring Physician:  [name], MD [address] [facility] [place],  [place] [address]  Primary Physician:  [name], MD  History of Present Illness: 07/12/2023 [name] returns to see me.  She has continued significant back pain as noted below.  She is tried and failed conservative management.  She has had 3 treatments with medications to help her bone density.  02/08/2023 [name] is here today with a chief complaint of low back pain that intermittently radiates into the left leg and stops at the knee. No pain in her right leg.   She has been having worsening pain over the past several years.  Standing makes her pain worse.  She is able to walk but has significant lower back discomfort.  She has some pain down her leg, but her back pain is more prominent.  Bowel/Bladder Dysfunction: none  Conservative measures:  Physical therapy: did 6 weeks (strength and balance) and was discharged February 2024. This was at [facility] on [address].   Multimodal medical therapy including regular antiinflammatories: tylenol, robaxin, neurontin, zanaflex   Injections: has received epidural steroid injections 08/02/22 LEFT L5-S1 IL ESI (Lateef) 07/22/2021: Left SI joint injection (no relief) 06/24/2021: Bilateral S1 transforaminal ESI (no relief) 06/10/2021: LEFT S1 trigger point injection (no relief) 04/08/2021: RFA to the bilateral L4-5 and L5-S1 facet joints (no relief)  03/02/2021: MBB to the bilateral L4-5 and L5-S1 facet joints (8/10 to 1/10) 02/13/2021: MBB to the bilateral L4-5 and L5-S1 facet joints (8/10 to 1/10) 06/23/2020: Bilateral L4 and bilateral L5 trigger point injections (3 days relief)   Past Surgery:  PNS trial with Lateef 2023   Lindsay Fields has no symptoms of cervical myelopathy.  The symptoms are causing a significant impact on the patient's life.    Progress Note from Drake Leach, [name] on 01/18/23:   History of Present  Illness: 01/07/2023 [name] has a history of parkinsons, hyperlipidemia, osteoporosis, CKD, chronic pain, and prediabetes.    She sees neurology for her parkinsons. She sees pain management (Lateef). She had peripheral nerve stimulator implant done on 10/19/21 with [name] (trial), but it did not really help much. She did not get permanent implant.     Last seen by me on 12/22/22 for 2 year history of constant LBP with intermittent left lateral leg pain to her knee. No right leg pain. She notes weakness in left leg.   Previous MRI showed lumbar spondylosis with facet hypertrophy and left sided facet cyst at L5-S1.    MRI of lumbar spine was ordered and she is here to review it.    Her symptoms are the same. She has constant LBP with intermittent left lateral leg pain to her knee. She also has known left hip OA. No right leg pain. She notes weakness in left leg. Pain is worse with walking and stairs.    She did 6 weeks of PT and was discharged in February of 2024- she saw some relief. Injections as above. No lumbar surgery.    Care with NSAIDs due to CKD.   I have utilized the care everywhere function in epic to review the outside records available from external health systems.  Review of Systems:  A 10 point review of systems is negative, except for the pertinent positives and negatives detailed in the HPI.  Past Medical History: Past Medical History:  Diagnosis Date   Allergic genetic state    Arthritis  Bladder polyps    Dysrhythmia    PVC's   Fibrocystic breast disease    Frequent UTI    H/O degenerative disc disease    Hypercholesterolemia    Osteoporosis     Past Surgical History: Past Surgical History:  Procedure Laterality Date   ABDOMINAL HYSTERECTOMY     BLADDER FULGURATION     COLONOSCOPY     COLONOSCOPY N/A 03/31/2022   Procedure: COLONOSCOPY;  Surgeon: Toledo, Boykin Nearing, MD;  Location: [facility] ENDOSCOPY;  Service: Gastroenterology;  Laterality: N/A;    COLONOSCOPY WITH PROPOFOL N/A 07/12/2018   Procedure: COLONOSCOPY WITH PROPOFOL;  Surgeon: Toledo, Boykin Nearing, MD;  Location: [facility] ENDOSCOPY;  Service: Gastroenterology;  Laterality: N/A;   DILATION AND CURETTAGE OF UTERUS     ESOPHAGOGASTRODUODENOSCOPY N/A 03/31/2022   Procedure: ESOPHAGOGASTRODUODENOSCOPY (EGD);  Surgeon: Toledo, Boykin Nearing, MD;  Location: [facility] ENDOSCOPY;  Service: Gastroenterology;  Laterality: N/A;   OVARIAN CYST REMOVAL     TONSILLECTOMY AND ADENOIDECTOMY      Allergies: Allergies as of 07/12/2023 - Review Complete 07/12/2023  Allergen Reaction Noted   Abaloparatide Other (See Comments) 05/17/2023    Medications:  Current Outpatient Medications:    acetaminophen (TYLENOL) 500 MG tablet, Take 1,000 mg by mouth every 6 (six) hours as needed., Disp: , Rfl:    Azelastine HCl 137 MCG/SPRAY SOLN, Place 137 mcg into the nose., Disp: , Rfl:    bisoprolol (ZEBETA) 5 MG tablet, Take 2.5 mg by mouth daily., Disp: , Rfl:    CALCIUM-VITAMIN D PO, Take by mouth daily., Disp: , Rfl:    carbidopa-levodopa (PARCOPA) 25-100 MG disintegrating tablet, Take 1 tablet by mouth 3 (three) times daily., Disp: , Rfl:    Cholecalciferol (VITAMIN D3) 50 MCG (2000 UT) CAPS, Take 2,000 Units by mouth every other day., Disp: , Rfl:    cyclobenzaprine (FLEXERIL) 5 MG tablet, Take 1-2 tablets (5-10 mg total) by mouth at bedtime as needed for muscle spasms., Disp: 90 tablet, Rfl: 0   fluticasone (FLONASE) 50 MCG/ACT nasal spray, Place 2 sprays into both nostrils 2 (two) times daily as needed for allergies or rhinitis., Disp: , Rfl:    gabapentin (NEURONTIN) 300 MG capsule, Take 1 capsule (300 mg total) by mouth at bedtime., Disp: 90 capsule, Rfl: 1   levocetirizine (XYZAL) 5 MG tablet, Take 5 mg by mouth every evening., Disp: , Rfl:    magnesium 30 MG tablet, Take 250 mg by mouth daily., Disp: , Rfl:    rosuvastatin (CRESTOR) 5 MG tablet, Take 1 tablet by mouth daily., Disp: , Rfl:    TYMLOS 3120  MCG/1.56ML SOPN, Inject into the skin. daily, Disp: , Rfl:   Social History: Social History   Tobacco Use   Smoking status: Never   Smokeless tobacco: Never  Vaping Use   Vaping status: Never Used  Substance Use Topics   Alcohol use: Yes    Alcohol/week: 3.0 - 5.0 standard drinks of alcohol    Types: 3 - 5 Glasses of wine per week   Drug use: Never    Family Medical History: Family History  Problem Relation Age of Onset   Breast cancer Maternal Aunt 93    Physical Examination: Vitals:   07/12/23 1446  BP: 104/64    General: Patient is in no apparent distress. Attention to examination is appropriate.  Neck:   Supple.  Full range of motion.  Respiratory: Patient is breathing without any difficulty.   NEUROLOGICAL:     Awake, alert,  oriented to person, place, and time.  Speech is clear and fluent.   Cranial Nerves: Pupils equal round and reactive to light.  Facial tone is symmetric.  Facial sensation is symmetric. Shoulder shrug is symmetric. Tongue protrusion is midline.  There is no pronator drift.  ROM of spine: full.    Strength: Side Biceps Triceps Deltoid Interossei Grip Wrist Ext. Wrist Flex.  R 5 5 5 5 5 5 5   L 5 5 5 5 5 5 5    Side Iliopsoas Quads Hamstring PF DF EHL  R 5 5 5 5 5 5   L 5 5 5 5 5 5    Reflexes are 1+ and symmetric at the biceps, triceps, brachioradialis, patella and achilles.   Hoffman's is absent.   Bilateral upper and lower extremity sensation is intact to light touch.    No evidence of dysmetria noted.  Gait is slowed and antalgic. She has some pain with L leg extension     Medical Decision Making  Imaging: L spine Flex Ext 01/25/2023 FINDINGS: No compression deformities. No osteolytic or osteoblastic changes. Grade 1 L5 anterolisthesis with respect of the sacrum. There is no motion observed with flexion and extension to suggest instability. Facet joint sclerosis and osteophyte formation L4-5 and L5-S1.    IMPRESSION: Lumbosacral mild spondylolisthesis and facet joint degenerative changes.     Electronically Signed   By: Layla Maw M.D.   On: 01/29/2023 11:57  MR Lumbar Spine 01/25/2023 Disc levels:   T12- L1: Unremarkable.   L1-L2: Disc desiccation and narrowing with mild bulge   L2-L3: Unremarkable.   L3-L4: Disc height loss and bulging.   L4-L5: Mild disc height loss and facet spurring. No neural compression   L5-S1:Advanced facet degeneration with spurring and anterolisthesis. The disc is narrowed and bulging with mild endplate degeneration.   IMPRESSION: Generalized lumbar spine degeneration greatest at L5-S1 where there is anterolisthesis and degenerative edema. No neural compression.     Electronically Signed   By: Tiburcio Pea M.D.   On: 01/05/2023 05:12    I have personally reviewed the images and agree with the above interpretation.  I have the following additions to the read.  She has approximately 3 mm anterolisthesis on her supine MRI scan and on her extension films.  The anterolisthesis extends to 6 mm on flexion.  Please note that she had an MRI scan performed in November 2022.  There is no anterolisthesis at L5-S1 seen on that study.  Assessment and Plan: Lindsay Fields is a pleasant 72 y.o. female with spondylolisthesis at L5-S1 with approximately 3 mm on extension and 6 mm on flexion.  Is also present on supine MRI scan.  This anterolisthesis was not present on her MRI scan from 1-1/2 years ago.  Thus, she has an implied spinal instability that is likely degenerative in nature.  She is spondylolisthesis of L5 on S1.  She has low back pain with some radicular symptoms consistent with sciatica on the left side.  She has tried extensive conservative management without improvement.  At this point, I feel that she has implied instability due to the worsening of her anterolisthesis at L5-S1 which was not present in the recent past.  I have recommended an  L5-S1 transforaminal lumbar interbody fusion to address her instability and decompress her left sided nerve roots.  I discussed the planned procedure at length with the patient, including the risks, benefits, alternatives, and indications. The risks discussed include but are not limited to bleeding,  infection, need for reoperation, spinal fluid leak, stroke, vision loss, anesthetic complication, coma, paralysis, and even death. I also described in detail that improvement was not guaranteed.  The patient expressed understanding of these risks, and asked that we proceed with surgery. I described the surgery in layman's terms, and gave ample opportunity for questions, which were answered to the best of my ability.  I spent a total of 30 minutes in this patient's care today. This time was spent reviewing pertinent records including imaging studies, obtaining and confirming history, performing a directed evaluation, formulating and discussing my recommendations, and documenting the visit within the medical record.   She is having a breast biopsy next week.  Provided that is benign, we will plan on moving forward in the coming weeks.   Thank you for involving me in the care of this patient.      Aashish Hamm K. Myer Haff MD, [facility] Neurosurgery

## 2023-07-20 ENCOUNTER — Ambulatory Visit
Admission: RE | Admit: 2023-07-20 | Discharge: 2023-07-20 | Disposition: A | Discharge status: Home / Self Care | Payer: Medicare HMO | Source: Ambulatory Visit | Attending: Internal Medicine | Admitting: Internal Medicine

## 2023-07-20 ENCOUNTER — Telehealth: Payer: Self-pay | Admitting: Neurosurgery

## 2023-07-20 DIAGNOSIS — N63 Unspecified lump in unspecified breast: Secondary | ICD-10-CM

## 2023-07-20 DIAGNOSIS — N6001 Solitary cyst of right breast: Secondary | ICD-10-CM | POA: Insufficient documentation

## 2023-07-20 DIAGNOSIS — R928 Other abnormal and inconclusive findings on diagnostic imaging of breast: Secondary | ICD-10-CM

## 2023-07-20 MED ORDER — LIDOCAINE HCL (PF) 1 % IJ SOLN
10.0000 mL | Freq: Once | INTRAMUSCULAR | Status: AC
  Administered 2023-07-20: 10 mL via INTRADERMAL
  Filled 2023-07-20: qty 10

## 2023-07-20 NOTE — Telephone Encounter (Signed)
Patient called to let our office know that she has had her breast ultrasound and everything was clear. She is ready to proceed with scheduling surgery.

## 2023-07-20 NOTE — Telephone Encounter (Addendum)
I spoke with Lindsay Fields. She has chosen to proceed with surgery on 08/10/23. We discussed instructions in detail during her visit on 07/12/23 and these were provided to her on her AVS at that time.

## 2023-07-22 ENCOUNTER — Other Ambulatory Visit: Payer: Self-pay

## 2023-07-22 DIAGNOSIS — Z01818 Encounter for other preprocedural examination: Secondary | ICD-10-CM

## 2023-08-01 ENCOUNTER — Other Ambulatory Visit: Payer: Self-pay

## 2023-08-01 ENCOUNTER — Inpatient Hospital Stay: Admission: RE | Admit: 2023-08-01 | Payer: Medicare HMO | Source: Ambulatory Visit

## 2023-08-01 ENCOUNTER — Encounter
Admission: RE | Admit: 2023-08-01 | Discharge: 2023-08-01 | Disposition: A | Discharge status: Home / Self Care | Payer: Medicare HMO | Source: Ambulatory Visit | Attending: Neurosurgery | Admitting: Neurosurgery

## 2023-08-01 VITALS — BP 103/60 | HR 68 | Temp 97.9°F | Resp 13 | Ht 66.0 in | Wt 140.0 lb

## 2023-08-01 DIAGNOSIS — I10 Essential (primary) hypertension: Secondary | ICD-10-CM | POA: Diagnosis not present

## 2023-08-01 DIAGNOSIS — Z01818 Encounter for other preprocedural examination: Secondary | ICD-10-CM

## 2023-08-01 DIAGNOSIS — Z01812 Encounter for preprocedural laboratory examination: Secondary | ICD-10-CM

## 2023-08-01 HISTORY — DX: Chronic kidney disease, stage 3a: N18.31

## 2023-08-01 HISTORY — DX: Hyperlipidemia, unspecified: E78.5

## 2023-08-01 HISTORY — DX: Parkinson's disease without dyskinesia, without mention of fluctuations: G20.A1

## 2023-08-01 HISTORY — DX: Other intervertebral disc degeneration, lumbar region without mention of lumbar back pain or lower extremity pain: M51.369

## 2023-08-01 HISTORY — DX: Prediabetes: R73.03

## 2023-08-01 HISTORY — DX: Unilateral primary osteoarthritis, left hip: M16.12

## 2023-08-01 HISTORY — DX: Radiculopathy, lumbar region: M54.16

## 2023-08-01 LAB — URINALYSIS, COMPLETE (UACMP) WITH MICROSCOPIC
Bilirubin Urine: NEGATIVE
Glucose, UA: NEGATIVE mg/dL
Hgb urine dipstick: NEGATIVE
Ketones, ur: 5 mg/dL — AB
Leukocytes,Ua: NEGATIVE
Nitrite: NEGATIVE
Protein, ur: 30 mg/dL — AB
Specific Gravity, Urine: 1.03 (ref 1.005–1.030)
WBC, UA: 0 WBC/hpf (ref 0–5)
pH: 5 (ref 5.0–8.0)

## 2023-08-01 LAB — TYPE AND SCREEN
ABO/RH(D): A POS
Antibody Screen: NEGATIVE

## 2023-08-01 LAB — SURGICAL PCR SCREEN
MRSA, PCR: NEGATIVE
Staphylococcus aureus: NEGATIVE

## 2023-08-01 NOTE — Patient Instructions (Addendum)
Your procedure is scheduled on: Wednesday, October 30 Report to the Registration Desk on the 1st floor of the [facility] To find out your arrival time, please call [phone] between 1PM - 3PM on: Tuesday, October 29 If your arrival time is 6:00 am, do not arrive before that time as the [facility] entrance doors do not open until 6:00 am.  REMEMBER: Instructions that are not followed completely may result in serious medical risk, up to and including death; or upon the discretion of your surgeon and anesthesiologist your surgery may need to be rescheduled.  Do not eat food after midnight the night before surgery.  No gum chewing or hard candies.  You may however, drink CLEAR liquids up to 2 hours before you are scheduled to arrive for your surgery. Do not drink anything within 2 hours of your scheduled arrival time.  Clear liquids include: - water  - apple juice without pulp - gatorade (not RED colors) - black coffee or tea (Do NOT add milk or creamers to the coffee or tea) Do NOT drink anything that is not on this list.  One week prior to surgery: starting October 23 Stop ANY OVER THE COUNTER supplements until after surgery. Stop calcium, vitamin D, magnesium  You may however, continue to take Tylenol if needed for pain up until the day of surgery.  Continue taking all of your other prescription medications up until the day of surgery.  ON THE DAY OF SURGERY ONLY TAKE THESE MEDICATIONS WITH SIPS OF WATER:  Bisoprolol Carbidopa-levodopa  No Alcohol for 24 hours before or after surgery.  No Smoking including e-cigarettes for 24 hours before surgery.  No chewable tobacco products for at least 6 hours before surgery.  No nicotine patches on the day of surgery.  Do not use any "recreational" drugs for at least a week (preferably 2 weeks) before your surgery.  Please be advised that the combination of cocaine and anesthesia may have negative outcomes, up to and including  death. If you test positive for cocaine, your surgery will be cancelled.  On the morning of surgery brush your teeth with toothpaste and water, you may rinse your mouth with mouthwash if you wish. Do not swallow any toothpaste or mouthwash.  Use CHG Soap as directed on instruction sheet.  Do not wear jewelry, make-up, hairpins, clips or nail polish.  For welded (permanent) jewelry: bracelets, anklets, waist bands, etc.  Please have this removed prior to surgery.  If it is not removed, there is a chance that hospital personnel will need to cut it off on the day of surgery.  Do not wear lotions, powders, or perfumes.   Do not shave body hair from the neck down 48 hours before surgery.  Contact lenses, hearing aids and dentures may not be worn into surgery.  Do not bring valuables to the hospital. [facility] is not responsible for any missing/lost belongings or valuables.   Notify your doctor if there is any change in your medical condition (cold, fever, infection).  Wear comfortable clothing (specific to your surgery type) to the hospital.  After surgery, you can help prevent lung complications by doing breathing exercises.  Take deep breaths and cough every 1-2 hours. Your doctor may order a device called an Incentive Spirometer to help you take deep breaths. When coughing or sneezing, hold a pillow firmly against your incision with both hands. This is called "splinting." Doing this helps protect your incision. It also decreases belly discomfort.  If you are being admitted to the hospital overnight, leave your suitcase in the car. After surgery it may be brought to your room.  In case of increased patient census, it may be necessary for you, the patient, to continue your postoperative care in the Same Day Surgery department.  If you are being discharged the day of surgery, you will not be allowed to drive home. You will need a responsible individual to drive you home and stay with  you for 24 hours after surgery.   If you are taking public transportation, you will need to have a responsible individual with you.  Please call the Pre-admissions Testing Dept. at [phone] if you have any questions about these instructions.  Surgery Visitation Policy:  Patients having surgery or a procedure may have two visitors.  Children under the age of 4 must have an adult with them who is not the patient.  Inpatient Visitation:    Visiting hours are 7 a.m. to 8 p.m. Up to four visitors are allowed at one time in a patient room. The visitors may rotate out with other people during the day.  One visitor age 5 or older may stay with the patient overnight and must be in the room by 8 p.m.       Pre-operative 5 CHG Bath Instructions   You can play a key role in reducing the risk of infection after surgery. Your skin needs to be as free of germs as possible. You can reduce the number of germs on your skin by washing with CHG (chlorhexidine gluconate) soap before surgery. CHG is an antiseptic soap that kills germs and continues to kill germs even after washing.   DO NOT use if you have an allergy to chlorhexidine/CHG or antibacterial soaps. If your skin becomes reddened or irritated, stop using the CHG and notify one of our RNs at [phone].   Please shower with the CHG soap starting 4 days before surgery using the following schedule:     Please keep in mind the following:  DO NOT shave, including legs and underarms, starting the day of your first shower.   You may shave your face at any point before/day of surgery.  Place clean sheets on your bed the day you start using CHG soap. Use a clean washcloth (not used since being washed) for each shower. DO NOT sleep with pets once you start using the CHG.   CHG Shower Instructions:  If you choose to wash your hair and private area, wash first with your normal shampoo/soap.  After you use shampoo/soap, rinse your hair and  body thoroughly to remove shampoo/soap residue.  Turn the water OFF and apply about 3 tablespoons (45 ml) of CHG soap to a CLEAN washcloth.  Apply CHG soap ONLY FROM YOUR NECK DOWN TO YOUR TOES (washing for 3-5 minutes)  DO NOT use CHG soap on face, private areas, open wounds, or sores.  Pay special attention to the area where your surgery is being performed.  If you are having back surgery, having someone wash your back for you may be helpful. Wait 2 minutes after CHG soap is applied, then you may rinse off the CHG soap.  Pat dry with a clean towel  Put on clean clothes/pajamas   If you choose to wear lotion, please use ONLY the CHG-compatible lotions on the back of this paper.     Additional instructions for the day of surgery: DO NOT APPLY any lotions, deodorants, cologne, or  perfumes.   Put on clean/comfortable clothes.  Brush your teeth.  Ask your nurse before applying any prescription medications to the skin.      CHG Compatible Lotions   Aveeno Moisturizing lotion  Cetaphil Moisturizing Cream  Cetaphil Moisturizing Lotion  Clairol Herbal Essence Moisturizing Lotion, Dry Skin  Clairol Herbal Essence Moisturizing Lotion, Extra Dry Skin  Clairol Herbal Essence Moisturizing Lotion, Normal Skin  Curel Age Defying Therapeutic Moisturizing Lotion with Alpha Hydroxy  Curel Extreme Care Body Lotion  Curel Soothing Hands Moisturizing Hand Lotion  Curel Therapeutic Moisturizing Cream, Fragrance-Free  Curel Therapeutic Moisturizing Lotion, Fragrance-Free  Curel Therapeutic Moisturizing Lotion, Original Formula  Eucerin Daily Replenishing Lotion  Eucerin Dry Skin Therapy Plus Alpha Hydroxy Crme  Eucerin Dry Skin Therapy Plus Alpha Hydroxy Lotion  Eucerin Original Crme  Eucerin Original Lotion  Eucerin Plus Crme Eucerin Plus Lotion  Eucerin TriLipid Replenishing Lotion  Keri Anti-Bacterial Hand Lotion  Keri Deep Conditioning Original Lotion Dry Skin Formula Softly Scented   Keri Deep Conditioning Original Lotion, Fragrance Free Sensitive Skin Formula  Keri Lotion Fast Absorbing Fragrance Free Sensitive Skin Formula  Keri Lotion Fast Absorbing Softly Scented Dry Skin Formula  Keri Original Lotion  Keri Skin Renewal Lotion Keri Silky Smooth Lotion  Keri Silky Smooth Sensitive Skin Lotion  Nivea Body Creamy Conditioning Oil  Nivea Body Extra Enriched Teacher, adult education Moisturizing Lotion Nivea Crme  Nivea Skin Firming Lotion  NutraDerm 30 Skin Lotion  NutraDerm Skin Lotion  NutraDerm Therapeutic Skin Cream  NutraDerm Therapeutic Skin Lotion  ProShield Protective Hand Cream  Provon moisturizing lotion

## 2023-08-10 ENCOUNTER — Encounter
Admission: RE | Disposition: A | Discharge status: Home Health | Payer: Self-pay | Source: Home / Self Care | Attending: Neurosurgery

## 2023-08-10 ENCOUNTER — Inpatient Hospital Stay
Admission: RE | Admit: 2023-08-10 | Discharge: 2023-08-11 | DRG: 402 | Disposition: A | Discharge status: Home Health | Payer: Medicare HMO | Attending: Neurosurgery | Admitting: Neurosurgery

## 2023-08-10 ENCOUNTER — Other Ambulatory Visit: Payer: Self-pay

## 2023-08-10 ENCOUNTER — Inpatient Hospital Stay: Payer: Medicare HMO | Admitting: Urgent Care

## 2023-08-10 ENCOUNTER — Encounter: Payer: Self-pay | Admitting: Neurosurgery

## 2023-08-10 ENCOUNTER — Inpatient Hospital Stay: Payer: Medicare HMO | Admitting: General Practice

## 2023-08-10 ENCOUNTER — Inpatient Hospital Stay: Payer: Medicare HMO

## 2023-08-10 DIAGNOSIS — M5442 Lumbago with sciatica, left side: Secondary | ICD-10-CM | POA: Diagnosis present

## 2023-08-10 DIAGNOSIS — M4317 Spondylolisthesis, lumbosacral region: Secondary | ICD-10-CM | POA: Diagnosis present

## 2023-08-10 DIAGNOSIS — M810 Age-related osteoporosis without current pathological fracture: Secondary | ICD-10-CM | POA: Diagnosis present

## 2023-08-10 DIAGNOSIS — Z888 Allergy status to other drugs, medicaments and biological substances status: Secondary | ICD-10-CM

## 2023-08-10 DIAGNOSIS — M532X6 Spinal instabilities, lumbar region: Secondary | ICD-10-CM | POA: Diagnosis present

## 2023-08-10 DIAGNOSIS — M7138 Other bursal cyst, other site: Secondary | ICD-10-CM | POA: Diagnosis present

## 2023-08-10 DIAGNOSIS — Z79899 Other long term (current) drug therapy: Secondary | ICD-10-CM

## 2023-08-10 DIAGNOSIS — Z981 Arthrodesis status: Secondary | ICD-10-CM | POA: Diagnosis present

## 2023-08-10 DIAGNOSIS — E7800 Pure hypercholesterolemia, unspecified: Secondary | ICD-10-CM | POA: Diagnosis present

## 2023-08-10 DIAGNOSIS — R7303 Prediabetes: Secondary | ICD-10-CM | POA: Diagnosis present

## 2023-08-10 DIAGNOSIS — G8929 Other chronic pain: Secondary | ICD-10-CM | POA: Diagnosis present

## 2023-08-10 DIAGNOSIS — Z87440 Personal history of urinary (tract) infections: Secondary | ICD-10-CM | POA: Diagnosis not present

## 2023-08-10 DIAGNOSIS — Z803 Family history of malignant neoplasm of breast: Secondary | ICD-10-CM | POA: Diagnosis not present

## 2023-08-10 DIAGNOSIS — N189 Chronic kidney disease, unspecified: Secondary | ICD-10-CM | POA: Diagnosis present

## 2023-08-10 DIAGNOSIS — M4316 Spondylolisthesis, lumbar region: Secondary | ICD-10-CM

## 2023-08-10 DIAGNOSIS — G20A1 Parkinson's disease without dyskinesia, without mention of fluctuations: Secondary | ICD-10-CM | POA: Diagnosis present

## 2023-08-10 DIAGNOSIS — Z90710 Acquired absence of both cervix and uterus: Secondary | ICD-10-CM | POA: Diagnosis not present

## 2023-08-10 DIAGNOSIS — Z01818 Encounter for other preprocedural examination: Secondary | ICD-10-CM

## 2023-08-10 DIAGNOSIS — M1612 Unilateral primary osteoarthritis, left hip: Secondary | ICD-10-CM | POA: Diagnosis present

## 2023-08-10 HISTORY — PX: APPLICATION OF INTRAOPERATIVE CT SCAN: SHX6668

## 2023-08-10 HISTORY — PX: TRANSFORAMINAL LUMBAR INTERBODY FUSION W/ MIS 1 LEVEL: SHX6145

## 2023-08-10 LAB — ABO/RH: ABO/RH(D): A POS

## 2023-08-10 LAB — CREATININE, SERUM
Creatinine, Ser: 0.51 mg/dL (ref 0.44–1.00)
GFR, Estimated: >60 mL/min (ref >60)

## 2023-08-10 SURGERY — MINIMALLY INVASIVE (MIS) TRANSFORAMINAL LUMBAR INTERBODY FUSION (TLIF) 1 LEVEL
Anesthesia: General

## 2023-08-10 MED ORDER — ONDANSETRON HCL 4 MG/2ML IJ SOLN
4.0000 mg | Freq: Four times a day (QID) | INTRAMUSCULAR | Status: DC | PRN
  Administered 2023-08-10: 4 mg via INTRAVENOUS

## 2023-08-10 MED ORDER — FENTANYL CITRATE (PF) 100 MCG/2ML IJ SOLN
INTRAMUSCULAR | Status: DC | PRN
  Administered 2023-08-10: 50 ug via INTRAVENOUS
  Administered 2023-08-10: 100 ug via INTRAVENOUS
  Administered 2023-08-10: 50 ug via INTRAVENOUS

## 2023-08-10 MED ORDER — VITAMIN D3 25 MCG (1000 UNIT) PO TABS
2000.0000 [IU] | ORAL_TABLET | ORAL | Status: DC
  Administered 2023-08-11: 2000 [IU] via ORAL
  Filled 2023-08-10: qty 2

## 2023-08-10 MED ORDER — ACETAMINOPHEN 10 MG/ML IV SOLN
INTRAVENOUS | Status: AC
  Filled 2023-08-10: qty 100

## 2023-08-10 MED ORDER — SUCCINYLCHOLINE CHLORIDE 200 MG/10ML IV SOSY
PREFILLED_SYRINGE | INTRAVENOUS | Status: DC | PRN
  Administered 2023-08-10: 100 mg via INTRAVENOUS

## 2023-08-10 MED ORDER — FENTANYL CITRATE (PF) 100 MCG/2ML IJ SOLN
INTRAMUSCULAR | Status: AC
  Filled 2023-08-10: qty 2

## 2023-08-10 MED ORDER — MIDAZOLAM HCL 2 MG/2ML IJ SOLN
INTRAMUSCULAR | Status: AC
  Filled 2023-08-10: qty 2

## 2023-08-10 MED ORDER — METHOCARBAMOL 1000 MG/10ML IJ SOLN
500.0000 mg | Freq: Four times a day (QID) | INTRAMUSCULAR | Status: DC | PRN
  Administered 2023-08-10 (×2): 500 mg via INTRAVENOUS
  Filled 2023-08-10 (×3): qty 5

## 2023-08-10 MED ORDER — OXYCODONE HCL 5 MG PO TABS: 5.0000 mg | ORAL_TABLET | ORAL | Status: DC | PRN

## 2023-08-10 MED ORDER — ROSUVASTATIN CALCIUM 5 MG PO TABS
5.0000 mg | ORAL_TABLET | Freq: Every day | ORAL | Status: DC
  Administered 2023-08-10: 5 mg via ORAL
  Filled 2023-08-10 (×2): qty 1

## 2023-08-10 MED ORDER — DOCUSATE SODIUM 100 MG PO CAPS
100.0000 mg | ORAL_CAPSULE | Freq: Two times a day (BID) | ORAL | Status: DC
  Administered 2023-08-10 – 2023-08-11 (×2): 100 mg via ORAL

## 2023-08-10 MED ORDER — VANCOMYCIN HCL IN DEXTROSE 1-5 GM/200ML-% IV SOLN
1000.0000 mg | Freq: Once | INTRAVENOUS | Status: AC
  Administered 2023-08-10: 1000 mg via INTRAVENOUS

## 2023-08-10 MED ORDER — ONDANSETRON HCL 4 MG PO TABS
4.0000 mg | ORAL_TABLET | Freq: Four times a day (QID) | ORAL | Status: DC | PRN
  Administered 2023-08-11: 4 mg via ORAL

## 2023-08-10 MED ORDER — FLUTICASONE PROPIONATE 50 MCG/ACT NA SUSP: 2.0000 | Freq: Two times a day (BID) | NASAL | Status: DC | PRN

## 2023-08-10 MED ORDER — DOCUSATE SODIUM 100 MG PO CAPS
ORAL_CAPSULE | ORAL | Status: AC
  Filled 2023-08-10: qty 1

## 2023-08-10 MED ORDER — ORAL CARE MOUTH RINSE: 15.0000 mL | Freq: Once | OROMUCOSAL | Status: AC

## 2023-08-10 MED ORDER — ACETAMINOPHEN 500 MG PO TABS
ORAL_TABLET | ORAL | Status: AC
  Filled 2023-08-10: qty 2

## 2023-08-10 MED ORDER — METHOCARBAMOL 500 MG PO TABS
500.0000 mg | ORAL_TABLET | Freq: Four times a day (QID) | ORAL | Status: DC | PRN
  Administered 2023-08-11: 500 mg via ORAL

## 2023-08-10 MED ORDER — SUCCINYLCHOLINE CHLORIDE 200 MG/10ML IV SOSY
PREFILLED_SYRINGE | INTRAVENOUS | Status: AC
  Filled 2023-08-10: qty 10

## 2023-08-10 MED ORDER — CEFAZOLIN IN SODIUM CHLORIDE 2-0.9 GM/100ML-% IV SOLN
2.0000 g | Freq: Once | INTRAVENOUS | Status: AC
  Administered 2023-08-10: 2 g via INTRAVENOUS
  Filled 2023-08-10: qty 100

## 2023-08-10 MED ORDER — OXYCODONE HCL 5 MG PO TABS
ORAL_TABLET | ORAL | Status: AC
  Filled 2023-08-10: qty 2

## 2023-08-10 MED ORDER — CHLORHEXIDINE GLUCONATE 0.12 % MT SOLN
OROMUCOSAL | Status: AC
  Filled 2023-08-10: qty 15

## 2023-08-10 MED ORDER — KETOROLAC TROMETHAMINE 15 MG/ML IJ SOLN
INTRAMUSCULAR | Status: AC
  Filled 2023-08-10: qty 1

## 2023-08-10 MED ORDER — GABAPENTIN 300 MG PO CAPS
ORAL_CAPSULE | ORAL | Status: AC
  Filled 2023-08-10: qty 1

## 2023-08-10 MED ORDER — ACETAMINOPHEN 500 MG PO TABS
1000.0000 mg | ORAL_TABLET | Freq: Four times a day (QID) | ORAL | Status: DC
  Administered 2023-08-10 – 2023-08-11 (×3): 1000 mg via ORAL

## 2023-08-10 MED ORDER — BISOPROLOL FUMARATE 5 MG PO TABS
2.5000 mg | ORAL_TABLET | Freq: Every day | ORAL | Status: DC
  Administered 2023-08-11: 2.5 mg via ORAL
  Filled 2023-08-10 (×2): qty 0.5

## 2023-08-10 MED ORDER — MAGNESIUM CITRATE PO SOLN: 1.0000 | Freq: Once | ORAL | Status: DC | PRN

## 2023-08-10 MED ORDER — KETOROLAC TROMETHAMINE 15 MG/ML IJ SOLN
7.5000 mg | Freq: Four times a day (QID) | INTRAMUSCULAR | Status: DC
  Administered 2023-08-10 – 2023-08-11 (×3): 7.5 mg via INTRAVENOUS

## 2023-08-10 MED ORDER — SODIUM CHLORIDE (PF) 0.9 % IJ SOLN
INTRAMUSCULAR | Status: DC | PRN
  Administered 2023-08-10: 60 mL

## 2023-08-10 MED ORDER — OXYCODONE HCL 5 MG PO TABS
10.0000 mg | ORAL_TABLET | ORAL | Status: DC | PRN
Start: 2023-08-10 — End: 2023-08-11
  Administered 2023-08-10 (×2): 10 mg via ORAL

## 2023-08-10 MED ORDER — MIDAZOLAM HCL 2 MG/2ML IJ SOLN
INTRAMUSCULAR | Status: DC | PRN
  Administered 2023-08-10 (×2): 1 mg via INTRAVENOUS

## 2023-08-10 MED ORDER — LIDOCAINE HCL (PF) 2 % IJ SOLN
INTRAMUSCULAR | Status: AC
  Filled 2023-08-10: qty 5

## 2023-08-10 MED ORDER — EPHEDRINE SULFATE-NACL 50-0.9 MG/10ML-% IV SOSY
PREFILLED_SYRINGE | INTRAVENOUS | Status: DC | PRN
  Administered 2023-08-10 (×2): 5 mg via INTRAVENOUS

## 2023-08-10 MED ORDER — MENTHOL 3 MG MT LOZG: 1.0000 | LOZENGE | OROMUCOSAL | Status: DC | PRN

## 2023-08-10 MED ORDER — ONDANSETRON HCL 4 MG/2ML IJ SOLN
INTRAMUSCULAR | Status: DC | PRN
  Administered 2023-08-10: 4 mg via INTRAVENOUS

## 2023-08-10 MED ORDER — BUPIVACAINE-EPINEPHRINE (PF) 0.5% -1:200000 IJ SOLN
INTRAMUSCULAR | Status: DC | PRN
  Administered 2023-08-10: 8 mL

## 2023-08-10 MED ORDER — CARBIDOPA-LEVODOPA 25-100 MG PO TABS
1.0000 | ORAL_TABLET | Freq: Three times a day (TID) | ORAL | Status: DC
  Filled 2023-08-10: qty 1

## 2023-08-10 MED ORDER — CARBIDOPA-LEVODOPA 25-100 MG PO TABS
1.0000 | ORAL_TABLET | Freq: Three times a day (TID) | ORAL | Status: DC
  Administered 2023-08-10 – 2023-08-11 (×3): 1 via ORAL
  Filled 2023-08-10 (×4): qty 1

## 2023-08-10 MED ORDER — SENNA 8.6 MG PO TABS
1.0000 | ORAL_TABLET | Freq: Two times a day (BID) | ORAL | Status: DC
  Administered 2023-08-10 – 2023-08-11 (×2): 8.6 mg via ORAL

## 2023-08-10 MED ORDER — POLYETHYLENE GLYCOL 3350 17 G PO PACK: 17.0000 g | PACK | Freq: Every day | ORAL | Status: DC | PRN

## 2023-08-10 MED ORDER — ACETAMINOPHEN 500 MG PO TABS: 1000.0000 mg | ORAL_TABLET | Freq: Four times a day (QID) | ORAL | Status: DC

## 2023-08-10 MED ORDER — MORPHINE SULFATE (PF) 4 MG/ML IV SOLN
INTRAVENOUS | Status: AC
  Filled 2023-08-10: qty 1

## 2023-08-10 MED ORDER — OXYCODONE HCL 5 MG/5ML PO SOLN: 5.0000 mg | Freq: Once | ORAL | Status: DC | PRN

## 2023-08-10 MED ORDER — PHENYLEPHRINE HCL (PRESSORS) 10 MG/ML IV SOLN
INTRAVENOUS | Status: AC
  Filled 2023-08-10: qty 1

## 2023-08-10 MED ORDER — VANCOMYCIN HCL IN DEXTROSE 1-5 GM/200ML-% IV SOLN
INTRAVENOUS | Status: AC
  Filled 2023-08-10: qty 200

## 2023-08-10 MED ORDER — ENOXAPARIN SODIUM 40 MG/0.4ML IJ SOSY
40.0000 mg | PREFILLED_SYRINGE | INTRAMUSCULAR | Status: DC
  Administered 2023-08-11: 40 mg via SUBCUTANEOUS

## 2023-08-10 MED ORDER — DEXAMETHASONE SODIUM PHOSPHATE 10 MG/ML IJ SOLN
INTRAMUSCULAR | Status: AC
  Filled 2023-08-10: qty 1

## 2023-08-10 MED ORDER — PROPOFOL 10 MG/ML IV BOLUS
INTRAVENOUS | Status: AC
  Filled 2023-08-10: qty 20

## 2023-08-10 MED ORDER — ACETAMINOPHEN 325 MG PO TABS: 650.0000 mg | ORAL_TABLET | ORAL | Status: DC | PRN

## 2023-08-10 MED ORDER — CHLORHEXIDINE GLUCONATE 0.12 % MT SOLN
15.0000 mL | Freq: Once | OROMUCOSAL | Status: AC
  Administered 2023-08-10: 15 mL via OROMUCOSAL

## 2023-08-10 MED ORDER — SORBITOL 70 % SOLN: 30.0000 mL | Freq: Every day | Status: DC | PRN

## 2023-08-10 MED ORDER — ACETAMINOPHEN 650 MG RE SUPP: 650.0000 mg | RECTAL | Status: DC | PRN

## 2023-08-10 MED ORDER — CEFAZOLIN SODIUM-DEXTROSE 2-4 GM/100ML-% IV SOLN
INTRAVENOUS | Status: AC
  Filled 2023-08-10: qty 100

## 2023-08-10 MED ORDER — PHENYLEPHRINE 80 MCG/ML (10ML) SYRINGE FOR IV PUSH (FOR BLOOD PRESSURE SUPPORT)
PREFILLED_SYRINGE | INTRAVENOUS | Status: AC
  Filled 2023-08-10: qty 10

## 2023-08-10 MED ORDER — LIDOCAINE HCL (CARDIAC) PF 100 MG/5ML IV SOSY
PREFILLED_SYRINGE | INTRAVENOUS | Status: DC | PRN
  Administered 2023-08-10: 80 mg via INTRAVENOUS

## 2023-08-10 MED ORDER — LACTATED RINGERS IV SOLN: INTRAVENOUS | Status: DC

## 2023-08-10 MED ORDER — PHENYLEPHRINE HCL-NACL 20-0.9 MG/250ML-% IV SOLN
INTRAVENOUS | Status: DC | PRN
  Administered 2023-08-10: 30 ug/min via INTRAVENOUS

## 2023-08-10 MED ORDER — GABAPENTIN 300 MG PO CAPS
300.0000 mg | ORAL_CAPSULE | Freq: Every day | ORAL | Status: DC
  Administered 2023-08-10: 300 mg via ORAL

## 2023-08-10 MED ORDER — SENNA 8.6 MG PO TABS
ORAL_TABLET | ORAL | Status: AC
  Filled 2023-08-10: qty 1

## 2023-08-10 MED ORDER — ONDANSETRON HCL 4 MG/2ML IJ SOLN
INTRAMUSCULAR | Status: AC
  Filled 2023-08-10: qty 2

## 2023-08-10 MED ORDER — SURGIFLO WITH THROMBIN (HEMOSTATIC MATRIX KIT) OPTIME
TOPICAL | Status: DC | PRN
  Administered 2023-08-10: 1 via TOPICAL

## 2023-08-10 MED ORDER — ORAL CARE MOUTH RINSE
15.0000 mL | OROMUCOSAL | Status: DC | PRN
Start: 2023-08-10 — End: 2023-08-11

## 2023-08-10 MED ORDER — FENTANYL CITRATE (PF) 100 MCG/2ML IJ SOLN
25.0000 ug | INTRAMUSCULAR | Status: DC | PRN
  Administered 2023-08-10 (×2): 25 ug via INTRAVENOUS

## 2023-08-10 MED ORDER — PHENOL 1.4 % MT LIQD: 1.0000 | OROMUCOSAL | Status: DC | PRN

## 2023-08-10 MED ORDER — SODIUM CHLORIDE 0.9% FLUSH: 3.0000 mL | INTRAVENOUS | Status: DC | PRN

## 2023-08-10 MED ORDER — MORPHINE SULFATE (PF) 2 MG/ML IV SOLN
2.0000 mg | INTRAVENOUS | Status: AC | PRN
  Administered 2023-08-10: 2 mg via INTRAVENOUS

## 2023-08-10 MED ORDER — 0.9 % SODIUM CHLORIDE (POUR BTL) OPTIME
TOPICAL | Status: DC | PRN
  Administered 2023-08-10: 300 mL

## 2023-08-10 MED ORDER — LORATADINE 10 MG PO TABS
ORAL_TABLET | ORAL | Status: AC
  Filled 2023-08-10: qty 1

## 2023-08-10 MED ORDER — PROPOFOL 10 MG/ML IV BOLUS
INTRAVENOUS | Status: DC | PRN
  Administered 2023-08-10: 150 mg via INTRAVENOUS

## 2023-08-10 MED ORDER — OXYCODONE HCL 5 MG PO TABS: 5.0000 mg | ORAL_TABLET | Freq: Once | ORAL | Status: DC | PRN

## 2023-08-10 MED ORDER — REMIFENTANIL HCL 1 MG IV SOLR
INTRAVENOUS | Status: DC | PRN
  Administered 2023-08-10: .1 ug/kg/min via INTRAVENOUS

## 2023-08-10 MED ORDER — PHENYLEPHRINE HCL-NACL 20-0.9 MG/250ML-% IV SOLN
INTRAVENOUS | Status: AC
  Filled 2023-08-10: qty 250

## 2023-08-10 MED ORDER — ACETAMINOPHEN 10 MG/ML IV SOLN
INTRAVENOUS | Status: DC | PRN
  Administered 2023-08-10: 1000 mg via INTRAVENOUS

## 2023-08-10 MED ORDER — REMIFENTANIL HCL 1 MG IV SOLR
INTRAVENOUS | Status: AC
  Filled 2023-08-10: qty 1000

## 2023-08-10 MED ORDER — LORATADINE 10 MG PO TABS
10.0000 mg | ORAL_TABLET | Freq: Every evening | ORAL | Status: DC
  Administered 2023-08-10: 10 mg via ORAL

## 2023-08-10 MED ORDER — DEXAMETHASONE SODIUM PHOSPHATE 10 MG/ML IJ SOLN
INTRAMUSCULAR | Status: DC | PRN
  Administered 2023-08-10: 4 mg via INTRAVENOUS

## 2023-08-10 MED ORDER — EPHEDRINE 5 MG/ML INJ
INTRAVENOUS | Status: AC
  Filled 2023-08-10: qty 5

## 2023-08-10 SURGICAL SUPPLY — 66 items
ADH SKN CLS APL DERMABOND .7 (GAUZE/BANDAGES/DRESSINGS) ×2
AGENT HMST KT MTR STRL THRMB (HEMOSTASIS) ×2
ALLOGRAFT BONESTRIP KORE 2.5X5 (Bone Implant) IMPLANT
APL PRP STRL LF DISP 70% ISPRP (MISCELLANEOUS) ×2
BASIN KIT SINGLE STR (MISCELLANEOUS) ×2 IMPLANT
BUR NEURO DRILL SOFT 3.0X3.8M (BURR) ×2 IMPLANT
CEMENT MAX VISCOSITY NUVA (Cement) IMPLANT
CHLORAPREP W/TINT 26 (MISCELLANEOUS) ×2 IMPLANT
DERMABOND ADVANCED .7 DNX12 (GAUZE/BANDAGES/DRESSINGS) ×2 IMPLANT
DRAPE C-ARMOR (DRAPES) IMPLANT
DRAPE LAPAROTOMY 100X77 ABD (DRAPES) ×2 IMPLANT
DRAPE MICROSCOPE SPINE 48X150 (DRAPES) IMPLANT
DRAPE SCAN PATIENT (DRAPES) ×2 IMPLANT
DRSG OPSITE POSTOP 4X8 (GAUZE/BANDAGES/DRESSINGS) IMPLANT
DRSG TEGADERM 4X4.75 (GAUZE/BANDAGES/DRESSINGS) IMPLANT
ELECT EZSTD 165MM 6.5IN (MISCELLANEOUS)
ELECT REM PT RETURN 9FT ADLT (ELECTROSURGICAL) ×2
ELECTRODE EZSTD 165MM 6.5IN (MISCELLANEOUS) IMPLANT
ELECTRODE REM PT RTRN 9FT ADLT (ELECTROSURGICAL) ×2 IMPLANT
EVACUATOR 1/8 PVC DRAIN (DRAIN) IMPLANT
EX-PIN ORTHOLOCK NAV 4X150 (PIN) IMPLANT
GAUZE 4X4 16PLY ~~LOC~~+RFID DBL (SPONGE) ×2 IMPLANT
GAUZE SPONGE 2X2 STRL 8-PLY (GAUZE/BANDAGES/DRESSINGS) ×2 IMPLANT
GLOVE BIOGEL PI IND STRL 6.5 (GLOVE) ×4 IMPLANT
GLOVE SURG SYN 6.5 ES PF (GLOVE) ×12 IMPLANT
GLOVE SURG SYN 6.5 PF PI (GLOVE) ×4 IMPLANT
GLOVE SURG SYN 8.5 E (GLOVE) ×8 IMPLANT
GLOVE SURG SYN 8.5 PF PI (GLOVE) ×8 IMPLANT
GOWN SRG LRG LVL 4 IMPRV REINF (GOWNS) ×6 IMPLANT
GOWN SRG XL LVL 3 NONREINFORCE (GOWNS) ×2 IMPLANT
GOWN STRL NON-REIN TWL XL LVL3 (GOWNS) ×2
GOWN STRL REIN LRG LVL4 (GOWNS) ×6
GUIDEWIRE NITINOL BEVEL TIP (WIRE) IMPLANT
HOLDER FOLEY CATH W/STRAP (MISCELLANEOUS) IMPLANT
INTERBODY SABLE 10X26 7-14 15D (Miscellaneous) IMPLANT
JET LAVAGE IRRISEPT WOUND (IRRIGATION / IRRIGATOR) ×2
KIT PREVENA INCISION MGT 13 (CANNISTER) IMPLANT
KIT SPINAL PRONEVIEW (KITS) ×2 IMPLANT
KNIFE BAYONET SHORT DISCETOMY (MISCELLANEOUS) IMPLANT
LAVAGE JET IRRISEPT WOUND (IRRIGATION / IRRIGATOR) ×2 IMPLANT
MANIFOLD NEPTUNE II (INSTRUMENTS) ×2 IMPLANT
MARKER SKIN DUAL TIP RULER LAB (MISCELLANEOUS) ×2 IMPLANT
MARKER SPHERE PSV REFLC 13MM (MARKER) ×14 IMPLANT
NDL AND PUSHER RELINE MAS (NEEDLE) IMPLANT
NDL SAFETY ECLIPSE 18X1.5 (NEEDLE) ×2 IMPLANT
NS IRRIG 1000ML POUR BTL (IV SOLUTION) ×2 IMPLANT
PACK LAMINECTOMY ARMC (PACKS) ×2 IMPLANT
ROD RELINE MAS LORD 5.5X45MM (Rod) IMPLANT
Reline mas guide fen IMPLANT
SCREW LOCK RELINE 5.5 TULIP (Screw) IMPLANT
SCREW RELINE PA 6.5X45 (Screw) IMPLANT
SURGIFLO W/THROMBIN 8M KIT (HEMOSTASIS) ×2 IMPLANT
SUT ETHILON 3-0 FS-10 30 BLK (SUTURE) ×2
SUT STRATA 3-0 15 PS-2 (SUTURE) IMPLANT
SUT VIC AB 0 CT1 27 (SUTURE) ×2
SUT VIC AB 0 CT1 27XCR 8 STRN (SUTURE) ×2 IMPLANT
SUT VIC AB 2-0 CT1 18 (SUTURE) ×2 IMPLANT
SUTURE EHLN 3-0 FS-10 30 BLK (SUTURE) IMPLANT
SYR 10ML LL (SYRINGE) ×2 IMPLANT
SYR 30ML LL (SYRINGE) ×4 IMPLANT
SYS RELINE MAS CMNT GUIDE QC (NEEDLE) IMPLANT
TIP FENS REPLACE RELINE (MISCELLANEOUS) IMPLANT
TOWEL OR 17X26 4PK STRL BLUE (TOWEL DISPOSABLE) ×2 IMPLANT
TRAP FLUID SMOKE EVACUATOR (MISCELLANEOUS) ×2 IMPLANT
TRAY FOLEY SLVR 16FR LF STAT (SET/KITS/TRAYS/PACK) IMPLANT
WATER STERILE IRR 1000ML POUR (IV SOLUTION) ×2 IMPLANT

## 2023-08-10 NOTE — Op Note (Signed)
Indications: 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 The patient tried and failed conservative management, prompting surgical intervention.  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   Preoperative Note:   Risks of surgery discussed include: infection, bleeding, stroke, coma, death, paralysis, CSF leak, nerve/spinal cord injury, numbness, tingling, weakness, complex regional pain syndrome, recurrent stenosis and/or disc herniation, vascular injury, development of instability, neck/back pain, need for further surgery, persistent symptoms, development of deformity, and the risks of anesthesia. The patient understood these risks and agreed to proceed.  Operative Note:  1. Transforaminal Lumbar Interbody Fusion L5/S1 2. Posterolateral arthrodesis L5 to S1 3. Posterior nonsegmental instrumentation L5 to S1 using Nuvasive Reline 4. Use of stereotaxis ([employer]) 5. Harvesting of autograft via the same incision 6. Placement of a biomechanical device (Globus Sable) at L5/S1 for anterior arthrodesis  The patient was brought to the Operating Room, intubated and turned into the prone position. All pressure points were checked and double checked. The patient was prepped and draped in the standard fashion. A full timeout was performed. Preoperative antibiotics were given.   After draping, the stereotactic array was placed.  Stereotactic imaging was acquired and registered to the [employer] system.  The image guidance  system was used to choose bilateral [place] incisions. The incisions were injected with local anesthetic.  Each incision was opened with a scalpel, bovie electrocautery was used to open the fascia.  Using stereotactic guidance, each pedicle from L5-LS1was cannulated and K wire secured.  We then placed pedicle screws over each K wire on the contralateral side.  We placed the ipsilateral pedicle screws after the TLIF procedure was performed.  6.5x60mm Nuvasive Reline screws were used bilaterally at each level.   After placement of pedicle screws, we then turned our attention to transforaminal lumbar interbody fusion.  A tubular retractor was advanced over the left L5/S1 facet. The microscope was brought into the field.  The left L5/S1 facet was removed with osteotomes and the drill, and handed off for preparation as autograft. The traversing and exiting nerve roots on the left were identified and protected. The disc was opened using a scalpel. After incising the disc space, we took a combination of pituitary rongeurs, Kerrison rongeurs, disc scrapers, and curettes to remove a majority of the disc material.  We prepared the end plates for accepting the interbody fusion.  We removed the cartilaginous plate, preserved the cortical endplate if possible during this procedure.  The disc space was irrigated.  The Globus Sable biomechanical device was inserted, then backfilled with a mixture of allograft and autograft. During placement, the nerve roots and dural sac were carefully protected without any leaks identified. After placement of the device, the canal was fully inspected and all nerve roots were checked to ensure full decompression.  The retractor was removed.  The stereotactic imaging system was brought back into the field and placement of screws were confirmed.  Due to close proximity of the fenestrations in the screws to the cortex on the left side, we made the intraoperative decision to cement augment the  right side only.  This was performed.  Rods measured and placed. The rods were secured using locking caps to manufacturer's specifications. The wound was copiously irrigated, then the posterior elements were prepared for arthrodesis.  A mixture of allograft and autograft was placed over the decorticated surfaces for arthrodesis.   A subfascial drain was placed on the left.  After hemostasis, the wound was closed in layers with 0 and 2-0 vicryl. 3-0 monocryl and dermabond was applied to the incision. A sterile dressing was placed.  The patient was then flipped supine and extubated with incident. All counts were correct times 2 at the end of the case. No immediate complications were noted.  Manning Charity PA assisted in the entire procedure. An assistant was required for this procedure due to the complexity.  The assistant provided assistance in tissue manipulation and suction, and was required for the successful and safe performance of the procedure. I performed the critical portions of the procedure.   Venetia Night MD

## 2023-08-10 NOTE — Anesthesia Preprocedure Evaluation (Signed)
Anesthesia Evaluation  Patient identified by MRN, date of birth, ID band Patient awake    Reviewed: Allergy & Precautions, H&P , NPO status , Patient's Chart, lab work & pertinent test results, reviewed documented beta blocker date and time   Airway Mallampati: II   Neck ROM: full    Dental  (+) Poor Dentition, Dental Advidsory Given   Pulmonary neg pulmonary ROS   Pulmonary exam normal        Cardiovascular Normal cardiovascular exam+ dysrhythmias  Rhythm:regular Rate:Normal     Neuro/Psych  Neuromuscular disease  negative psych ROS   GI/Hepatic negative GI ROS, Neg liver ROS,,,  Endo/Other  negative endocrine ROS    Renal/GU Renal disease     Musculoskeletal   Abdominal   Peds  Hematology negative hematology ROS (+)   Anesthesia Other Findings Patient with frequent PVCs. Patient is seeing cardiologist and is on a beta blocker. States it doesn't affect her daily life. No other signs or symptoms.  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 No date: Arthritis No date: Bladder polyps No date: Dysrhythmia     Comment:  PVC's No date: Fibrocystic breast disease No date: Frequent UTI No date: H/O degenerative disc disease No date: Hypercholesterolemia No date: Osteoporosis Past Surgical History: No date: ABDOMINAL HYSTERECTOMY No date: BLADDER FULGURATION No date: COLONOSCOPY 07/12/2018: COLONOSCOPY WITH PROPOFOL; N/A     Comment:  Procedure: COLONOSCOPY WITH PROPOFOL;  Surgeon: [place],               Boykin Nearing, MD;  Location: [facility] ENDOSCOPY;  Service:               Gastroenterology;  Laterality: N/A; No date: DILATION AND CURETTAGE OF UTERUS No date: OVARIAN CYST REMOVAL No date: TONSILLECTOMY AND ADENOIDECTOMY BMI    Body Mass Index: 24.86 kg/m     Reproductive/Obstetrics negative OB ROS                             Anesthesia Physical Anesthesia Plan  ASA:  3  Anesthesia Plan: General ETT and General   Post-op Pain Management:    Induction: Intravenous  PONV Risk Score and Plan: 3 and Ondansetron, Midazolam and Dexamethasone  Airway Management Planned: Oral ETT  Additional Equipment:   Intra-op Plan:   Post-operative Plan: Extubation in OR  Informed Consent: I have reviewed the patients History and Physical, chart, labs and discussed the procedure including the risks, benefits and alternatives for the proposed anesthesia with the patient or authorized representative who has indicated his/her understanding and acceptance.     Dental Advisory Given  Plan Discussed with: Anesthesiologist, CRNA and Surgeon  Anesthesia Plan Comments: (Patient consented for risks of anesthesia including but not limited to:  - adverse reactions to medications - damage to eyes, teeth, lips or other oral mucosa - nerve damage due to positioning  - sore throat or hoarseness - Damage to heart, brain, nerves, lungs, other parts of body or loss of life  Patient voiced understanding and assent.)       Anesthesia Quick Evaluation

## 2023-08-10 NOTE — Progress Notes (Signed)
Patient awake/alert x4, Moving all ext without event, pain LLE but states: it feels  Better. Tolerating po fluids/crackers without event.  Family updated.  Indwelling foley catheter patent.

## 2023-08-10 NOTE — Interval H&P Note (Signed)
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  The various methods of treatment have been discussed with the patient and family. After consideration of risks, benefits and other options for treatment, the patient has consented to  Procedure(s): LEFT L5-S1 MINIMALLY INVASIVE (MIS) TRANSFORAMINAL LUMBAR INTERBODY FUSION (TLIF) (Left) APPLICATION OF INTRAOPERATIVE CT SCAN (N/A) as a surgical intervention.  The patient's history has been reviewed, patient examined, no change in status, stable for surgery.  I have reviewed the patient's chart and labs.  Questions were answered to the patient's satisfaction.    Heart sounds normal no MRG. Chest Clear to Auscultation Bilaterally.   Damacio Weisgerber

## 2023-08-10 NOTE — Plan of Care (Signed)
  Problem: Pain Management: Goal: General experience of comfort will improve Outcome: Progressing

## 2023-08-10 NOTE — Transfer of Care (Signed)
Immediate Anesthesia Transfer of Care Note  Patient: Lindsay Fields  Procedure(s) Performed: LEFT L5-S1 MINIMALLY INVASIVE (MIS) TRANSFORAMINAL LUMBAR INTERBODY FUSION (TLIF) (Left) APPLICATION OF INTRAOPERATIVE CT SCAN  Patient Location: PACU  Anesthesia Type:General  Level of Consciousness: awake, drowsy, and patient cooperative  Airway & Oxygen Therapy: Patient Spontanous Breathing and Patient connected to face mask oxygen  Post-op Assessment: Report given to RN and Post -op Vital signs reviewed and stable  Post vital signs: Reviewed and stable  Last Vitals:  Vitals Value Taken Time  BP 147/80 08/10/23 1553  Temp    Pulse 74 08/10/23 1554  Resp 21 08/10/23 1554  SpO2 100 % 08/10/23 1554  Vitals shown include unfiled device data.  Last Pain:  Vitals:   08/10/23 1019  TempSrc: Tympanic  PainSc: 4          Complications: No notable events documented.

## 2023-08-10 NOTE — Discharge Instructions (Signed)
  Your surgeon has performed an operation on your lumbar spine (low back) to relieve pressure on one or more nerves. Many times, patients feel better immediately after surgery and can "overdo it." Even if you feel well, it is important that you follow these activity guidelines. If you do not let your back heal properly from the surgery, you can increase the chance of hardware complications and/or return of your symptoms. The following are instructions to help in your recovery once you have been discharged from the hospital.  Do not use NSAIDs for 3 months after surgery.   Activity    No bending, lifting, or twisting ("BLT"). Avoid lifting objects heavier than 10 pounds (gallon milk jug).  Where possible, avoid household activities that involve lifting, bending, pushing, or pulling such as laundry, vacuuming, grocery shopping, and childcare. Try to arrange for help from friends and family for these activities while your back heals.  Increase physical activity slowly as tolerated.  Taking short walks is encouraged, but avoid strenuous exercise. Do not jog, run, bicycle, lift weights, or participate in any other exercises unless specifically allowed by your doctor. Avoid prolonged sitting, including car rides.  Talk to your doctor before resuming sexual activity.  You should not drive until cleared by your doctor.  Until released by your doctor, you should not return to work or school.  You should rest at home and let your body heal.   You may shower three days after your surgery.  After showering, lightly dab your incision dry. Do not take a tub bath or go swimming for 3 weeks, or until approved by your doctor at your follow-up appointment.  If you smoke, we strongly recommend that you quit.  Smoking has been proven to interfere with normal healing in your back and will dramatically reduce the success rate of your surgery. Please contact [facility] (800-QUIT-NOW) and use the resources at  www.[facility].com for assistance in stopping smoking.  Surgical Incision   If you have a dressing on your incision, you may remove it three days after your surgery. Keep your incision area clean and dry.  Your incision was closed with Dermabond glue. The glue should begin to peel away within about a week.  Diet            You may return to your usual diet. Be sure to stay hydrated.  When to Contact us  Although your surgery and recovery will likely be uneventful, you may have some residual numbness, aches, and pains in your back and/or legs. This is normal and should improve in the next few weeks.  However, should you experience any of the following, contact us immediately: New numbness or weakness Pain that is progressively getting worse, and is not relieved by your pain medications or rest Bleeding, redness, swelling, pain, or drainage from surgical incision Chills or flu-like symptoms Fever greater than 101.0 F (38.3 C) Problems with bowel or bladder functions Difficulty breathing or shortness of breath Warmth, tenderness, or swelling in your calf  Contact Information How to contact us:  If you have any questions/concerns before or after surgery, you can reach [place] at [phone], or you can send a mychart message. We can be reached by phone or mychart 8am-4pm, Monday-Friday.  *Please note: Calls after 4pm are forwarded to a third party answering service. Mychart messages are not routinely monitored during evenings, weekends, and holidays. Please call our office to contact the answering service for urgent concerns during non-business hours.

## 2023-08-10 NOTE — Plan of Care (Signed)
Reviewed all above with patient, verbalizes understanidng

## 2023-08-11 MED ORDER — OXYCODONE HCL 5 MG PO TABS: 5.0000 mg | ORAL_TABLET | ORAL | 0 refills | Status: DC | PRN

## 2023-08-11 MED ORDER — ACETAMINOPHEN 500 MG PO TABS
ORAL_TABLET | ORAL | Status: AC
Start: 2023-08-11 — End: ?
  Filled 2023-08-11: qty 2

## 2023-08-11 MED ORDER — METHOCARBAMOL 500 MG PO TABS
ORAL_TABLET | ORAL | Status: AC
  Filled 2023-08-11: qty 1

## 2023-08-11 MED ORDER — KETOROLAC TROMETHAMINE 15 MG/ML IJ SOLN
INTRAMUSCULAR | Status: AC
  Filled 2023-08-11: qty 1

## 2023-08-11 MED ORDER — DOCUSATE SODIUM 100 MG PO CAPS
ORAL_CAPSULE | ORAL | Status: AC
Start: 2023-08-11 — End: ?
  Filled 2023-08-11: qty 1

## 2023-08-11 MED ORDER — ACETAMINOPHEN 500 MG PO TABS
ORAL_TABLET | ORAL | Status: AC
  Filled 2023-08-11: qty 2

## 2023-08-11 MED ORDER — ENOXAPARIN SODIUM 40 MG/0.4ML IJ SOSY
PREFILLED_SYRINGE | INTRAMUSCULAR | Status: AC
Start: 2023-08-11 — End: ?
  Filled 2023-08-11: qty 0.4

## 2023-08-11 MED ORDER — SENNA 8.6 MG PO TABS: 1.0000 | ORAL_TABLET | Freq: Every day | ORAL | 0 refills | Status: AC | PRN

## 2023-08-11 MED ORDER — SENNA 8.6 MG PO TABS
ORAL_TABLET | ORAL | Status: AC
  Filled 2023-08-11: qty 1

## 2023-08-11 MED ORDER — ONDANSETRON HCL 4 MG PO TABS
ORAL_TABLET | ORAL | Status: AC
  Filled 2023-08-11: qty 1

## 2023-08-11 MED ORDER — METHOCARBAMOL 500 MG PO TABS: 500.0000 mg | ORAL_TABLET | Freq: Three times a day (TID) | ORAL | 0 refills | Status: DC | PRN

## 2023-08-11 MED ORDER — VITAMIN D 25 MCG (1000 UNIT) PO TABS
ORAL_TABLET | ORAL | Status: AC
Start: 2023-08-11 — End: ?
  Filled 2023-08-11: qty 2

## 2023-08-11 NOTE — Evaluation (Signed)
Occupational Therapy Evaluation Patient Details Name: Lindsay Fields MRN: [id number] DOB: Feb 20, 1951 Today's Date: 08/11/2023   History of Present Illness Patient is a 72 year old female with Spondylolisthesis, spinal instability, chronic low back pain with left side sciatica. S/p TLIF L5-S1.   Clinical Impression   Pt was seen for OT evaluation this date. Prior to hospital admission, pt was independent with all I/ADL's. Pt lives with husband. Upon arrival to room pt supine in bed with NT present emptying Hemovac. Pt agreeable to Tx. Pt completed Sidelying<>Sit at the EOB with supervision. Pt educated and verbalized understanding of back precautions. Pt completed lower body dressing (socks, underwear, pants and shoes) utilizing figure 4 position and going from sit<>stand with supervision. Pt able to do static standing without AD, no LOB noted. Pt returned to supine in bed with call bell within reach and all needs met. OT signing off. Do not anticipate the need for follow up OT services upon acute hospital DC.      If plan is discharge home, recommend the following: Assistance with cooking/housework;Help with stairs or ramp for entrance;Assist for transportation;A little help with bathing/dressing/bathroom    Functional Status Assessment  Patient has had a recent decline in their functional status and demonstrates the ability to make significant improvements in function in a reasonable and predictable amount of time.  Equipment Recommendations  BSC/3in1    Recommendations for Other Services       Precautions / Restrictions Precautions Precautions: Back;Fall Precaution Booklet Issued: No Required Braces or Orthoses: Spinal Brace Spinal Brace: Lumbar corset Restrictions Weight Bearing Restrictions: No      Mobility Bed Mobility Overal bed mobility: Needs Assistance Bed Mobility: Sidelying to Sit, Sit to Sidelying   Sidelying to sit: Supervision     Sit to sidelying: HOB  elevated, Supervision   Patient Response: Cooperative  Transfers Overall transfer level: Needs assistance Equipment used: Rolling walker (2 wheels) Transfers: Sit to/from Stand Sit to Stand: Supervision                  Balance Overall balance assessment: Needs assistance Sitting-balance support: Feet supported Sitting balance-Leahy Scale: Good     Standing balance support: No upper extremity supported Standing balance-Leahy Scale: Fair                             ADL either performed or assessed with clinical judgement   ADL Overall ADL's : Needs assistance/impaired                                       General ADL Comments: Pt completed lower body dressing (socks, underwear, pants and shoes) utilizing figure 4 position with supervision from sit<>stand with supervision. Pt able to static stand without AD, no LOB noted.     Vision Patient Visual Report: No change from baseline       Perception         Praxis         Pertinent Vitals/Pain Pain Assessment Pain Assessment: Faces Faces Pain Scale: Hurts little more Pain Location: lower back Pain Descriptors / Indicators: Discomfort Pain Intervention(s): Limited activity within patient's tolerance, Monitored during session, Repositioned     Extremity/Trunk Assessment Upper Extremity Assessment Upper Extremity Assessment: Overall WFL for tasks assessed   Lower Extremity Assessment Lower Extremity Assessment: Overall WFL for tasks assessed  Cervical / Trunk Assessment Cervical / Trunk Assessment: Back Surgery   Communication Communication Communication: No apparent difficulties   Cognition Arousal: Alert Behavior During Therapy: WFL for tasks assessed/performed Overall Cognitive Status: Within Functional Limits for tasks assessed                                       General Comments  patient educated on logroll technique and general spine precuations.  reviewed how to donn/doff LSO which was on during mobility today. mild nausea initially with moving around that subsides with increased activity. no dizziness is reported with mobility    Exercises     Shoulder Instructions      Home Living Family/patient expects to be discharged to:: Private residence Living Arrangements: Spouse/significant other Available Help at Discharge: Family;Available 24 hours/day Type of Home: House Home Access: Stairs to enter [employer] of Steps: 3 Entrance Stairs-Rails: Right;Left;Can reach both Home Layout: Two level;Able to live on main level with bedroom/bathroom     Bathroom Shower/Tub: Producer, television/film/video: Handicapped height Bathroom Accessibility: Yes   Home Equipment: Hand held shower head;Grab bars - toilet;Grab bars - tub/shower;Shower seat;Toilet riser          Prior Functioning/Environment Prior Level of Function : Independent/Modified Independent               ADLs Comments: Independent with all I/ADL's.        OT Problem List:        OT Treatment/Interventions:      OT Goals(Current goals can be found in the care plan section) Acute Rehab OT Goals Patient Stated Goal: to go home. OT Goal Formulation: With patient Time For Goal Achievement: 08/25/23 Potential to Achieve Goals: Good  OT Frequency:      Co-evaluation              AM-PAC OT "6 Clicks" Daily Activity     Outcome Measure Help from another person eating meals?: None Help from another person taking care of personal grooming?: A Little Help from another person toileting, which includes using toliet, bedpan, or urinal?: A Little Help from another person bathing (including washing, rinsing, drying)?: A Little Help from another person to put on and taking off regular upper body clothing?: None Help from another person to put on and taking off regular lower body clothing?: A Little 6 Click Score: 20   End of Session     Activity Tolerance: Patient tolerated treatment well Patient left: in bed;with call bell/phone within reach                   Time: 1152-1210 OT Time Calculation (min): 18 min Charges:     Butch Penny, SOT

## 2023-08-11 NOTE — Evaluation (Signed)
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 S/p TLIF L5-S1.   Clinical Impression  Patient is agreeable to PT evaluation. She was independent with ambulation prior to surgery but with limited standing tolerance secondary to pain. She lives with supportive spouse and has some DME in the home already.   Today the patient is eager to mobilize and is hopeful for discharge home today. Educated patient on logroll technique and general spine precautions. Patient had one bout of nausea initially with mobilizing that subsided quickly. Patient required only supervision for transfers from various surfaces and for hallway ambulation. Patient walked 2 laps around the nursing station using rolling walker for support. Education provided on donning/doffing brace. She reports pain is improved with mobility. Supportive family present throughout. Mobility is adequate for discharge home with family support. PT will follow up tomorrow if patient is still here.       If plan is discharge home, recommend the following: Assist for transportation     Equipment Recommendations Rolling walker (2 wheels)     Functional Status Assessment Patient has had a recent decline in their functional status and demonstrates the ability to make significant improvements in function in a reasonable and predictable amount of time.     Precautions / Restrictions Precautions Precautions: Back;Fall Precaution Booklet Issued: No Required Braces or Orthoses: Spinal Brace (for comfort) Spinal Brace: Lumbar corset;Applied in sitting position Restrictions Weight Bearing Restrictions: No      Mobility  Bed Mobility Overal bed mobility: Needs Assistance Bed Mobility: Rolling, Sidelying to Sit, Sit to  Sidelying Rolling: Supervision Sidelying to sit: Supervision     Sit to sidelying: Contact guard assist, HOB elevated General bed mobility comments: verbal cues for logroll technique    Transfers Overall transfer level: Needs assistance Equipment used: Rolling walker (2 wheels) Transfers: Sit to/from Stand Sit to Stand: Supervision           General transfer comment: standing from bed and from toilet. 3 standing bouts performed    Ambulation/Gait Ambulation/Gait assistance: Supervision Gait Distance (Feet): 175 Feet Assistive device: Rolling walker (2 wheels) Gait Pattern/deviations: Step-through pattern Gait velocity: decreased     General Gait Details: patient ambulated without difficulty 2 laps around the nursing station. no loss of balance. encouraged patient to use rolling walker for safety  Stairs Stairs:  (patient declined)          Wheelchair Mobility     Tilt Bed    Modified Rankin (Stroke Patients Only)       Balance Overall balance assessment: Needs assistance Sitting-balance support: Feet supported Sitting balance-Leahy Scale: Good     Standing balance support: No upper extremity supported Standing balance-Leahy Scale: Fair                               Pertinent Vitals/Pain Pain Assessment Pain Assessment: Faces Faces Pain Scale: Hurts little more Pain Location: lower back Pain Descriptors / Indicators: Discomfort Pain Intervention(s): Limited activity within patient's tolerance, Monitored during session, Repositioned, Premedicated before session    Home Living Family/patient expects to be discharged to:: Private residence Living Arrangements: Spouse/significant other Available Help at Discharge: Family;Available 24 hours/day Type of Home: House Home Access: Stairs to enter Entrance Stairs-Rails: Doctor, general practice of  Steps: 3   Home Layout: Two level;Able to live on main level with  bedroom/bathroom Home Equipment: Hand held shower head;Grab bars - toilet;Grab bars - tub/shower;Shower seat;Toilet riser      Prior Function Prior Level of Function : Independent/Modified Independent                     Extremity/Trunk Assessment   Upper Extremity Assessment Upper Extremity Assessment: Overall WFL for tasks assessed    Lower Extremity Assessment Lower Extremity Assessment: Overall WFL for tasks assessed    Cervical / Trunk Assessment Cervical / Trunk Assessment: Back Surgery  Communication   Communication Communication: No apparent difficulties  Cognition Arousal: Alert Behavior During Therapy: WFL for tasks assessed/performed Overall Cognitive Status: Within Functional Limits for tasks assessed                                          General Comments General comments (skin integrity, edema, etc.): patient educated on logroll technique and general spine precuations. reviewed how to donn/doff LSO which was on during mobility today. mild nausea initially with moving around that subsides with increased activity. no dizziness is reported with mobility    Exercises     Assessment/Plan    PT Assessment Patient needs continued PT services  PT Problem List Decreased activity tolerance;Decreased balance;Decreased mobility;Decreased safety awareness;Decreased knowledge of use of DME       PT Treatment Interventions DME instruction;Gait training;Stair training;Functional mobility training;Therapeutic activities;Therapeutic exercise;Balance training;Neuromuscular re-education;Cognitive remediation;Patient/family education    PT Goals (Current goals can be found in the Care Plan section)  Acute Rehab PT Goals Patient Stated Goal: to go home PT Goal Formulation: With patient Time For Goal Achievement: 08/18/23 Potential to Achieve Goals: Good    Frequency Min 1X/week     Co-evaluation               AM-PAC PT "6 Clicks"  Mobility  Outcome Measure Help needed turning from your back to your side while in a flat bed without using bedrails?: None Help needed moving from lying on your back to sitting on the side of a flat bed without using bedrails?: A Little Help needed moving to and from a bed to a chair (including a wheelchair)?: A Little Help needed standing up from a chair using your arms (e.g., wheelchair or bedside chair)?: A Little Help needed to walk in hospital room?: A Little Help needed climbing 3-5 steps with a railing? : A Little 6 Click Score: 19    End of Session Equipment Utilized During Treatment: Back brace Activity Tolerance: Patient tolerated treatment well Patient left: in bed;with call bell/phone within reach;with family/visitor present;with SCD's reapplied Nurse Communication: Mobility status PT Visit Diagnosis: Other abnormalities of gait and mobility (R26.89);Muscle weakness (generalized) (M62.81)    Time: 0102-7253 PT Time Calculation (min) (ACUTE ONLY): 30 min   Charges:   PT Evaluation $PT Eval Low Complexity: 1 Low PT Treatments $Therapeutic Activity: 8-22 mins PT General Charges $$ ACUTE PT VISIT: 1 Visit         Donna Bernard, PT, MPT   Ina Homes 08/11/2023, 10:09 AM

## 2023-08-11 NOTE — Progress Notes (Signed)
DISCHARGE NOTE:  Pt given discharge instructions and verbalized understanding. Pt wheeled to car by staff, son providing transportation.

## 2023-08-11 NOTE — TOC Transition Note (Signed)
Transition of Care [facility]) - CM/SW Discharge Note   Patient Details  Name: Lindsay Fields MRN: [id number] Date of Birth: 1951/03/08  [facility] of [facility]) CM/SW Contact:  Hetty Ely, RN Phone Number: 08/11/2023, 3:10 PM    Final next level of care: Home w Home Health Services Barriers to Discharge: Barriers Resolved     Patient and family notified of of transfer: 08/11/23  Discharge Plan and Services Additional resources added to the After Visit Summary for                  DME Arranged: Walker rolling DME Agency: [employer] Date DME Agency Contacted: 08/11/23   Representative spoke with at DME Agency: Osvaldo Angst Arranged: PT [facility] Agency: [facility] Date [facility] Agency Contacted: 08/11/23 Time HH Agency Contacted: 0204 Representative spoke with at [facility] Agency: Coralee North  Social Determinants of Health (SDOH) Interventions SDOH Screenings   Food Insecurity: No Food Insecurity (08/10/2023)  Housing: Low Risk  (08/10/2023)  Transportation Needs: No Transportation Needs (08/10/2023)  Utilities: Not At Risk (08/10/2023)  Depression (PHQ2-9): Low Risk  (02/24/2023)  Financial Resource Strain: Low Risk  (01/28/2023)   Received from [facility], [facility]  Tobacco Use: Low Risk  (08/10/2023)  Recent Concern: Tobacco Use - Medium Risk (06/29/2023)   Received from [facility]     Readmission Risk Interventions     No data to display

## 2023-08-11 NOTE — Discharge Summary (Signed)
Discharge Summary  Patient ID: Lindsay Fields MRN: [id number] DOB/AGE: 1951-09-08 72 y.o.  Admit date: 08/10/2023 Discharge date: 08/11/2023  Admission Diagnoses:  M43.16 Spondylolisthesis, lumbar region, M53.2X6 Spinal instability, lumbar, M54.42, G89.29 Chronic bilateral low back pain with left-sided sciatica .  Discharge Diagnoses:  Principal Problem:   S/P lumbar fusion Active Problems:   Spondylolisthesis of lumbar region   Lumbar spine instability   Chronic bilateral low back pain with left-sided sciatica   Discharged Condition: good  Hospital Course:  Lindsay Fields is a pleasant 72 year old presenting with symptomatic lumbar spondylolisthesis status post L5-S1 TLIF.  Her intraoperative course was uncomplicated.  She was admitted overnight for pain control, therapy evaluation, and drain monitoring.  Her drain output was minimal postoperatively and was removed on the afternoon of postop day 1.  She was seen and evaluated by therapy and deemed appropriate for discharge home on postop day 1.  She was given prescriptions for oxycodone, Robaxin, and senna to take as needed.   Consults: None  Significant Diagnostic Studies: NA  Treatments: surgery: As above.  Please see separately dictated operative report for further details.  Discharge Exam: Blood pressure 115/61, pulse 72, temperature 98.3 F (36.8 C), resp. rate 18, height 5' (1.524 m), weight 63 kg, SpO2 97%. AA Ox3 CNI   Strength:5/5 throughout BLE Incisions with post-op dressings in place  Disposition: Discharge disposition: 01-Home or Self Care       Discharge Instructions     Incentive spirometry RT   Complete by: As directed       Allergies as of 08/11/2023       Reactions   Abaloparatide Other (See Comments)   Constipation,headache, nausea        Medication List     TAKE these medications    acetaminophen 500 MG tablet Commonly known as: TYLENOL Take 1,000 mg by mouth every 6 (six) hours as  needed.   bisoprolol 5 MG tablet Commonly known as: ZEBETA Take 2.5 mg by mouth daily.   CALCIUM-VITAMIN D PO Take 1 tablet by mouth daily. Calcium 750mg    carbidopa-levodopa 25-100 MG tablet Commonly known as: SINEMET IR Take 1 tablet by mouth 3 (three) times daily.   denosumab 60 MG/ML Sosy injection Commonly known as: PROLIA Inject 60 mg into the skin every 6 (six) months.   fluticasone 50 MCG/ACT nasal spray Commonly known as: FLONASE Place 2 sprays into both nostrils 2 (two) times daily as needed for allergies or rhinitis.   gabapentin 300 MG capsule Commonly known as: Neurontin Take 1 capsule (300 mg total) by mouth at bedtime.   levocetirizine 5 MG tablet Commonly known as: XYZAL Take 5 mg by mouth every evening.   Magnesium 250 MG Tabs Take 1 tablet by mouth daily as needed.   methocarbamol 500 MG tablet Commonly known as: ROBAXIN Take 1 tablet (500 mg total) by mouth every 8 (eight) hours as needed for muscle spasms.   oxyCODONE 5 MG immediate release tablet Commonly known as: Oxy IR/ROXICODONE Take 1 tablet (5 mg total) by mouth every 4 (four) hours as needed (pain).   rosuvastatin 5 MG tablet Commonly known as: CRESTOR Take 1 tablet by mouth daily after supper.   senna 8.6 MG Tabs tablet Commonly known as: SENOKOT Take 1 tablet (8.6 mg total) by mouth daily as needed for mild constipation.   vitamin D3 50 MCG (2000 UT) Caps Take 2,000 Units by mouth every other day.  Durable Medical Equipment  (From admission, onward)           Start     Ordered   08/11/23 1122  For home use only DME 4 wheeled rolling walker with seat  Once       Question:  Patient needs a walker to treat with the following condition  Answer:  Spondylolisthesis   08/11/23 1121            Follow-up Information     Drake Leach, PA-C Follow up on 08/25/2023.   Specialty: Neurosurgery Contact information: [address] [place]  [place] [address] [phone]                 Signed: Susanne Borders 08/11/2023, 2:10 PM

## 2023-08-11 NOTE — Progress Notes (Signed)
   Neurosurgery Progress Note  History: Lindsay Fields is s/p L5-S1 TLIF  POD1: Soreness/stiffness in her back which is responding to pain medication. Denies any leg pain this morning. Did not rest well overnight   Physical Exam: Vitals:   08/11/23 0623 08/11/23 0733  BP: (!) 106/58 115/61  Pulse: 72 72  Resp: 17 18  Temp: (!) 97.1 F (36.2 C) 98.3 F (36.8 C)  SpO2: 96% 97%    AA Ox3 CNI  Strength:5/5 throughout BLE HV 2 recorded overnight.  Data:  Other tests/results: none  Assessment/Plan:  Lindsay Fields is a 72 y.o presenting with spondylolisthesis and spinal instability s/p L5-S1 TLIF.  - mobilize - pain control - DVT prophylaxis - Will continue to monitor HV output - PTOT  Manning Charity PA-C Department of Neurosurgery

## 2023-08-11 NOTE — Plan of Care (Signed)
  Problem: Pain Management: Goal: General experience of comfort will improve 08/11/2023 0830 by Wilfred Lacy, RN Outcome: Progressing 08/10/2023 1906 by Wilfred Lacy, RN Outcome: Progressing   Problem: Safety: Goal: Ability to remain free from injury will improve Outcome: Progressing

## 2023-08-15 NOTE — Anesthesia Postprocedure Evaluation (Signed)
Anesthesia Post Note  Patient: BRIANE BIRDEN  Procedure(s) Performed: LEFT L5-S1 MINIMALLY INVASIVE (MIS) TRANSFORAMINAL LUMBAR INTERBODY FUSION (TLIF) (Left) APPLICATION OF INTRAOPERATIVE CT SCAN  Patient location during evaluation: PACU Anesthesia Type: General Level of consciousness: awake and alert Pain management: pain level controlled Vital Signs Assessment: post-procedure vital signs reviewed and stable Respiratory status: spontaneous breathing, nonlabored ventilation, respiratory function stable and patient connected to nasal cannula oxygen Cardiovascular status: blood pressure returned to baseline and stable Postop Assessment: no apparent nausea or vomiting Anesthetic complications: no   No notable events documented.   Last Vitals:  Vitals:   08/11/23 0733 08/11/23 1418  BP: 115/61 (!) 112/58  Pulse: 72 79  Resp: 18 18  Temp: 36.8 C 36.6 C  SpO2: 97% 98%    Last Pain:  Vitals:   08/11/23 1502  TempSrc:   PainSc: 0-No pain                 Lenard Simmer

## 2023-08-16 ENCOUNTER — Encounter: Payer: Self-pay | Admitting: Neurosurgery

## 2023-08-17 ENCOUNTER — Encounter: Payer: Self-pay | Admitting: Neurosurgery

## 2023-08-17 ENCOUNTER — Telehealth: Payer: Self-pay | Admitting: Neurosurgery

## 2023-08-17 MED ORDER — OXYCODONE HCL 5 MG PO TABS: 5.0000 mg | ORAL_TABLET | ORAL | 0 refills | Status: DC | PRN

## 2023-08-17 NOTE — Telephone Encounter (Signed)
Spoke with patient- would like oxycodone sent to pharmacy

## 2023-08-17 NOTE — Telephone Encounter (Signed)
FYI reporting pain-[phone] Lindsay Fields went to see the patient today, she is calling in her pain 7 out of 10. She had just taken her oxycodone 15 mins before that. By the end of the session is was down to 3.

## 2023-08-17 NOTE — Telephone Encounter (Signed)
DOS 08/10/23   L5-S1 TLIF    She just had surgery. I can refill her oxycodone. Does she want that?

## 2023-08-17 NOTE — Telephone Encounter (Signed)
left L5-S1 TLIF on 08/10/23  She needs a verbal ok that the patient's protocol was changed from 3 x 2, to 3 x 1 and 2 x 1. Patient is doing well.

## 2023-08-17 NOTE — Telephone Encounter (Signed)
Patient has called stating she knows she may not be able to get oxycodone again, but she is wondering if their is another pain medicine that she can be prescribed (better than tylenol)? Pt is having a sharp pain in her right hip when sitting down.  Please advise

## 2023-08-17 NOTE — Telephone Encounter (Signed)
Noted  

## 2023-08-17 NOTE — Telephone Encounter (Signed)
Oxycodone sent to pharmacy. Please let her know.

## 2023-08-17 NOTE — Telephone Encounter (Signed)
Spoke with Lindsay Fields and gave the verbal ok. She verbalized understanding.

## 2023-08-17 NOTE — Addendum Note (Signed)
Addended byDrake Leach on: 08/17/2023 02:14 PM   Modules accepted: Orders

## 2023-08-19 NOTE — Progress Notes (Unsigned)
   REFERRING PHYSICIAN:  [name], Md [address] [facility] [place],  [place] [address]  DOS: 08/10/23  L5-S1 TLIF  HISTORY OF PRESENT ILLNESS: Lindsay Fields is approximately 2 weeks status post above surgery. Was given oxycodone and robaxin on discharge from the hospital.   She has intermittent right-sided low back pain that is worse with getting in and out of a car.  She has no leg pain!  No numbness,tingling, or weakness.  She has only been taking oxycodone once or twice a day.  She would like to try something a little less strong.  She is taking as needed Robaxin.   PHYSICAL EXAMINATION:  General: Patient is well developed, well nourished, calm, collected, and in no apparent distress.   NEUROLOGICAL:  General: In no acute distress.   Awake, alert, oriented to person, place, and time.  Pupils equal round and reactive to light.  Facial tone is symmetric.     Strength:            Side Iliopsoas Quads Hamstring PF DF EHL  R 5 5 5 5 5 5   L 5 5 5 5 5 5    Incision c/d/I, mild bruising noted.    ROS (Neurologic):  Negative except as noted above  IMAGING: Nothing new to review.   ASSESSMENT/PLAN:  Lindsay Fields is doing well s/p above surgery. Treatment options reviewed with patient and following plan made:   - I have advised the patient to lift up to 10 pounds until 6 weeks after surgery (follow up with Dr. Myer Haff).  - Reviewed wound care.  - No bending, twisting, or lifting.  - Continue on current medications including prn robaxin. Will stop oxycodone and start norco 5 q 6 hours prn.  Reviewed dosing and side effects.  PMP reviewed and is appropriate. - Follow up as scheduled in 4 weeks and prn.   Advised to contact the office if any questions or concerns arise.  Drake Leach PA-C Department of neurosurgery

## 2023-08-23 ENCOUNTER — Encounter: Payer: Self-pay | Admitting: Student in an Organized Health Care Education/Training Program

## 2023-08-23 ENCOUNTER — Ambulatory Visit
Payer: Medicare HMO | Attending: Student in an Organized Health Care Education/Training Program | Admitting: Student in an Organized Health Care Education/Training Program

## 2023-08-23 DIAGNOSIS — M47816 Spondylosis without myelopathy or radiculopathy, lumbar region: Secondary | ICD-10-CM | POA: Diagnosis present

## 2023-08-23 DIAGNOSIS — G894 Chronic pain syndrome: Secondary | ICD-10-CM | POA: Insufficient documentation

## 2023-08-23 DIAGNOSIS — M5416 Radiculopathy, lumbar region: Secondary | ICD-10-CM | POA: Insufficient documentation

## 2023-08-23 DIAGNOSIS — M7138 Other bursal cyst, other site: Secondary | ICD-10-CM | POA: Diagnosis present

## 2023-08-23 MED ORDER — GABAPENTIN 300 MG PO CAPS
300.0000 mg | ORAL_CAPSULE | Freq: Two times a day (BID) | ORAL | 2 refills | Status: DC
Start: 2023-08-23 — End: 2023-11-14

## 2023-08-23 NOTE — Progress Notes (Signed)
PROVIDER NOTE: Information contained herein reflects review and annotations entered in association with encounter. Interpretation of such information and data should be left to medically-trained personnel. Information provided to patient can be located elsewhere in the medical record under "Patient Instructions". Document created using STT-dictation technology, any transcriptional errors that may result from process are unintentional.    Patient: Lindsay Fields  Service Category: E/M  Provider: Edward Jolly, MD  DOB: March 19, 1951  DOS: 08/23/2023  Referring Provider: [name], MD  MRN: [id number]  Specialty: Interventional Pain Management  PCP: [name], MD  Type: Established Patient  Setting: Ambulatory outpatient    Location: Office  Delivery: Face-to-face     HPI  [name], a 72 y.o. year old female, is here today because of her No primary diagnosis found.. [name] primary complain today is Back Pain  Pertinent problems: [name] has Spondylosis without myelopathy or radiculopathy, lumbar region and Chronic pain syndrome on their pertinent problem list. Pain Assessment: Severity of Chronic pain is reported as a 3 /10. Location: Back Lower/denies. Onset: More than a month ago. Quality: Sore. Timing: Constant. Modifying factor(s): back surgery, meds. Vitals:  height is 5\' 6"  (1.676 m) and weight is 137 lb (62.1 kg). Her temperature is 96.4 F (35.8 C) (abnormal). Her blood pressure is 137/59 (abnormal) and her pulse is 69. Her respiration is 16 and oxygen saturation is 100%.  BMI: Estimated body mass index is 22.11 kg/m as calculated from the following:   Height as of this encounter: 5\' 6"  (1.676 m).   Weight as of this encounter: 137 lb (62.1 kg). Last encounter: 02/24/2023. Last procedure: Visit date not found.  Reason for encounter: patient-requested evaluation.   Since the patient's last clinic visit with me, left L5- S1 minimally invasive transforaminal  lumbar interbody fusion with Dr. Marcell Barlow.  This was done 08/10/2023. States that she is recovering from surgery and that each day post op is better Weaning herself down on the Oxycodone. Taking Robaxin and APAP 1000 mg TID Requesting a refill on gabapentin.  She is currently at 300 mg nightly.  We discussed increasing to 300 mg twice daily especially in her postoperative/healing time.    ROS  Constitutional: Denies any fever or chills Gastrointestinal: No reported hemesis, hematochezia, vomiting, or acute GI distress Musculoskeletal:  low back pain Neurological: No reported episodes of acute onset apraxia, aphasia, dysarthria, agnosia, amnesia, paralysis, loss of coordination, or loss of consciousness  Medication Review  Calcium-Vitamin D, Magnesium, acetaminophen, bisoprolol, carbidopa-levodopa, denosumab, fluticasone, gabapentin, levocetirizine, methocarbamol, oxyCODONE, rosuvastatin, senna, and vitamin D3  History Review  Allergy: [name] is allergic to abaloparatide. Drug: [name]  reports no history of drug use. Alcohol:  reports current alcohol use of about 3.0 - 5.0 standard drinks of alcohol per week. Tobacco:  reports that she has never smoked. She has never used smokeless tobacco. Social: [name]  reports that she has never smoked. She has never used smokeless tobacco. She reports current alcohol use of about 3.0 - 5.0 standard drinks of alcohol per week. She reports that she does not use drugs. Medical:  has a past medical history of Allergic genetic state, Arthritis, Bladder polyps, Degenerative disc disease, lumbar, Dysrhythmia, Fibrocystic breast disease, Frequent UTI, H/O degenerative disc disease, Hypercholesterolemia, Hyperlipidemia, Lumbar radicular pain, Osteoarthritis of left hip, Osteoporosis, Parkinson's disease (HCC), Pre-diabetes, and Stage 3a chronic kidney disease (CKD) (HCC). Surgical: [name]  has a past surgical history that includes Bladder fulguration;  Colonoscopy (01/01/2013); Dilation and curettage of uterus (1998); Abdominal hysterectomy; Ovarian cyst removal (Left, 1985); Tonsillectomy and adenoidectomy (1978); Colonoscopy with propofol (N/A, 07/12/2018); Colonoscopy (N/A, 03/31/2022); Esophagogastroduodenoscopy (N/A, 03/31/2022); Tubal ligation; Transforaminal lumbar interbody fusion w/ mis 1 level (Left, 08/10/2023); and Application of intraoperative CT scan (N/A, 08/10/2023). Family: family history includes Breast cancer (age of onset: 15) in her maternal aunt.  Laboratory Chemistry Profile   Renal Lab Results  Component Value Date   CREATININE 0.51 08/10/2023   GFRNONAA >60 08/10/2023    Hepatic No results found for: "AST", "ALT", "ALBUMIN", "ALKPHOS", "HCVAB", "AMYLASE", "LIPASE", "AMMONIA"  Electrolytes No results found for: "NA", "K", "CL", "CALCIUM", "MG", "PHOS"  Bone No results found for: "VD25OH", "VD125OH2TOT", "WU9811BJ4", "NW2956OZ3", "25OHVITD1", "25OHVITD2", "25OHVITD3", "TESTOFREE", "TESTOSTERONE"  Inflammation (CRP: Acute Phase) (ESR: Chronic Phase) No results found for: "CRP", "ESRSEDRATE", "LATICACIDVEN"       Note: Above Lab results reviewed.  Recent Imaging Review  DG Lumbar Spine 2-3 Views CLINICAL DATA:  Elective surgery.  EXAM: LUMBAR SPINE - 2-3 VIEW  COMPARISON:  None Available.  FINDINGS: Four fluoroscopic spot views as well as intraoperative CT images obtained of the lumbar spine in the operating room. Sequential images during L5-S1 fusion. Fluoroscopy time 1 minutes 2 seconds. Dose 76.87 mGy.  IMPRESSION: Intraoperative fluoroscopy during L5-S1 fusion.  Electronically Signed   By: Narda Rutherford M.D.   On: 08/10/2023 16:55 DG C-Arm 1-60 Min-No Report Fluoroscopy was utilized by the requesting physician.  No radiographic  interpretation.  DG C-Arm 1-60 Min-No Report Fluoroscopy was utilized by the requesting physician.  No radiographic  interpretation.  DG C-Arm 1-60 Min-No  Report Fluoroscopy was utilized by the requesting physician.  No radiographic  interpretation.  Note: Reviewed        Physical Exam  General appearance: Well nourished, well developed, and well hydrated. In no apparent acute distress Mental status: Alert, oriented x 3 (person, place, & time)       Respiratory: No evidence of acute respiratory distress Eyes: PERLA Vitals: BP (!) 137/59   Pulse 69   Temp (!) 96.4 F (35.8 C)   Resp 16   Ht 5\' 6"  (1.676 m)   Wt 137 lb (62.1 kg)   SpO2 100%   BMI 22.11 kg/m  BMI: Estimated body mass index is 22.11 kg/m as calculated from the following:   Height as of this encounter: 5\' 6"  (1.676 m).   Weight as of this encounter: 137 lb (62.1 kg). Ideal: Ideal body weight: 59.3 kg (130 lb 11.7 oz) Adjusted ideal body weight: 60.4 kg (133 lb 3.8 oz)  Low back pain surgical site healing appropriately Incision: Clean, dry, nonerythematous, nontender  5 out of 5 strength bilateral lower extremity: Plantar flexion, dorsiflexion, knee flexion, knee extension.   Assessment   Diagnosis Status  1. Chronic pain syndrome   2. Lumbar radicular pain (LEFT L5/S1)   3. Lumbar facet arthropathy   4. 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 Spondylosis without myelopathy or radiculopathy, lumbar region    Controlled Controlled Controlled   Updated Problems: No problems updated.  Plan of Care    [name] has a current medication list which includes the following long-term medication(s): bisoprolol, calcium-vitamin d, carbidopa-levodopa, fluticasone, levocetirizine, rosuvastatin, and gabapentin.  Pharmacotherapy (Medications Ordered): Meds ordered this encounter  Medications   gabapentin (NEURONTIN) 300 MG capsule    Sig: Take 1 capsule (300 mg total) by mouth 2 (two) times daily.  Dispense:  60 capsule    Refill:  2    Fill one day early if pharmacy is closed on scheduled refill date. May substitute for generic if available.    Orders:  No orders of the defined types were placed in this encounter.  Follow-up plan:   Return in about 3 months (around 11/23/2023) for MM, F2F.      Status post left L4-L5 Sprint peripheral nerve stimulation, left L4-L5 epidural steroid injection 08/02/2022    Recent Visits No visits were found meeting these conditions. Showing recent visits within past 90 days and meeting all other requirements Today's Visits Date Type Provider Dept  08/23/23 Office Visit Edward Jolly, MD [facility]  Showing today's visits and meeting all other requirements Future Appointments No visits were found meeting these conditions. Showing future appointments within next 90 days and meeting all other requirements  I discussed the assessment and treatment plan with the patient. The patient was provided an opportunity to ask questions and all were answered. The patient agreed with the plan and demonstrated an understanding of the instructions.  Patient advised to call back or seek an in-person evaluation if the symptoms or condition worsens.  Duration of encounter: 15 minutes.  Total time on encounter, as per AMA guidelines included both the face-to-face and non-face-to-face time personally spent by the physician and/or other qualified health care professional(s) on the day of the encounter (includes time in activities that require the physician or other qualified health care professional and does not include time in activities normally performed by clinical staff). Physician's time may include the following activities when performed: Preparing to see the patient (e.g., pre-charting review of records, searching for previously ordered imaging, lab work, and nerve conduction tests) Review of prior analgesic pharmacotherapies. Reviewing PMP Interpreting ordered tests (e.g., lab work, imaging, nerve conduction tests) Performing post-procedure evaluations, including interpretation of diagnostic  procedures Obtaining and/or reviewing separately obtained history Performing a medically appropriate examination and/or evaluation Counseling and educating the patient/family/caregiver Ordering medications, tests, or procedures Referring and communicating with other health care professionals (when not separately reported) Documenting clinical information in the electronic or other health record Independently interpreting results (not separately reported) and communicating results to the patient/ family/caregiver Care coordination (not separately reported)  Note by: Edward Jolly, MD Date: 08/23/2023; Time: 3:26 PM

## 2023-08-25 ENCOUNTER — Ambulatory Visit (INDEPENDENT_AMBULATORY_CARE_PROVIDER_SITE_OTHER): Payer: Medicare HMO | Admitting: Orthopedic Surgery

## 2023-08-25 ENCOUNTER — Encounter: Payer: Self-pay | Admitting: Orthopedic Surgery

## 2023-08-25 VITALS — BP 122/88 | Temp 97.5°F | Ht 66.0 in | Wt 137.0 lb

## 2023-08-25 DIAGNOSIS — Z981 Arthrodesis status: Secondary | ICD-10-CM

## 2023-08-25 DIAGNOSIS — M4316 Spondylolisthesis, lumbar region: Secondary | ICD-10-CM

## 2023-08-25 MED ORDER — HYDROCODONE-ACETAMINOPHEN 5-325 MG PO TABS: 1.0000 | ORAL_TABLET | Freq: Four times a day (QID) | ORAL | 0 refills | Status: DC | PRN

## 2023-08-25 NOTE — Patient Instructions (Signed)
It was nice to see you today.   I am glad that you are feeling better!   Okay to get incision wet in the shower, do not submerge in pool or hot tub.   Call if any concerns about the incision such as redness, drainage, or fever/chills.   No bending, twisting, or lifting. You can lift up to 10 pounds until your follow up with Dr.  Myer Haff in 4 weeks.   I sent hydrocodone to your pharmacy. Continue to take the least amount needed and only take for severe pain. Remember this medication can make you sleepy and/or constipated. Stop the oxycodone.   Watch the tylenol- you cannot take more than 3000-4000mg  per day. Each hydrocodone has 325mg  of tylenol it it.   Continue on methocarbamol as needed. This can make you sleepy.   We will see you back in 4 weeks for your 6 weeks postop visit.   Please call with any questions or concerns.   Drake Leach PA-C [phone]     The physicians and staff at [facility] at [facility] are committed to providing excellent care. You may receive a survey asking for feedback about your experience at our office. We value you your feedback and appreciate you taking the time to to fill it out. The [facility] leadership team is also available to discuss your experience in person, feel free to contact us [phone].

## 2023-08-31 ENCOUNTER — Encounter: Payer: Self-pay | Admitting: Neurosurgery

## 2023-09-12 ENCOUNTER — Telehealth: Payer: Self-pay | Admitting: Family Medicine

## 2023-09-12 ENCOUNTER — Telehealth: Payer: Self-pay | Admitting: Neurosurgery

## 2023-09-12 NOTE — Telephone Encounter (Signed)
DOS: 08/10/23  L5-S1 TLIF   Robaxin sent to pharmacy. Please let her know.

## 2023-09-12 NOTE — Telephone Encounter (Signed)
PA requested from Cover [employer] Meds Methocarbamol 500mg  Approval ID# [id number] Expires 10/11/2023

## 2023-09-12 NOTE — Telephone Encounter (Signed)
Patient notified

## 2023-09-22 ENCOUNTER — Ambulatory Visit
Admission: RE | Admit: 2023-09-22 | Discharge: 2023-09-22 | Disposition: A | Discharge status: Home / Self Care | Payer: Medicare HMO | Source: Ambulatory Visit | Attending: Neurosurgery | Admitting: Neurosurgery

## 2023-09-22 ENCOUNTER — Other Ambulatory Visit: Payer: Self-pay

## 2023-09-22 ENCOUNTER — Ambulatory Visit: Payer: Medicare HMO | Admitting: Neurosurgery

## 2023-09-22 ENCOUNTER — Ambulatory Visit
Admission: RE | Admit: 2023-09-22 | Discharge: 2023-09-22 | Disposition: A | Discharge status: Home / Self Care | Payer: Medicare HMO | Attending: Neurosurgery | Admitting: Neurosurgery

## 2023-09-22 VITALS — BP 112/70 | Temp 97.5°F | Ht 66.0 in | Wt 137.0 lb

## 2023-09-22 DIAGNOSIS — T84498A Other mechanical complication of other internal orthopedic devices, implants and grafts, initial encounter: Secondary | ICD-10-CM

## 2023-09-22 DIAGNOSIS — M4316 Spondylolisthesis, lumbar region: Secondary | ICD-10-CM

## 2023-09-22 DIAGNOSIS — M810 Age-related osteoporosis without current pathological fracture: Secondary | ICD-10-CM | POA: Diagnosis not present

## 2023-09-22 NOTE — Addendum Note (Signed)
Addended by: Sharlot Gowda on: 09/22/2023 03:33 PM   Modules accepted: Orders

## 2023-09-22 NOTE — Progress Notes (Signed)
   REFERRING PHYSICIAN:  [name], Md [address] [facility] [place],  [place] [address]  DOS: 08/10/23  L5-S1 TLIF  HISTORY OF PRESENT ILLNESS: Lindsay Fields is approximately 6 weeks status post above surgery.  She is doing very well.  She has minimal right sided back pain.  Her leg pain is gone.        PHYSICAL EXAMINATION:  General: Patient is well developed, well nourished, calm, collected, and in no apparent distress.   NEUROLOGICAL:  General: In no acute distress.   Awake, alert, oriented to person, place, and time.  Pupils equal round and reactive to light.  Facial tone is symmetric.     Strength:            Side Iliopsoas Quads Hamstring PF DF EHL  R 5 5 5 5 5 5   L 5 5 5 5 5 5    Incision c/d/I, mild bruising noted.    ROS (Neurologic):  Negative except as noted above  IMAGING: Lumbar spine radiographs from September 22, 2023 show a failure in the left side of the construct.  Her rod has repositioned superiorly and is no longer connecting the L5 to the S1 pedicle screw.  ASSESSMENT/PLAN:  Lindsay Fields is doing well s/p above surgery.  Unfortunately, she has an implant malfunction of her orthopedic hardware.  Due to her osteoporosis, I do not think that leaving this in position is appropriate.  I have recommended removal of the rod and repositioning of the rod into the screws.  We will then relock the screws into position.  If there is a malfunction in the tulip head, we may have to replace the pedicle screws on the left side.    I discussed the planned procedure at length with the patient, including the risks, benefits, alternatives, and indications. The risks discussed include but are not limited to bleeding, infection, need for reoperation, spinal fluid leak, stroke, vision loss, anesthetic complication, coma, paralysis, and even death. I also described in detail that improvement was not guaranteed.  The patient expressed understanding of  these risks, and asked that we proceed with surgery. I described the surgery in layman's terms, and gave ample opportunity for questions, which were answered to the best of my ability.    Venetia Night MD Department of neurosurgery

## 2023-09-22 NOTE — Patient Instructions (Signed)
  Please see below for information in regards to your upcoming surgery:   Planned surgery: Removal and replacement of left rod   Surgery date: 10/17/23 at [facility] (Medical Mall: [address] [place], [place] [address]) - you will find out your arrival time the business day before your surgery.   Pre-op appointment at [facility] Pre-admit Testing: we will call you with a date/time for this. If you are scheduled for an in person appointment, Pre-admit Testing is located on the first floor of the [facility] building, [address], [address]. Please bring all prescriptions in the original prescription bottles to your appointment. During this appointment, they will advise you which medications you can take the morning of surgery, and which medications you will need to hold for surgery. Labs (such as blood work, EKG) may be done at your pre-op appointment. You are not required to fast for these labs. Should you need to change your pre-op appointment, please call Pre-admit testing at [phone].      Brace: You will need to bring the brace to the hospital on the day of surgery. [facility] will contact you regarding an appointment for the brace you will use after surgery. If it is getting close to your surgery date and you have not received an appointment with [facility], please reach out to [place].  Their number is [phone] should you miss their call or have an issue with your brace after surgery.     NSAIDS (Non-steroidal anti-inflammatory drugs): because you are having a fusion, please avoid taking any NSAIDS (examples: ibuprofen, motrin, aleve, naproxen, meloxicam, diclofenac) for 3 months after surgery. Celebrex is an exception and is OK to take, if prescribed. Tylenol is not an NSAID.    Common restrictions after surgery: No bending, lifting, or twisting ("BLT"). Avoid lifting objects heavier than 10 pounds for the first 6 weeks after surgery.  Where possible, avoid household activities that involve lifting, bending, reaching, pushing, or pulling such as laundry, vacuuming, grocery shopping, and childcare. Try to arrange for help from friends and family for these activities while you heal. Do not drive while taking prescription pain medication. Weeks 6 through 12 after surgery: avoid lifting more than 25 pounds.    X-rays after surgery: Because you are having a fusion or arthroplasty: for appointments after your 2 week follow-up: please arrive at the [facility] outpatient imaging center ([address], [place]) or [facility] one hour prior to your appointment for x-rays. This applies to every appointment after your 2 week follow-up. Failure to do so may result in your appointment being rescheduled.   How to contact us:  If you have any questions/concerns before or after surgery, you can reach [place] at [phone], or you can send a mychart message. We can be reached by phone or mychart 8am-4pm, Monday-Friday.  *Please note: Calls after 4pm are forwarded to a third party answering service. Mychart messages are not routinely monitored during evenings, weekends, and holidays. Please call our office to contact the answering service for urgent concerns during non-business hours.    Appointments/FMLA & disability paperwork: Joycelyn Rua, & Flonnie Hailstone Registered Nurse/Surgery scheduler: Royston Cowper Medical Assistants: Nash Mantis Physician Assistants: Joan Flores, PA-C, Manning Charity, PA-C & Drake Leach, PA-C Surgeons: Venetia Night, MD & Ernestine Mcmurray, MD

## 2023-09-23 ENCOUNTER — Telehealth: Payer: Self-pay

## 2023-09-23 ENCOUNTER — Other Ambulatory Visit: Payer: Self-pay

## 2023-09-23 ENCOUNTER — Encounter: Payer: Self-pay | Admitting: Neurosurgery

## 2023-09-23 DIAGNOSIS — Z01818 Encounter for other preprocedural examination: Secondary | ICD-10-CM

## 2023-09-23 NOTE — Telephone Encounter (Signed)
-----   Message from [facility] sent at 09/23/2023  5:14 PM EST ----- Regarding: RE: You can change it to inpatient and let the patient know we will still discharge her that day and that it is an insurance requirement ----- Message ----- From: Sharlot Gowda, RN Sent: 09/23/2023   5:10 PM EST To: Venetia Night, MD  FYI, Epic says [phone] is on the inpatient only list, so I had to switch her from ambulatory to surgery admit.  If you told her she was going home the same day, let me know and I will tell her we had to switch it for insurance requirements.

## 2023-10-03 ENCOUNTER — Encounter
Admission: RE | Admit: 2023-10-03 | Discharge: 2023-10-03 | Disposition: A | Discharge status: Home / Self Care | Payer: Medicare HMO | Source: Ambulatory Visit | Attending: Neurosurgery | Admitting: Neurosurgery

## 2023-10-03 ENCOUNTER — Other Ambulatory Visit: Payer: Self-pay

## 2023-10-03 ENCOUNTER — Encounter: Payer: Self-pay | Admitting: Neurosurgery

## 2023-10-03 VITALS — BP 116/46 | HR 69 | Temp 97.8°F | Resp 20 | Ht 66.0 in | Wt 140.8 lb

## 2023-10-03 DIAGNOSIS — M5416 Radiculopathy, lumbar region: Secondary | ICD-10-CM | POA: Insufficient documentation

## 2023-10-03 DIAGNOSIS — Z01818 Encounter for other preprocedural examination: Secondary | ICD-10-CM | POA: Diagnosis present

## 2023-10-03 DIAGNOSIS — Z01812 Encounter for preprocedural laboratory examination: Secondary | ICD-10-CM

## 2023-10-03 LAB — TYPE AND SCREEN
ABO/RH(D): A POS
Antibody Screen: NEGATIVE

## 2023-10-03 LAB — URINALYSIS, COMPLETE (UACMP) WITH MICROSCOPIC
Bacteria, UA: NONE SEEN
Glucose, UA: NEGATIVE mg/dL
Hgb urine dipstick: NEGATIVE
Ketones, ur: 5 mg/dL — AB
Leukocytes,Ua: NEGATIVE
Nitrite: NEGATIVE
Protein, ur: NEGATIVE mg/dL
RBC / HPF: 0 RBC/hpf (ref 0–5)
Specific Gravity, Urine: 1.023 (ref 1.005–1.030)
pH: 5 (ref 5.0–8.0)

## 2023-10-03 LAB — SURGICAL PCR SCREEN
MRSA, PCR: NEGATIVE
Staphylococcus aureus: NEGATIVE

## 2023-10-03 NOTE — Patient Instructions (Addendum)
Your procedure is scheduled on: Monday, Jan 6 Report to the Registration Desk on the 1st floor of the [facility] To find out your arrival time, please call [phone] between 1PM - 3PM on: Friday , Jan 3 If your arrival time is 6:00 am, do not arrive before that time as the [facility] entrance doors do not open until 6:00 am.  REMEMBER: Instructions that are not followed completely may result in serious medical risk, up to and including death; or upon the discretion of your surgeon and anesthesiologist your surgery may need to be rescheduled.  Do not eat food after midnight the night before surgery.  No gum chewing or hard candies.  You may however, drink CLEAR liquids up to 2 hours before you are scheduled to arrive for your surgery. Do not drink anything within 2 hours of your scheduled arrival time.  Clear liquids include: - water  - apple juice without pulp - gatorade (not RED colors) - black coffee or tea (Do NOT add milk or creamers to the coffee or tea) Do NOT drink anything that is not on this list.    One week prior to surgery:  Stop ANY OVER THE COUNTER supplements until after surgery.  You may however, continue to take Tylenol if needed for pain up until the day of surgery.   Continue taking all of your other prescription medications up until the day of surgery.  ON THE DAY OF SURGERY ONLY TAKE THESE MEDICATIONS WITH SIPS OF WATER:  bisoprolol (ZEBETA)  carbidopa-levodopa gabapentin (NEURONTIN    No Alcohol for 24 hours before or after surgery.  No Smoking including e-cigarettes for 24 hours before surgery.  No chewable tobacco products for at least 6 hours before surgery.  No nicotine patches on the day of surgery.  Do not use any "recreational" drugs for at least a week (preferably 2 weeks) before your surgery.  Please be advised that the combination of cocaine and anesthesia may have negative outcomes, up to and including death. If you test  positive for cocaine, your surgery will be cancelled.  On the morning of surgery brush your teeth with toothpaste and water, you may rinse your mouth with mouthwash if you wish. Do not swallow any toothpaste or mouthwash.  Use CHG Soap or wipes as directed on instruction sheet.  Do not wear jewelry, make-up, hairpins, clips or nail polish.  For welded (permanent) jewelry: bracelets, anklets, waist bands, etc.  Please have this removed prior to surgery.  If it is not removed, there is a chance that hospital personnel will need to cut it off on the day of surgery.  Do not wear lotions, powders, or perfumes.   Do not shave body hair from the neck down 48 hours before surgery.  Contact lenses, hearing aids and dentures may not be worn into surgery.  Do not bring valuables to the hospital. [facility] is not responsible for any missing/lost belongings or valuables.     Notify your doctor if there is any change in your medical condition (cold, fever, infection).  Wear comfortable clothing (specific to your surgery type) to the hospital.  After surgery, you can help prevent lung complications by doing breathing exercises.  Take deep breaths and cough every 1-2 hours. Your doctor may order a device called an Incentive Spirometer to help you take deep breaths. If you are being admitted to the hospital overnight, leave your suitcase in the car. After surgery it may be brought to your  room.    If you are being discharged the day of surgery, you will not be allowed to drive home. You will need a responsible individual to drive you home and stay with you for 24 hours after surgery.    Please call the Pre-admissions Testing Dept. at [phone] if you have any questions about these instructions.  Surgery Visitation Policy:  Patients having surgery or a procedure may have two visitors.  Children under the age of 78 must have an adult with them who is not the patient.  Inpatient  Visitation:    Visiting hours are 7 a.m. to 8 p.m. Up to four visitors are allowed at one time in a patient room. The visitors may rotate out with other people during the day.  One visitor age 34 or older may stay with the patient overnight and must be in the room by 8 p.m.      Pre-operative 5 CHG Bath Instructions   You can play a key role in reducing the risk of infection after surgery. Your skin needs to be as free of germs as possible. You can reduce the number of germs on your skin by washing with CHG (chlorhexidine gluconate) soap before surgery. CHG is an antiseptic soap that kills germs and continues to kill germs even after washing.   DO NOT use if you have an allergy to chlorhexidine/CHG or antibacterial soaps. If your skin becomes reddened or irritated, stop using the CHG and notify one of our RNs at [phone].   Please shower with the CHG soap starting 4 days before surgery using the following schedule:     Please keep in mind the following:  DO NOT shave, including legs and underarms, starting the day of your first shower.   You may shave your face at any point before/day of surgery.  Place clean sheets on your bed the day you start using CHG soap. Use a clean washcloth (not used since being washed) for each shower. DO NOT sleep with pets once you start using the CHG.   CHG Shower Instructions:  If you choose to wash your hair and private area, wash first with your normal shampoo/soap.  After you use shampoo/soap, rinse your hair and body thoroughly to remove shampoo/soap residue.  Turn the water OFF and apply about 3 tablespoons (45 ml) of CHG soap to a CLEAN washcloth.  Apply CHG soap ONLY FROM YOUR NECK DOWN TO YOUR TOES (washing for 3-5 minutes)  DO NOT use CHG soap on face, private areas, open wounds, or sores.  Pay special attention to the area where your surgery is being performed.  If you are having back surgery, having someone wash your back for you may be  helpful. Wait 2 minutes after CHG soap is applied, then you may rinse off the CHG soap.  Pat dry with a clean towel  Put on clean clothes/pajamas   If you choose to wear lotion, please use ONLY the CHG-compatible lotions on the back of this paper.     Additional instructions for the day of surgery: DO NOT APPLY any lotions, deodorants, cologne, or perfumes.   Put on clean/comfortable clothes.  Brush your teeth.  Ask your nurse before applying any prescription medications to the skin.      CHG Compatible Lotions   Aveeno Moisturizing lotion  Cetaphil Moisturizing Cream  Cetaphil Moisturizing Lotion  Clairol Herbal Essence Moisturizing Lotion, Dry Skin  Clairol Herbal Essence Moisturizing Lotion, Extra Dry Skin  Clairol Herbal  Essence Moisturizing Lotion, Normal Skin  Curel Age Defying Therapeutic Moisturizing Lotion with Alpha Hydroxy  Curel Extreme Care Body Lotion  Curel Soothing Hands Moisturizing Hand Lotion  Curel Therapeutic Moisturizing Cream, Fragrance-Free  Curel Therapeutic Moisturizing Lotion, Fragrance-Free  Curel Therapeutic Moisturizing Lotion, Original Formula  Eucerin Daily Replenishing Lotion  Eucerin Dry Skin Therapy Plus Alpha Hydroxy Crme  Eucerin Dry Skin Therapy Plus Alpha Hydroxy Lotion  Eucerin Original Crme  Eucerin Original Lotion  Eucerin Plus Crme Eucerin Plus Lotion  Eucerin TriLipid Replenishing Lotion  Keri Anti-Bacterial Hand Lotion  Keri Deep Conditioning Original Lotion Dry Skin Formula Softly Scented  Keri Deep Conditioning Original Lotion, Fragrance Free Sensitive Skin Formula  Keri Lotion Fast Absorbing Fragrance Free Sensitive Skin Formula  Keri Lotion Fast Absorbing Softly Scented Dry Skin Formula  Keri Original Lotion  Keri Skin Renewal Lotion Keri Silky Smooth Lotion  Keri Silky Smooth Sensitive Skin Lotion  Nivea Body Creamy Conditioning Oil  Nivea Body Extra Enriched Teacher, adult education  Moisturizing Lotion Nivea Crme  Nivea Skin Firming Lotion  NutraDerm 30 Skin Lotion  NutraDerm Skin Lotion  NutraDerm Therapeutic Skin Cream  NutraDerm Therapeutic Skin Lotion  ProShield Protective Hand Cream  Provon moisturizing lotion

## 2023-10-07 NOTE — Pre-Procedure Instructions (Signed)
Progress Notes - documented in this encounter  Lindsay Fields, [name] - 06/16/2023 2:30 PM EDT Formatting of this note is different from the original. Images from the original note were not included. Ref Provider: Cristobal Goldmann* PCP: Allyn Kenner, MD  Assessment and Plan:  In most patients we give written parts of assessment and plan to patient under "Patient Instructions/After Visit Summary". So some parts are directed to patient.  Dear [name], It was our pleasure to participate in your care. We have typed up brief summary of what we discussed.  1. Parkinson's disease with left hemibody tremors, left > right bradykinesia, micrographia, hyposmia (reduced - since 2013), imbalance, vivid dreams at night or acting out of the dreams, constipation (occasional), some visual hallucinations with tizanidine, some balance issues, shorter steps  Continue/Refill Carbidopa/Levodopa 25/100 mg by mouth three times a day - discussed sinemet may worsen orthostatic hypotension. Can consider Ropinirole in future. No change today per patient preference  Morning Afternoon Evening Sinemet 25/100 1 tab 1 tab 1 tab  Completed course of physical therapy. Continue home exercises Reviewed MRI Brain - unremarkable  MRI Brain without contrast with NeuroQuant - Negative MRI of the brain. No acute or focal lesion to explain the patient's symptoms. NeuroQuant evaluation demonstrates that cortical gray matter is 1 standard deviation below the mean. Further detail above.  2. Lumbar degenerative joint disease, following pain management.  3. Vit D Deficiency - Labs reviewed: Vitamin D  4. Hypotension, asymptomatic at this time, has history of orthostatic hypotension Recommend monitoring at home Follow-up with Cardiology Improved on recheck with time and water  Orthostatic hypotension (neurogenic) can be seen in many conditions such as parkinsonism, diabetic neuropathy, pure autonomic  failure etc. Patient should have work up to rule out other causes first such as dehydration, cardiac issues, iatrogenic due to meds etc. Lightheadedness, fainting, fatigue, blurry vision, weakness etc can happen as symptoms. Patient should drink plenty of water, wear compression stockings, exercise, liberal salts, elevate the head end of the bed etc. Fludrocortisone, Midodrine, droxidopa etc. Can be tried in certain patients.  Return in about 6 months (around 12/14/2023).  Interim History date 06/16/2023  [name] is a 72 y.o. female here for treatment and evaluation of Parkinson's Disease  [name] last visit was on 12/14/2022  Patient states her Parkinson's disease symptoms are a little worse. She states her tremors are now in her left leg at rest. Taking sinemet 25/100 mg 1 pill three times a day. Denies chest pain, palpitations, shortness of breath, near syncope. She has ongoing orthostatic intolerance - no worse today. No new symptoms. No chest pain, palpitations, shortness of breath. She has some difficulty with fine motor movements. No recent falls. Appetite is good. Making healthier choices. Sleep is fair. No dream enactment. Denies dysphagia.  Disease Summary: (Aggregate of information from previous visits)  [name] is a left handed 72 y.o. female Retired [employer]chant [employer] officer) here for evaluation of Parkinson's Disease , referred by Lindsay Fields, [employer]*.  Parkinson's disease with left hemibody tremors, left > right bradykinesia, micrographia, hyposmia (reduced - since 2013), imbalance, vivid dreams at night or acting out of the dreams, constipation (occasional), some visual hallucinations with tizanidine, some balance issues, shorter steps Patient states she is here today for tremors. Her symptoms started August of 2023. Patient mentioned her symptoms started suddenly following right eye stye. Patient states her tremors affect her left hand and left leg. She states that her symptoms do not  particular worsen under stress. She did consume caffeinated beverages, but now she mix regular and decaf coffee to cut back. Patient mentioned that she does have a family history of Parkinson's disease.  She states she has increased her Gabapentin from 100 mg to 300 mg and has noticed that her symptoms worsened. Spoke with [name] and he states for the patient to go to 100 mg of Gabapentin only at night to see if the tremors improve.  At initial evaluation [name] does have unilateral resting tremors, feeling of stiffness, slowness of the movements, small hand writing, loss of sense of smell (reduced - since 2013), imbalance, vivid dreams at night or acting out of the dreams, constipation (occasional from medication), some visual hallucinations with tizanidine, some balance issues, shorter steps. Fall 08/2022 and couldn't get up.  At initial evaluation [name] denied decreased volume of speech, drooling, difficulty swallowing, decreased facial expressions, shuffling gait, short steps, decreased arm swing, hunched forward posture, depression, anxiety, urinary urgency, sexual dysfunction, drop in blood pressure with change in posture etc.  Medications Tried: Gabapentin, Carbidopa/Levodopa  General Exam:  Vitals: 06/16/23 1432 06/16/23 1437 06/16/23 1504 BP: (!) 83/55 (!) 84/59 101/68 BP Location: Left upper arm Left upper arm Patient Position: Sitting Standing Pulse: 75 76 70 Weight: 63.5 kg (140 lb) Height: 165.1 cm (5\' 5" )  Body mass index is 23.3 kg/m.  Patient looks appropriate of age, well built, nourished and appropriately groomed. Cardiovascular Exam: S1, S2 heart sounds present Carotid exam revealed no bruit Lung exam was clear to auscultation  Neurological Exam: Mental Status: Alert, Oriented to time, place, person and situation Attention span and concentration seemed appropriate Memory seemed OK. Intact naming, repetition, comprehension. Followed 2 step commands -  no dysarthria Fund of knowledge seemed appropriate for age and health status.  Cranial Nerves: Olfactory and vagus nerves not are examined Visual fields were full Pupils were equal, round and reactive to light and accommodation Extra-ocular movements are normal Facial sensations are normal Face is symmetric Finger rub was heard symmetric in both ears Palate and uvular movements are normal and oral sensations are OK Neck muscle strength and shoulder shrug is normal Tongue protrusion and uvular elevation are normal  Motor Exam: Tone is normal in all extremities Muscle strength in all extremities is 5/5.  Tremors are present in left arm and left leg.  Deep Tendon Reflexes: Right Biceps is 2+, Left Biceps is 2+ Right Triceps is 2+, Left Triceps is 2+ Right Brachioradialis is 2+, Left Brachioradialis is 2+ Right Knee Jerk is 2+, Left Knee Jerk is 2+ Right Ankle Jerk is 2+, Left Ankle Jerk is 2+ Right Toes are down going, Left Toes are down going  Sensory Exam: Sensations were intact to light touch in all extremities Vibration and proprioception are also intact  Co-ordination: Finger to nose is normal  Gait: Gait and station are normal when examined in small exam room Romberg's test is negative  Medications: Current Outpatient Medications on File Prior to Visit Medication Sig Dispense Refill acetaminophen (TYLENOL) 500 MG tablet Take 1,000 mg by mouth every 6 (six) hours as needed azelastine (ASTELIN) 137 mcg nasal spray Place 1 spray into both nostrils once daily as needed 3 bisoprolol (ZEBETA) 5 MG tablet TAKE 1/2 TABLET (2.5 MG TOTAL) BY MOUTH ONCE DAILY 45 tablet 3 calcium citrate-vitamin D3 (CITRACAL+D) 315 mg-5 mcg (200 unit) tablet Take 2 tablets by mouth 2 (two) times daily with meals carbidopa-levodopa (SINEMET) 25-100 mg tablet Take 1 tablet  by mouth 3 (three) times daily 90 tablet 11 cyclobenzaprine (FLEXERIL) 5 MG tablet Take 5 mg by mouth 3 (three) times  daily denosumab (PROLIA) 60 mg/mL inj syringe Inject 1 mL (60 mg total) subcutaneously every 6 (six) months 60 mL 1 denosumab (PROLIA) 60 mg/mL inj syringe Inject 1 mL (60 mg total) subcutaneously every 6 (six) months 60 mL 1 fluticasone propionate (FLONASE) 50 mcg/actuation nasal spray Place 1 spray into both nostrils once daily as needed gabapentin (NEURONTIN) 300 MG capsule Take 300 mg by mouth at bedtime levocetirizine (XYZAL) 5 MG tablet Take 5 mg by mouth every evening. magnesium 30 mg tablet Take 250 mg by mouth once daily ondansetron (ZOFRAN) 4 MG tablet Take 1 tablet (4 mg total) by mouth every 8 (eight) hours as needed for Nausea 20 tablet 2 rosuvastatin (CRESTOR) 5 MG tablet TAKE 1 TABLET BY MOUTH ONCE DAILY. 30 tablet 11  No current facility-administered medications on file prior to visit.  Past Medical History: Past Medical History: Diagnosis Date Allergic state Arrhythmia PVC s Arthritis Osteoarthritis Bladder polyps Chronic kidney disease GFR 55? Fibrocystic breast disease Frequent UTI Sees Dr. Orson Slick History of cataract Very small, unripe History of degenerative disc disease Neck with history of chronic pain. Hypercholesterolemia Hypertension Osteoporosis, post-menopausal a. Fosamax. (GWK) Parkinson disease  Past Surgical History: Past Surgical History: Procedure Laterality Date TONSILLECTOMY AND ADENOIDECTOMY 1978 REMOVAL OVARIAN CYST Left 1985 DILATION AND CURETTAGE, DIAGNOSTIC / THERAPEUTIC 1998 COLONOSCOPY 01/01/2013 Dr. Demetrius Charity. Oh @ [facility] - Nml, FHCC(b)(55), rpt 5 yrs per [facility] COLONOSCOPY N/A 07/12/2018 Dr. Leavy Cella @ [facility] - Adenomatous Polyps, one TVA, one 10mm, FHCC(b), 3 yr rpt per [facility] (08/27/2021 Recall returned.awb) Colon @ [facility] 03/31/2022 Normal examined colon/PHx CP/No repeat due to age/TKT EGD @ [facility] 03/31/2022 Gastritis/Esophageal stenosis.Dilated/Otherwise normal EGD biopsy/No Repeat/TKT Bladder fulguration 2003; 1993 HYSTERECTOMY TUBAL  LIGATION 1989  Family History: Family History Problem Relation Name Age of Onset Hyperlipidemia (Elevated cholesterol) Mother Levon Hedger Anxiety Mother Levon Hedger High blood pressure (Hypertension) Mother Levon Hedger Skin cancer Mother Levon Hedger Prostate cancer Father Herschell Dimes Hyperlipidemia (Elevated cholesterol) Father Herschell Dimes Diabetes type II Father J Richard Darcella Gasman Allergic rhinitis Father J Ali Lowe High blood pressure (Hypertension) Father Herschell Dimes Colon cancer Brother Suzzanne Cloud [place]  Social History: Social History  Socioeconomic History Marital status: Married Occupational History Occupation: Retired Tobacco Use Smoking status: Former Current packs/day: 0.00 Types: Cigarettes Start date: 06/11/1972 Quit date: 06/11/1973 Years since quitting: 50.0 Smokeless tobacco: Never Tobacco comments: Dabbled while in college Vaping Use Vaping status: Never Used Substance and Sexual Activity Alcohol use: Yes Alcohol/week: 1.0 standard drink of alcohol Types: 1 Glasses of wine per week Comment: Twice weekly Drug use: No Sexual activity: Yes Partners: Male Comment: S/p hysterectomy + menopause  Social Determinants of Health  Financial Resource Strain: Low Risk (01/28/2023) Overall Financial Resource Strain (CARDIA) Difficulty of Paying Living Expenses: Not hard at all Food Insecurity: No Food Insecurity (01/28/2023) Hunger Vital Sign Worried About Running Out of Food in the Last Year: Never true Ran Out of Food in the Last Year: Never true Transportation Needs: No Transportation Needs (01/28/2023) PRAPARE - Contractor (Medical): No Lack of Transportation (Non-Medical): No Housing Stability: Unknown (01/28/2023) Housing Stability Vital Sign Unable to Pay for Housing in the Last Year: No Homeless in the Last Year: No  Allergies: Allergies Allergen Reactions Tymlos [Abaloparatide]  Other (See Comments) Constipation,headache, nausea  Parts of this  note were created with use of Dragon dictation services. Please note any typographical errors are unintentional. I apologize for any typographical errors that were not detected and corrected.  Return in about 6 months (around 12/14/2023). The patient will contact us earlier if severity of neurologic symptoms increases or changes. This may or may not, depending on description of symptoms, necessitate an earlier appointment.  Payor: [employer] MEDICARE ADVANTAGE / Plan: [employer] PPO MEDICARE ADVANTAGE / Product Type: PPO /  Attestation Statement:  I personally performed the service, non-incident to. (WP)  Lindsay Coffin, PA  Lindsay Coffin, PA-C Board Certified by [facility]   Electronically signed by Lindsay Coffin, PA at 06/29/2023 10:38 AM EDT

## 2023-10-07 NOTE — Pre-Procedure Instructions (Signed)
Progress Notes - documented in this encounter  Marcina Millard, MD - 03/03/2023 11:00 AM EDT Formatting of this note is different from the original. Established Patient Visit  Chief Complaint: Chief Complaint Patient presents with Follow-up 1 year Date of Service: 03/03/2023 Date of Birth: November 21, 1950 PCP: Allyn Kenner, MD History of Present Illness: Ms. Dyke is a 72 y.o.female patient with a history of premature ventricular contractions, hyperlipidemia, and syncope. The patient reported an episode of possible syncope that occurred in the setting of a long-standing upper respiratory infection. She reported that she was standing in her kitchen when she acutely developed nausea, lightheadedness, and unsteadiness, and fell to the ground, bruising her knee. She did not hit her head, and she is unsure if she lost consciousness. She states that during the duration of her upper respiratory infection, she had been feeling intermittent weakness and lightheadedness. An episode of syncope occurred almost 1 year later, again in the setting of a URI, also with prodromal symptoms.  Patient returns for follow-up, reports "doing well". She denies exertional chest pain or shortness of breath. She reports occasional brief flutter. She denies peripheral edema. She denies recurrent presyncope or syncope. The patient is active, does morning stretches. She was recently diagnosed with Parkinson's, started on carbidopa-levodopa, followed by Dr.Shah.  The patient has hyperlipidemia, LDL 81 on 01/21/2023, on rosuvastatin, which is well-tolerated without apparent side effects, followed by her primary care provider. Patient follows a low-cholesterol, low-fat diet.  Past Medical and Surgical History Past Medical History Past Medical History: Diagnosis Date Allergic state Arrhythmia PVC s Arthritis Osteoarthritis Bladder polyps Chronic kidney disease GFR 55? Fibrocystic breast disease Frequent  UTI Sees Dr. Orson Slick History of cataract Very small, unripe History of degenerative disc disease Neck with history of chronic pain. Hypercholesterolemia Hypertension Osteoporosis, post-menopausal a. Fosamax. [facility])  Past Surgical History She has a past surgical history that includes Hysterectomy; Dilation and curettage, diagnostic / therapeutic (1998); Bladder fulguration (2003; 1993); Removal Ovarian Cyst (Left, 1985); Tonsillectomy and adenoidectomy (1978); Colonoscopy (01/01/2013); Colonoscopy (N/A, 07/12/2018); Tubal ligation (1989); Colon @ [facility] (03/31/2022); and EGD @ [facility] (03/31/2022).  Medications and Allergies Current Medications  Current Outpatient Medications Medication Sig Dispense Refill acetaminophen (TYLENOL) 500 MG tablet Take 1,000 mg by mouth every 6 (six) hours as needed azelastine (ASTELIN) 137 mcg nasal spray Place 1 spray into both nostrils once daily as needed 3 bisoprolol (ZEBETA) 5 MG tablet TAKE 1/2 TABLET (2.5 MG TOTAL) BY MOUTH ONCE DAILY 45 tablet 3 calcium citrate-vitamin D3 (CITRACAL+D) 315 mg-5 mcg (200 unit) tablet Take 2 tablets by mouth 2 (two) times daily with meals carbidopa-levodopa (SINEMET) 25-100 mg tablet Take 1 tablet by mouth 3 (three) times daily 90 tablet 11 cyclobenzaprine (FLEXERIL) 5 MG tablet Take 5 mg by mouth 3 (three) times daily fluticasone propionate (FLONASE) 50 mcg/actuation nasal spray Place 1 spray into both nostrils once daily as needed gabapentin (NEURONTIN) 300 MG capsule Take 300 mg by mouth at bedtime levocetirizine (XYZAL) 5 MG tablet Take 5 mg by mouth every evening. magnesium 30 mg tablet Take 250 mg by mouth once daily raloxifene (EVISTA) 60 mg tablet TAKE 1 TABLET BY MOUTH DAILY 90 tablet 1 rosuvastatin (CRESTOR) 5 MG tablet TAKE 1 TABLET BY MOUTH ONCE DAILY. 30 tablet 11  No current facility-administered medications for this visit.  Allergies: Patient has no known allergies.  Social and Family History Social  History reports that she quit smoking about 49 years ago. Her smoking use included cigarettes. She started  smoking about 50 years ago. She has never used smokeless tobacco. She reports current alcohol use of about 1.0 standard drink of alcohol per week. She reports that she does not use drugs.  Family History Family History Problem Relation Name Age of Onset Hyperlipidemia (Elevated cholesterol) Mother Levon Hedger Anxiety Mother Levon Hedger High blood pressure (Hypertension) Mother Levon Hedger Skin cancer Mother Levon Hedger Prostate cancer Father Herschell Dimes Hyperlipidemia (Elevated cholesterol) Father Herschell Dimes Diabetes type II Father J Ali Lowe Allergic rhinitis Father Herschell Dimes High blood pressure (Hypertension) Father Herschell Dimes Colon cancer Brother Suzzanne Cloud [employer]ice[place]  Review of Systems  Review of Systems: The patient denies exertional chest pain, with occasional dyspepsia, without shortness of breath, orthopnea, paroxysmal nocturnal dyspnea, pedal edema, reports occasional palpitations, heart racing, with syncope, with chronic hip pain. Review of 10 Systems is negative except as described above.  Physical Examination  Vitals: BP 122/78  Pulse 71  Ht 167.6 cm (5\' 6" )  Wt 65.3 kg (144 lb)  LMP (LMP Unknown)  SpO2 99%  BMI 23.24 kg/m Ht:167.6 cm (5\' 6" ) Wt:65.3 kg (144 lb) WUJ:WJXB surface area is 1.74 meters squared. Body mass index is 23.24 kg/m.  General: Alert and oriented. Well-appearing. No acute distress. HEENT: Pupils equally reactive to light and accomodation Neck: Supple, no JVD Lungs: Normal effort of breathing; clear to auscultation bilaterally; no wheezes, rales, rhonchi Heart: Regular rate and rhythm. No murmur, rub, or gallop Abdomen: nondistended, with normal bowel sounds Extremities: no cyanosis, clubbing, or edema Peripheral Pulses: 2+ radial Skin: Warm, dry, no diaphoresis Psych: Good affect,  responds appropriately  Assessment  72 y.o. female with 1. Frequent PVCs 2. Hyperlipidemia, unspecified hyperlipidemia type 3. Parkinson's disease without dyskinesia or fluctuating manifestations 4. Stage 3a chronic kidney disease (CMS/HHS-HCC)  72 year old female with frequent premature ventricular contractions, appears to be well controlled on bisoprolol. The patient had an episode of possible unwitnessed syncope, in the setting of a long-lasting URI with prodromal lightheadedness and unsteadiness. She is unsure if she lost consciousness, but did lose lower body strength and fell to the ground. She had a similar episode of syncope, also while she had a URI, with prodromal symptoms. She returns today, denies recurrent presyncope or syncope.  Plan  1. Continue current medications 2. Counseled patient about low-cholesterol diet 3. Low-fat and cholesterol diet printed instructions given to the patient 4. Return to clinic for follow-up in 1 year  No orders of the defined types were placed in this encounter.  Return in about 1 year (around 03/02/2024).  Marcina Millard, MD PhD [facility]   Electronically signed by Marcina Millard, MD at 03/03/2023 2:03 PM EDT

## 2023-10-16 MED ORDER — VANCOMYCIN HCL IN DEXTROSE 1-5 GM/200ML-% IV SOLN
1000.0000 mg | Freq: Once | INTRAVENOUS | Status: AC
  Administered 2023-10-17: 1000 mg via INTRAVENOUS

## 2023-10-16 MED ORDER — CEFAZOLIN IN SODIUM CHLORIDE 2-0.9 GM/100ML-% IV SOLN
2.0000 g | Freq: Once | INTRAVENOUS | Status: DC
Start: 2023-10-16 — End: 2023-10-16
  Filled 2023-10-16: qty 100

## 2023-10-16 MED ORDER — CHLORHEXIDINE GLUCONATE 0.12 % MT SOLN
15.0000 mL | Freq: Once | OROMUCOSAL | Status: AC
  Administered 2023-10-17: 15 mL via OROMUCOSAL

## 2023-10-16 MED ORDER — ORAL CARE MOUTH RINSE: 15.0000 mL | Freq: Once | OROMUCOSAL | Status: AC

## 2023-10-16 MED ORDER — CEFAZOLIN SODIUM-DEXTROSE 2-4 GM/100ML-% IV SOLN
2.0000 g | INTRAVENOUS | Status: AC
  Administered 2023-10-17: 2 g via INTRAVENOUS

## 2023-10-16 MED ORDER — LACTATED RINGERS IV SOLN: INTRAVENOUS | Status: DC

## 2023-10-17 ENCOUNTER — Inpatient Hospital Stay: Payer: Medicare HMO

## 2023-10-17 ENCOUNTER — Inpatient Hospital Stay: Payer: Medicare HMO | Admitting: Certified Registered Nurse Anesthetist

## 2023-10-17 ENCOUNTER — Encounter: Payer: Self-pay | Admitting: Neurosurgery

## 2023-10-17 ENCOUNTER — Inpatient Hospital Stay
Admission: RE | Admit: 2023-10-17 | Discharge: 2023-10-17 | DRG: 497 | Disposition: A | Discharge status: Home / Self Care | Payer: Medicare HMO | Attending: Neurosurgery | Admitting: Neurosurgery

## 2023-10-17 ENCOUNTER — Other Ambulatory Visit: Payer: Self-pay

## 2023-10-17 ENCOUNTER — Encounter
Admission: RE | Disposition: A | Discharge status: Home / Self Care | Payer: Self-pay | Source: Home / Self Care | Attending: Neurosurgery

## 2023-10-17 DIAGNOSIS — E7800 Pure hypercholesterolemia, unspecified: Secondary | ICD-10-CM | POA: Diagnosis present

## 2023-10-17 DIAGNOSIS — Y792 Prosthetic and other implants, materials and accessory orthopedic devices associated with adverse incidents: Secondary | ICD-10-CM | POA: Diagnosis present

## 2023-10-17 DIAGNOSIS — Z981 Arthrodesis status: Principal | ICD-10-CM

## 2023-10-17 DIAGNOSIS — T84226A Displacement of internal fixation device of vertebrae, initial encounter: Secondary | ICD-10-CM | POA: Diagnosis present

## 2023-10-17 DIAGNOSIS — M4316 Spondylolisthesis, lumbar region: Secondary | ICD-10-CM

## 2023-10-17 DIAGNOSIS — M810 Age-related osteoporosis without current pathological fracture: Secondary | ICD-10-CM | POA: Diagnosis present

## 2023-10-17 DIAGNOSIS — Z87440 Personal history of urinary (tract) infections: Secondary | ICD-10-CM

## 2023-10-17 DIAGNOSIS — Z888 Allergy status to other drugs, medicaments and biological substances status: Secondary | ICD-10-CM

## 2023-10-17 DIAGNOSIS — T84498A Other mechanical complication of other internal orthopedic devices, implants and grafts, initial encounter: Secondary | ICD-10-CM

## 2023-10-17 DIAGNOSIS — Z01818 Encounter for other preprocedural examination: Secondary | ICD-10-CM

## 2023-10-17 HISTORY — DX: Other mechanical complication of other internal orthopedic devices, implants and grafts, initial encounter: T84.498A

## 2023-10-17 HISTORY — PX: PARS PLANA VITRECTOMY 27 GAUGE WITH 25 GAUGE PORT: SHX6740

## 2023-10-17 SURGERY — ADJUSTMENT, HARDWARE, SPINE
Anesthesia: General | Site: Spine Lumbar | Laterality: Left

## 2023-10-17 MED ORDER — ACETAMINOPHEN 10 MG/ML IV SOLN: 1000.0000 mg | Freq: Once | INTRAVENOUS | Status: DC | PRN

## 2023-10-17 MED ORDER — OXYCODONE HCL 5 MG PO TABS
ORAL_TABLET | ORAL | Status: AC
  Filled 2023-10-17: qty 1

## 2023-10-17 MED ORDER — OXYCODONE HCL 5 MG PO TABS: 10.0000 mg | ORAL_TABLET | ORAL | Status: DC | PRN

## 2023-10-17 MED ORDER — PHENYLEPHRINE HCL-NACL 20-0.9 MG/250ML-% IV SOLN
INTRAVENOUS | Status: AC
  Filled 2023-10-17: qty 250

## 2023-10-17 MED ORDER — SUCCINYLCHOLINE CHLORIDE 200 MG/10ML IV SOSY
PREFILLED_SYRINGE | INTRAVENOUS | Status: DC | PRN
  Administered 2023-10-17: 80 mg via INTRAVENOUS

## 2023-10-17 MED ORDER — FLUTICASONE PROPIONATE 50 MCG/ACT NA SUSP: 2.0000 | Freq: Two times a day (BID) | NASAL | Status: DC | PRN

## 2023-10-17 MED ORDER — FENTANYL CITRATE (PF) 100 MCG/2ML IJ SOLN: 25.0000 ug | INTRAMUSCULAR | Status: DC | PRN

## 2023-10-17 MED ORDER — METHOCARBAMOL 500 MG PO TABS: 500.0000 mg | ORAL_TABLET | Freq: Three times a day (TID) | ORAL | 0 refills | Status: DC | PRN

## 2023-10-17 MED ORDER — VANCOMYCIN HCL IN DEXTROSE 1-5 GM/200ML-% IV SOLN
INTRAVENOUS | Status: AC
Start: 2023-10-17 — End: ?
  Filled 2023-10-17: qty 200

## 2023-10-17 MED ORDER — PROPOFOL 10 MG/ML IV BOLUS
INTRAVENOUS | Status: DC | PRN
  Administered 2023-10-17: 100 mg via INTRAVENOUS

## 2023-10-17 MED ORDER — METHOCARBAMOL 1000 MG/10ML IJ SOLN: 500.0000 mg | Freq: Four times a day (QID) | INTRAMUSCULAR | Status: DC | PRN

## 2023-10-17 MED ORDER — DROPERIDOL 2.5 MG/ML IJ SOLN: 0.6250 mg | Freq: Once | INTRAMUSCULAR | Status: DC | PRN

## 2023-10-17 MED ORDER — ACETAMINOPHEN 10 MG/ML IV SOLN
INTRAVENOUS | Status: AC
  Filled 2023-10-17: qty 100

## 2023-10-17 MED ORDER — LEVOCETIRIZINE DIHYDROCHLORIDE 5 MG PO TABS: 5.0000 mg | ORAL_TABLET | Freq: Every evening | ORAL | Status: DC

## 2023-10-17 MED ORDER — IRRISEPT - 450ML BOTTLE WITH 0.05% CHG IN STERILE WATER, USP 99.95% OPTIME
TOPICAL | Status: DC | PRN
  Administered 2023-10-17: 450 mL

## 2023-10-17 MED ORDER — SODIUM CHLORIDE FLUSH 0.9 % IV SOLN
INTRAVENOUS | Status: AC
  Filled 2023-10-17: qty 20

## 2023-10-17 MED ORDER — LIDOCAINE HCL (CARDIAC) PF 100 MG/5ML IV SOSY
PREFILLED_SYRINGE | INTRAVENOUS | Status: DC | PRN
  Administered 2023-10-17: 80 mg via INTRAVENOUS

## 2023-10-17 MED ORDER — MENTHOL 3 MG MT LOZG: 1.0000 | LOZENGE | OROMUCOSAL | Status: DC | PRN

## 2023-10-17 MED ORDER — PHENOL 1.4 % MT LIQD: 1.0000 | OROMUCOSAL | Status: DC | PRN

## 2023-10-17 MED ORDER — EPHEDRINE 5 MG/ML INJ
INTRAVENOUS | Status: AC
  Filled 2023-10-17: qty 5

## 2023-10-17 MED ORDER — SODIUM CHLORIDE 0.9 % IV SOLN
INTRAVENOUS | Status: DC | PRN
  Administered 2023-10-17: .05 ug/kg/min via INTRAVENOUS

## 2023-10-17 MED ORDER — ONDANSETRON HCL 4 MG/2ML IJ SOLN
INTRAMUSCULAR | Status: AC
Start: 2023-10-17 — End: ?
  Filled 2023-10-17: qty 2

## 2023-10-17 MED ORDER — GABAPENTIN 300 MG PO CAPS
300.0000 mg | ORAL_CAPSULE | Freq: Two times a day (BID) | ORAL | Status: DC
Start: 2023-10-17 — End: 2023-10-17

## 2023-10-17 MED ORDER — FENTANYL CITRATE (PF) 100 MCG/2ML IJ SOLN
INTRAMUSCULAR | Status: DC | PRN
  Administered 2023-10-17 (×2): 50 ug via INTRAVENOUS

## 2023-10-17 MED ORDER — DEXAMETHASONE SODIUM PHOSPHATE 10 MG/ML IJ SOLN
INTRAMUSCULAR | Status: DC | PRN
  Administered 2023-10-17: 10 mg via INTRAVENOUS

## 2023-10-17 MED ORDER — VITAMIN D (CHOLECALCIFEROL) 50 MCG (2000 UT) PO CAPS: 2000.0000 [IU] | ORAL_CAPSULE | ORAL | Status: DC

## 2023-10-17 MED ORDER — OXYCODONE HCL 5 MG/5ML PO SOLN: 5.0000 mg | Freq: Once | ORAL | Status: AC | PRN

## 2023-10-17 MED ORDER — ENOXAPARIN SODIUM 40 MG/0.4ML IJ SOSY: 40.0000 mg | PREFILLED_SYRINGE | INTRAMUSCULAR | Status: DC

## 2023-10-17 MED ORDER — OXYCODONE HCL 5 MG PO TABS
5.0000 mg | ORAL_TABLET | Freq: Once | ORAL | Status: AC | PRN
Start: 2023-10-17 — End: 2023-10-17
  Administered 2023-10-17: 5 mg via ORAL

## 2023-10-17 MED ORDER — SURGIFLO WITH THROMBIN (HEMOSTATIC MATRIX KIT) OPTIME: TOPICAL | Status: DC | PRN

## 2023-10-17 MED ORDER — ACETAMINOPHEN 10 MG/ML IV SOLN
INTRAVENOUS | Status: DC | PRN
  Administered 2023-10-17: 1000 mg via INTRAVENOUS

## 2023-10-17 MED ORDER — BUPIVACAINE HCL (PF) 0.5 % IJ SOLN
INTRAMUSCULAR | Status: AC
  Filled 2023-10-17: qty 30

## 2023-10-17 MED ORDER — PHENYLEPHRINE HCL-NACL 20-0.9 MG/250ML-% IV SOLN
INTRAVENOUS | Status: DC | PRN
  Administered 2023-10-17: 35 ug/min via INTRAVENOUS

## 2023-10-17 MED ORDER — CARBIDOPA-LEVODOPA 25-100 MG PO TABS: 1.0000 | ORAL_TABLET | Freq: Three times a day (TID) | ORAL | Status: DC

## 2023-10-17 MED ORDER — CEFAZOLIN SODIUM-DEXTROSE 2-4 GM/100ML-% IV SOLN
INTRAVENOUS | Status: AC
  Filled 2023-10-17: qty 100

## 2023-10-17 MED ORDER — FENTANYL CITRATE (PF) 100 MCG/2ML IJ SOLN
INTRAMUSCULAR | Status: AC
  Filled 2023-10-17: qty 2

## 2023-10-17 MED ORDER — LORATADINE 10 MG PO TABS: 10.0000 mg | ORAL_TABLET | Freq: Every day | ORAL | Status: DC

## 2023-10-17 MED ORDER — MORPHINE SULFATE (PF) 2 MG/ML IV SOLN: 1.0000 mg | INTRAVENOUS | Status: DC | PRN

## 2023-10-17 MED ORDER — POLYETHYLENE GLYCOL 3350 17 G PO PACK: 17.0000 g | PACK | Freq: Every day | ORAL | Status: DC | PRN

## 2023-10-17 MED ORDER — CHLORHEXIDINE GLUCONATE 0.12 % MT SOLN
OROMUCOSAL | Status: AC
Start: 2023-10-17 — End: ?
  Filled 2023-10-17: qty 15

## 2023-10-17 MED ORDER — ONDANSETRON HCL 4 MG/2ML IJ SOLN
INTRAMUSCULAR | Status: DC | PRN
  Administered 2023-10-17: 4 mg via INTRAVENOUS

## 2023-10-17 MED ORDER — REMIFENTANIL HCL 1 MG IV SOLR
INTRAVENOUS | Status: AC
  Filled 2023-10-17: qty 1000

## 2023-10-17 MED ORDER — ONDANSETRON HCL 4 MG PO TABS: 4.0000 mg | ORAL_TABLET | Freq: Four times a day (QID) | ORAL | Status: DC | PRN

## 2023-10-17 MED ORDER — ACETAMINOPHEN 500 MG PO TABS
1000.0000 mg | ORAL_TABLET | Freq: Once | ORAL | Status: AC
  Administered 2023-10-17: 1000 mg via ORAL

## 2023-10-17 MED ORDER — BISOPROLOL FUMARATE 5 MG PO TABS: 2.5000 mg | ORAL_TABLET | Freq: Every day | ORAL | Status: DC

## 2023-10-17 MED ORDER — OXYCODONE HCL 5 MG PO TABS: 5.0000 mg | ORAL_TABLET | ORAL | Status: DC | PRN

## 2023-10-17 MED ORDER — MIDAZOLAM HCL 2 MG/2ML IJ SOLN
INTRAMUSCULAR | Status: AC
  Filled 2023-10-17: qty 2

## 2023-10-17 MED ORDER — DEXAMETHASONE SODIUM PHOSPHATE 10 MG/ML IJ SOLN
INTRAMUSCULAR | Status: AC
  Filled 2023-10-17: qty 1

## 2023-10-17 MED ORDER — ACETAMINOPHEN 650 MG RE SUPP: 650.0000 mg | RECTAL | Status: DC | PRN

## 2023-10-17 MED ORDER — METHOCARBAMOL 500 MG PO TABS: 500.0000 mg | ORAL_TABLET | Freq: Four times a day (QID) | ORAL | Status: DC | PRN

## 2023-10-17 MED ORDER — SODIUM CHLORIDE (PF) 0.9 % IJ SOLN
INTRAMUSCULAR | Status: DC | PRN
  Administered 2023-10-17: 60 mL via INTRAMUSCULAR

## 2023-10-17 MED ORDER — BUPIVACAINE LIPOSOME 1.3 % IJ SUSP
INTRAMUSCULAR | Status: AC
  Filled 2023-10-17: qty 20

## 2023-10-17 MED ORDER — ACETAMINOPHEN 500 MG PO TABS
ORAL_TABLET | ORAL | Status: AC
  Filled 2023-10-17: qty 2

## 2023-10-17 MED ORDER — MAGNESIUM CITRATE PO SOLN: 1.0000 | Freq: Once | ORAL | Status: DC | PRN

## 2023-10-17 MED ORDER — ONDANSETRON HCL 4 MG/2ML IJ SOLN: 4.0000 mg | Freq: Four times a day (QID) | INTRAMUSCULAR | Status: DC | PRN

## 2023-10-17 MED ORDER — KETOROLAC TROMETHAMINE 30 MG/ML IJ SOLN
INTRAMUSCULAR | Status: AC
  Filled 2023-10-17: qty 1

## 2023-10-17 MED ORDER — SODIUM CHLORIDE 0.9 % IV SOLN: 250.0000 mL | INTRAVENOUS | Status: DC

## 2023-10-17 MED ORDER — KETOROLAC TROMETHAMINE 30 MG/ML IJ SOLN
INTRAMUSCULAR | Status: DC | PRN
  Administered 2023-10-17: 15 mg via INTRAVENOUS

## 2023-10-17 MED ORDER — GABAPENTIN 300 MG PO CAPS
ORAL_CAPSULE | ORAL | Status: AC
  Filled 2023-10-17: qty 1

## 2023-10-17 MED ORDER — 0.9 % SODIUM CHLORIDE (POUR BTL) OPTIME
TOPICAL | Status: DC | PRN
  Administered 2023-10-17: 500 mL

## 2023-10-17 MED ORDER — SODIUM CHLORIDE 0.9% FLUSH: 3.0000 mL | INTRAVENOUS | Status: DC | PRN

## 2023-10-17 MED ORDER — SORBITOL 70 % SOLN: 30.0000 mL | Freq: Every day | Status: DC | PRN

## 2023-10-17 MED ORDER — DOCUSATE SODIUM 100 MG PO CAPS: 100.0000 mg | ORAL_CAPSULE | Freq: Two times a day (BID) | ORAL | Status: DC

## 2023-10-17 MED ORDER — ROSUVASTATIN CALCIUM 5 MG PO TABS: 5.0000 mg | ORAL_TABLET | Freq: Every day | ORAL | Status: DC

## 2023-10-17 MED ORDER — MIDAZOLAM HCL 2 MG/2ML IJ SOLN
INTRAMUSCULAR | Status: DC | PRN
  Administered 2023-10-17: 2 mg via INTRAVENOUS

## 2023-10-17 MED ORDER — MIDAZOLAM HCL 2 MG/2ML IJ SOLN
INTRAMUSCULAR | Status: AC
Start: 2023-10-17 — End: ?
  Filled 2023-10-17: qty 2

## 2023-10-17 MED ORDER — EPHEDRINE SULFATE-NACL 50-0.9 MG/10ML-% IV SOSY
PREFILLED_SYRINGE | INTRAVENOUS | Status: DC | PRN
  Administered 2023-10-17: 10 mg via INTRAVENOUS

## 2023-10-17 MED ORDER — PROPOFOL 10 MG/ML IV BOLUS
INTRAVENOUS | Status: AC
  Filled 2023-10-17: qty 20

## 2023-10-17 MED ORDER — OXYCODONE HCL 5 MG PO TABS: 5.0000 mg | ORAL_TABLET | ORAL | 0 refills | Status: AC | PRN

## 2023-10-17 MED ORDER — ACETAMINOPHEN 325 MG PO TABS: 650.0000 mg | ORAL_TABLET | ORAL | Status: DC | PRN

## 2023-10-17 MED ORDER — SENNA 8.6 MG PO TABS
1.0000 | ORAL_TABLET | Freq: Two times a day (BID) | ORAL | Status: DC
Start: 2023-10-17 — End: 2023-10-17

## 2023-10-17 MED ORDER — GABAPENTIN 300 MG PO CAPS: 300.0000 mg | ORAL_CAPSULE | Freq: Once | ORAL | Status: DC

## 2023-10-17 MED ORDER — BUPIVACAINE-EPINEPHRINE (PF) 0.5% -1:200000 IJ SOLN
INTRAMUSCULAR | Status: DC | PRN
  Administered 2023-10-17: 10 mL

## 2023-10-17 MED ORDER — ACETAMINOPHEN 500 MG PO TABS: 1000.0000 mg | ORAL_TABLET | Freq: Four times a day (QID) | ORAL | Status: DC

## 2023-10-17 SURGICAL SUPPLY — 35 items
BASIN KIT SINGLE STR (MISCELLANEOUS) ×1 IMPLANT
BUR NEURO DRILL SOFT 3.0X3.8M (BURR) ×1 IMPLANT
DERMABOND ADVANCED .7 DNX12 (GAUZE/BANDAGES/DRESSINGS) ×1 IMPLANT
DRAPE LAPAROTOMY 100X77 ABD (DRAPES) ×1 IMPLANT
DRSG OPSITE POSTOP 4X6 (GAUZE/BANDAGES/DRESSINGS) IMPLANT
ELECT REM PT RETURN 9FT ADLT (ELECTROSURGICAL) ×1
ELECTRODE REM PT RTRN 9FT ADLT (ELECTROSURGICAL) ×1 IMPLANT
GLOVE BIOGEL PI IND STRL 6.5 (GLOVE) ×1 IMPLANT
GLOVE SURG SYN 6.5 ES PF (GLOVE) ×2
GLOVE SURG SYN 6.5 PF PI (GLOVE) ×2 IMPLANT
GLOVE SURG SYN 8.5 E (GLOVE) ×3
GLOVE SURG SYN 8.5 PF PI (GLOVE) ×3 IMPLANT
GOWN SRG LRG LVL 4 IMPRV REINF (GOWNS) ×1 IMPLANT
GOWN SRG XL LVL 3 NONREINFORCE (GOWNS) ×1 IMPLANT
HOLDER FOLEY CATH W/STRAP (MISCELLANEOUS) ×1 IMPLANT
JET LAVAGE IRRISEPT WOUND (IRRIGATION / IRRIGATOR) ×1
KIT PREVENA INCISION MGT 13 (CANNISTER) ×1 IMPLANT
KIT SPINAL PRONEVIEW (KITS) ×1 IMPLANT
LAVAGE JET IRRISEPT WOUND (IRRIGATION / IRRIGATOR) ×1 IMPLANT
MANIFOLD NEPTUNE II (INSTRUMENTS) ×1 IMPLANT
MARKER SKIN DUAL TIP RULER LAB (MISCELLANEOUS) ×1 IMPLANT
NDL SAFETY ECLIPSE 18X1.5 (NEEDLE) ×1 IMPLANT
NS IRRIG 1000ML POUR BTL (IV SOLUTION) ×1 IMPLANT
NS IRRIG 500ML POUR BTL (IV SOLUTION) IMPLANT
PACK LAMINECTOMY ARMC (PACKS) ×1 IMPLANT
PAD ARMBOARD 7.5X6 YLW CONV (MISCELLANEOUS) ×1 IMPLANT
SCREW LOCK RELINE 5.5 TULIP (Screw) IMPLANT
SURGIFLO W/THROMBIN 8M KIT (HEMOSTASIS) ×1 IMPLANT
SUT STRATA 3-0 15 PS-2 (SUTURE) ×1 IMPLANT
SUT VIC AB 0 CT1 27XCR 8 STRN (SUTURE) ×2 IMPLANT
SUT VIC AB 2-0 CT1 18 (SUTURE) ×2 IMPLANT
SYR 10ML LL (SYRINGE) ×1 IMPLANT
SYR 30ML LL (SYRINGE) ×2 IMPLANT
TRAP FLUID SMOKE EVACUATOR (MISCELLANEOUS) ×1 IMPLANT
WATER STERILE IRR 1000ML POUR (IV SOLUTION) ×2 IMPLANT

## 2023-10-17 NOTE — Anesthesia Preprocedure Evaluation (Addendum)
 Anesthesia Evaluation  Patient identified by MRN, date of birth, ID band Patient awake    Reviewed: Allergy & Precautions, H&P , NPO status , Patient's Chart, lab work & pertinent test results, reviewed documented beta blocker date and time   Airway Mallampati: III   Neck ROM: full    Dental  (+) Poor Dentition, Dental Advidsory Given   Pulmonary neg pulmonary ROS   Pulmonary exam normal        Cardiovascular Normal cardiovascular exam+ dysrhythmias  Rhythm:regular Rate:Normal     Neuro/Psych  Neuromuscular disease  negative psych ROS   GI/Hepatic negative GI ROS, Neg liver ROS,,,  Endo/Other  negative endocrine ROS    Renal/GU Renal disease     Musculoskeletal   Abdominal Normal abdominal exam  (+)   Peds  Hematology negative hematology ROS (+)   Anesthesia Other Findings Pt is s/p 08/10/23  L5-S1 TLIF  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 No date: Arthritis No date: Bladder polyps No date: Dysrhythmia     Comment:  PVC's No date: Fibrocystic breast disease No date: Frequent UTI No date: H/O degenerative disc disease No date: Hypercholesterolemia No date: Osteoporosis Past Surgical History: No date: ABDOMINAL HYSTERECTOMY No date: BLADDER FULGURATION No date: COLONOSCOPY 07/12/2018: COLONOSCOPY WITH PROPOFOL ; N/A     Comment:  Procedure: COLONOSCOPY WITH PROPOFOL ;  Surgeon: [place],               Ladell POUR, MD;  Location: [facility] ENDOSCOPY;  Service:               Gastroenterology;  Laterality: N/A; No date: DILATION AND CURETTAGE OF UTERUS No date: OVARIAN CYST REMOVAL No date: TONSILLECTOMY AND ADENOIDECTOMY BMI    Body Mass Index: 24.86 kg/m     Reproductive/Obstetrics negative OB ROS                             Anesthesia Physical Anesthesia Plan  ASA: 2  Anesthesia Plan: General ETT and General   Post-op Pain Management: Tylenol  PO (pre-op)*,  Regional block* and Toradol  IV (intra-op)*   Induction: Intravenous  PONV Risk Score and Plan: 3 and Ondansetron , Midazolam  and Dexamethasone   Airway Management Planned: Oral ETT  Additional Equipment:   Intra-op Plan:   Post-operative Plan: Extubation in OR  Informed Consent: I have reviewed the patients History and Physical, chart, labs and discussed the procedure including the risks, benefits and alternatives for the proposed anesthesia with the patient or authorized representative who has indicated his/her understanding and acceptance.     Dental Advisory Given  Plan Discussed with: Anesthesiologist, CRNA and Surgeon  Anesthesia Plan Comments:        Anesthesia Quick Evaluation

## 2023-10-17 NOTE — Discharge Summary (Signed)
 Discharge Summary  Patient ID: Lindsay Fields MRN: [id number] DOB/AGE: November 27, 1950 73 y.o.  Admit date: 10/17/2023 Discharge date: 10/17/2023  Admission Diagnoses: T84.498A Failed orthopedic implant, initial encounter   Discharge Diagnoses:  Principal Problem:   S/P lumbar fusion Active Problems:   Mechanical complication of internal orthopedic implant [facility])   Discharged Condition: good  Hospital Course:  Lindsay Fields is a very pleasant 73 y.o presenting with failed left sided rod s/p repositioning. Her intraoperative course was uncomplicated. She was monitored in PACU and discharged home after ambulating, urinating, and tolerating PO intake. She was given prescriptions for Oxycodone  and Robaxin  to take as needed.  Consults: None  Significant Diagnostic Studies: none  Treatments: surgery: as above. Please see separately dictated operative report for further details  Discharge Exam: Blood pressure (!) 109/94, pulse 66, temperature (!) 97.4 F (36.3 C), resp. rate 17, height 5' 6 (1.676 m), weight 63.9 kg, SpO2 100%. A&O  MAEW Incision covered with clean post-op dressing  Disposition: Discharge disposition: 01-Home or Self Care       Discharge Instructions     Incentive spirometry RT   Complete by: As directed       Allergies as of 10/17/2023       Reactions   Abaloparatide Other (See Comments)   Constipation,headache, nausea        Medication List     TAKE these medications    acetaminophen  500 MG tablet Commonly known as: TYLENOL  Take 1,000 mg by mouth every 6 (six) hours as needed.   bisoprolol  5 MG tablet Commonly known as: ZEBETA  Take 2.5 mg by mouth daily.   CALCIUM -VITAMIN D  PO Take 1 tablet by mouth daily. Calcium  750mg    carbidopa -levodopa  25-100 MG tablet Commonly known as: SINEMET  IR Take 1 tablet by mouth 3 (three) times daily.   denosumab 60 MG/ML Sosy injection Commonly known as: PROLIA Inject 60 mg into the skin every 6 (six)  months.   fluticasone  50 MCG/ACT nasal spray Commonly known as: FLONASE  Place 2 sprays into both nostrils 2 (two) times daily as needed for allergies or rhinitis.   gabapentin  300 MG capsule Commonly known as: Neurontin  Take 1 capsule (300 mg total) by mouth 2 (two) times daily.   levocetirizine 5 MG tablet Commonly known as: XYZAL  Take 5 mg by mouth every evening.   Magnesium  250 MG Tabs Take 250 mg by mouth daily as needed.   methocarbamol  500 MG tablet Commonly known as: ROBAXIN  Take 1 tablet (500 mg total) by mouth every 8 (eight) hours as needed for muscle spasms.   oxyCODONE  5 MG immediate release tablet Commonly known as: Oxy IR/ROXICODONE  Take 1 tablet (5 mg total) by mouth every 4 (four) hours as needed for up to 5 days for moderate pain (pain score 4-6) or severe pain (pain score 7-10).   rosuvastatin  5 MG tablet Commonly known as: CRESTOR  Take 5 mg by mouth daily after supper.   senna 8.6 MG Tabs tablet Commonly known as: SENOKOT Take 1 tablet (8.6 mg total) by mouth daily as needed for mild constipation.   vitamin D3 50 MCG (2000 UT) Caps Take 2,000 Units by mouth every other day.        Follow-up Information     Hilma Hastings, PA-C Follow up on 11/01/2023.   Specialty: Neurosurgery Contact information: [address] [place] [address] [phone]                 Signed: Edsel Ruth  Gregory 10/17/2023, 11:03 AM

## 2023-10-17 NOTE — Op Note (Signed)
 Indications: Ms. Elona Yinger is suffering from T84.498A Failed orthopedic implant, initial encounter . Due to displacement of the left L5/S1 rod, removal and repositioning was recommended.  Findings: successful removal and replacement of the rod.  Preoperative Diagnosis: T84.498A Failed orthopedic implant, initial encounter  Postoperative Diagnosis: same   EBL: 10 ml IVF: see AR ml Drains: none Disposition: Extubated and Stable to PACU Complications: none  No foley catheter was placed.   Preoperative Note:   Risks of surgery discussed include: infection, bleeding, stroke, coma, death, paralysis, CSF leak, nerve/spinal cord injury, numbness, tingling, weakness, complex regional pain syndrome, recurrent stenosis and/or disc herniation, vascular injury, development of instability, neck/back pain, need for further surgery, persistent symptoms, development of deformity, and the risks of anesthesia. The patient understood these risks and agreed to proceed.  Operative Note:  1. Removal, replacement, and repositioning of left L5/S1 rod    The patient was brought to the Operating Room, intubated and turned into the prone position. All pressure points were checked and double checked. Flouroscopy was used to mark the incision. The patient was prepped and draped in the standard fashion. A full timeout was performed. Preoperative antibiotics were given. The incision was injected with local anesthetic on the left.  The left incision was opened and the soft tissues divided with the Bovie. Self-retaining retractors were placed. The implants were located and exposed.  The locking caps and the rod were removed. The rod was then repositioned and secured.   The rods were secured using locking caps to manufacturer's specifications. Final AP and lateral radiographs were taken to confirm placement of instrumentation.  After hemostasis, the wound was closed in layers with 0 and 2-0 vicryl. 3-0 monocryl and  dermabond  was applied to the incision.  The patient was then flipped supine and extubated with incident. All counts were correct times 2 at the end of the case. No immediate complications were noted.  Edsel Goods PA assisted in the entire procedure. An assistant was required for this procedure due to the complexity.  The assistant provided assistance in tissue manipulation and suction, and was required for the successful and safe performance of the procedure. I performed the critical portions of the procedure.   Reeves Daisy MD

## 2023-10-17 NOTE — Anesthesia Postprocedure Evaluation (Signed)
 Anesthesia Post Note  Patient: Lindsay Fields  Procedure(s) Performed: REMOVAL & REPLACEMENT OF LEFT ROD AT L5-S1 (Left: Spine Lumbar)  Patient location during evaluation: PACU Anesthesia Type: General Level of consciousness: awake and alert Pain management: pain level controlled Vital Signs Assessment: post-procedure vital signs reviewed and stable Respiratory status: spontaneous breathing, nonlabored ventilation and respiratory function stable Cardiovascular status: blood pressure returned to baseline and stable Postop Assessment: no apparent nausea or vomiting Anesthetic complications: no   No notable events documented.   Last Vitals:  Vitals:   10/17/23 1121 10/17/23 1131  BP: (!) 141/68 128/76  Pulse: 65 (!) 57  Resp: 17 14  Temp: (!) 36.3 C (!) 36.2 C  SpO2: 98% 100%    Last Pain:  Vitals:   10/17/23 1131  TempSrc: Temporal  PainSc: 2                  Camellia Merilee Louder

## 2023-10-17 NOTE — Discharge Instructions (Addendum)
   Your surgeon has performed an operation on your lumbar spine (low back) to relieve pressure on one or more nerves. Many times, patients feel better immediately after surgery and can "overdo it." Even if you feel well, it is important that you follow these activity guidelines. If you do not let your back heal properly from the surgery, you can increase the chance of hardware complications and/or return of your symptoms. The following are instructions to help in your recovery once you have been discharged from the hospital.  Do not use NSAIDs for 3 months after surgery.   Activity    No bending, lifting, or twisting ("BLT"). Avoid lifting objects heavier than 10 pounds (gallon milk jug).  Where possible, avoid household activities that involve lifting, bending, pushing, or pulling such as laundry, vacuuming, grocery shopping, and childcare. Try to arrange for help from friends and family for these activities while your back heals.  Increase physical activity slowly as tolerated.  Taking short walks is encouraged, but avoid strenuous exercise. Do not jog, run, bicycle, lift weights, or participate in any other exercises unless specifically allowed by your doctor. Avoid prolonged sitting, including car rides.  Talk to your doctor before resuming sexual activity.  You should not drive until cleared by your doctor.  Until released by your doctor, you should not return to work or school.  You should rest at home and let your body heal.   You may shower three days after your surgery.  After showering, lightly dab your incision dry. Do not take a tub bath or go swimming for 3 weeks, or until approved by your doctor at your follow-up appointment.  If you smoke, we strongly recommend that you quit.  Smoking has been proven to interfere with normal healing in your back and will dramatically reduce the success rate of your surgery. Please contact [facility] (800-QUIT-NOW) and use the resources at  www.[facility].com for assistance in stopping smoking.  Surgical Incision   If you have a dressing on your incision, you may remove it three days after your surgery. Keep your incision area clean and dry.  Your incision was closed with Dermabond glue. The glue should begin to peel away within about a week.  Diet            You may return to your usual diet. Be sure to stay hydrated.  When to Contact us  Although your surgery and recovery will likely be uneventful, you may have some residual numbness, aches, and pains in your back and/or legs. This is normal and should improve in the next few weeks.  However, should you experience any of the following, contact us immediately: New numbness or weakness Pain that is progressively getting worse, and is not relieved by your pain medications or rest Bleeding, redness, swelling, pain, or drainage from surgical incision Chills or flu-like symptoms Fever greater than 101.0 F (38.3 C) Problems with bowel or bladder functions Difficulty breathing or shortness of breath Warmth, tenderness, or swelling in your calf  Contact Information How to contact us:  If you have any questions/concerns before or after surgery, you can reach [place] at [phone], or you can send a mychart message. We can be reached by phone or mychart 8am-4pm, Monday-Friday.  *Please note: Calls after 4pm are forwarded to a third party answering service. Mychart messages are not routinely monitored during evenings, weekends, and holidays. Please call our office to contact the answering service for urgent concerns during non-business hours.

## 2023-10-17 NOTE — Interval H&P Note (Signed)
 History and Physical Interval Note:  10/17/2023 8:40 AM  Lindsay Fields  has presented today for surgery, with the diagnosis of T84.498A Failed orthopedic implant, initial encounter.  The various methods of treatment have been discussed with the patient and family. After consideration of risks, benefits and other options for treatment, the patient has consented to  Procedure(s): REMOVAL & REPLACEMENT OF LEFT ROD AT L5-S1 (Left) as a surgical intervention.  The patient's history has been reviewed, patient examined, no change in status, stable for surgery.  I have reviewed the patient's chart and labs.  Questions were answered to the patient's satisfaction.    Heart sounds normal no MRG. Chest Clear to Auscultation Bilaterally.  Lindsay Fields

## 2023-10-17 NOTE — Transfer of Care (Signed)
 Immediate Anesthesia Transfer of Care Note  Patient: Lindsay Fields  Procedure(s) Performed: REMOVAL & REPLACEMENT OF LEFT ROD AT L5-S1 (Left: Spine Lumbar)  Patient Location: PACU  Anesthesia Type:General  Level of Consciousness: awake and alert   Airway & Oxygen Therapy: Patient Spontanous Breathing and Patient connected to face mask oxygen  Post-op Assessment: Report given to RN and Post -op Vital signs reviewed and stable  Post vital signs: Reviewed and stable  Last Vitals:  Vitals Value Taken Time  BP 129/64 10/17/23 1023  Temp 36.3 C 10/17/23 1023  Pulse 69 10/17/23 1027  Resp 17 10/17/23 1027  SpO2 100 % 10/17/23 1027  Vitals shown include unfiled device data.  Last Pain:  Vitals:   10/17/23 0821  TempSrc: Oral  PainSc: 4          Complications: No notable events documented.

## 2023-10-17 NOTE — Anesthesia Procedure Notes (Signed)
 Procedure Name: Intubation Date/Time: 10/17/2023 9:22 AM  Performed by: Duwayne Craven, CRNAPre-anesthesia Checklist: Patient identified, Patient being monitored, Timeout performed, Emergency Drugs available and Suction available Patient Re-evaluated:Patient Re-evaluated prior to induction Oxygen Delivery Method: Circle system utilized Preoxygenation: Pre-oxygenation with 100% oxygen Induction Type: IV induction Ventilation: Mask ventilation without difficulty Laryngoscope Size: 3 and McGrath Grade View: Grade I Tube type: Oral Tube size: 6.5 mm Number of attempts: 1 Airway Equipment and Method: Stylet and Video-laryngoscopy Placement Confirmation: ETT inserted through vocal cords under direct vision, positive ETCO2 and breath sounds checked- equal and bilateral Secured at: 20 cm Tube secured with: Tape Dental Injury: Teeth and Oropharynx as per pre-operative assessment

## 2023-10-18 ENCOUNTER — Encounter: Payer: Self-pay | Admitting: Neurosurgery

## 2023-10-28 NOTE — Progress Notes (Unsigned)
   REFERRING PHYSICIAN:  [name], Md [address] [facility] [address] [place],  [place] [address]  DOS: 10/17/23 successful removal, replacement, and repositioning of left L5-S1 rod  HISTORY OF PRESENT ILLNESS: Lindsay Fields is approximately 2 weeks status post above surgery. Was given oxycodone and robaxin on discharge from the hospital.   She is doing well with some soreness/tightness in her lower back. No leg pain. No numbness, tingling, or weakness.   She is taking prn robaxin and prn tylenol. Not taking oxycodone anymore.    PHYSICAL EXAMINATION:  General: Patient is well developed, well nourished, calm, collected, and in no apparent distress.   NEUROLOGICAL:  General: In no acute distress.   Awake, alert, oriented to person, place, and time.  Pupils equal round and reactive to light.  Facial tone is symmetric.     Strength:           Side Iliopsoas Quads Hamstring PF DF EHL  R 5 5 5 5 5 5   L 5 5 5 5 5 5    Incisions c/d/i   ROS (Neurologic):  Negative except as noted above  IMAGING: Nothing new to review.   ASSESSMENT/PLAN:  Lindsay Fields is doing well s/p above surgery. Treatment options reviewed with patient and following plan made:   - I have advised the patient to lift up to 10 pounds until 6 weeks after surgery (follow up with Dr. Myer Haff).  - Reviewed wound care.  - No bending, twisting, or lifting.  - Will get lumbar xrays on her way out and message her with results.  - Continue on current medications including prn robaxin and prn OTC tylenol as directed on bottle.  - Follow up as scheduled in 4 weeks and prn.   Advised to contact the office if any questions or concerns arise.  Drake Leach PA-C Department of neurosurgery

## 2023-11-01 ENCOUNTER — Ambulatory Visit
Admission: RE | Admit: 2023-11-01 | Discharge: 2023-11-01 | Disposition: A | Discharge status: Home / Self Care | Payer: Medicare HMO | Attending: Orthopedic Surgery | Admitting: Orthopedic Surgery

## 2023-11-01 ENCOUNTER — Encounter: Payer: Self-pay | Admitting: Orthopedic Surgery

## 2023-11-01 ENCOUNTER — Ambulatory Visit (INDEPENDENT_AMBULATORY_CARE_PROVIDER_SITE_OTHER): Payer: Medicare HMO | Admitting: Orthopedic Surgery

## 2023-11-01 ENCOUNTER — Ambulatory Visit
Admission: RE | Admit: 2023-11-01 | Discharge: 2023-11-01 | Disposition: A | Discharge status: Home / Self Care | Payer: Medicare HMO | Source: Ambulatory Visit | Attending: Orthopedic Surgery | Admitting: Orthopedic Surgery

## 2023-11-01 ENCOUNTER — Encounter: Payer: Self-pay | Admitting: Neurosurgery

## 2023-11-01 VITALS — BP 118/78 | Temp 97.7°F | Ht 66.0 in | Wt 140.0 lb

## 2023-11-01 DIAGNOSIS — Z09 Encounter for follow-up examination after completed treatment for conditions other than malignant neoplasm: Secondary | ICD-10-CM

## 2023-11-01 DIAGNOSIS — T84498D Other mechanical complication of other internal orthopedic devices, implants and grafts, subsequent encounter: Secondary | ICD-10-CM

## 2023-11-01 DIAGNOSIS — T84498S Other mechanical complication of other internal orthopedic devices, implants and grafts, sequela: Secondary | ICD-10-CM | POA: Diagnosis present

## 2023-11-14 ENCOUNTER — Ambulatory Visit
Payer: Medicare HMO | Attending: Student in an Organized Health Care Education/Training Program | Admitting: Student in an Organized Health Care Education/Training Program

## 2023-11-14 ENCOUNTER — Encounter: Payer: Self-pay | Admitting: Student in an Organized Health Care Education/Training Program

## 2023-11-14 DIAGNOSIS — M7138 Other bursal cyst, other site: Secondary | ICD-10-CM | POA: Diagnosis not present

## 2023-11-14 DIAGNOSIS — M5416 Radiculopathy, lumbar region: Secondary | ICD-10-CM | POA: Insufficient documentation

## 2023-11-14 DIAGNOSIS — G894 Chronic pain syndrome: Secondary | ICD-10-CM | POA: Diagnosis not present

## 2023-11-14 DIAGNOSIS — M47816 Spondylosis without myelopathy or radiculopathy, lumbar region: Secondary | ICD-10-CM | POA: Insufficient documentation

## 2023-11-14 MED ORDER — GABAPENTIN 300 MG PO CAPS: 300.0000 mg | ORAL_CAPSULE | Freq: Two times a day (BID) | ORAL | 5 refills | Status: DC

## 2023-11-14 NOTE — Progress Notes (Signed)
PROVIDER NOTE: Information contained herein reflects review and annotations entered in association with encounter. Interpretation of such information and data should be left to medically-trained personnel. Information provided to patient can be located elsewhere in the medical record under "Patient Instructions". Document created using STT-dictation technology, any transcriptional errors that may result from process are unintentional.    Patient: Lindsay Fields  Service Category: E/M  Provider: Edward Jolly, MD  DOB: [birth year]  DOS: 11/14/2023  Referring Provider: [name], MD  MRN: [id number]  Specialty: Interventional Pain Management  PCP: [name], MD  Type: Established Patient  Setting: Ambulatory outpatient    Location: Office  Delivery: Face-to-face     HPI  Lindsay Fields, a 73 y.o. year old female, is here today because of her No primary diagnosis found.. Lindsay Fields primary complain today is Back Pain and Neck Pain (Left, posterior)  Pertinent problems: Lindsay Fields has Spondylosis without myelopathy or radiculopathy, lumbar region and Chronic pain syndrome on their pertinent problem list. Pain Assessment: Severity of Chronic pain is reported as a 3 /10. Location: Back Lower/denies. Onset:  . Quality: Tightness, Sharp. Timing:  . Modifying factor(s): pt states BID gaba is helping more than QD, Robaxin BID. Vitals:  height is 5\' 6"  (1.676 m) and weight is 140 lb (63.5 kg). Her temperature is 97.2 F (36.2 C) (abnormal). Her blood pressure is 99/48 (abnormal) and her pulse is 65. Her respiration is 16 and oxygen saturation is 100%.  BMI: Estimated body mass index is 22.6 kg/m as calculated from the following:   Height as of this encounter: 5\' 6"  (1.676 m).   Weight as of this encounter: 140 lb (63.5 kg). Last encounter: 08/23/2023. Last procedure: Visit date not found.  Reason for encounter: medication management.  History of Present Illness   The patient presents for  follow-up after revision back surgery.  She underwent initial back surgery on August 10, 2023. At her six-week follow-up, x-rays revealed that screws had come loose, necessitating a revision surgery, which was performed on October 17, 2023.  Post-revision, she initially took hydrocodone for a week to a week and a half, then transitioned to Tylenol for pain management. An increased dose of gabapentin, taken twice daily, has been beneficial. She also uses Robaxin for muscle spasms and cramps.  She experiences backaches after engaging in activities such as house cleaning, which require her to rest afterward.  She mentions having additional scars on the left side of her back, which are healing well.        ROS  Constitutional: Denies any fever or chills Gastrointestinal: No reported hemesis, hematochezia, vomiting, or acute GI distress Musculoskeletal:  +LBP Neurological: No reported episodes of acute onset apraxia, aphasia, dysarthria, agnosia, amnesia, paralysis, loss of coordination, or loss of consciousness  Medication Review  Calcium Carbonate-Vitamin D, Magnesium, acetaminophen, bisoprolol, carbidopa-levodopa, denosumab, fluticasone, gabapentin, levocetirizine, methocarbamol, rosuvastatin, senna, and vitamin D3  History Review  Allergy: Lindsay Fields is allergic to abaloparatide. Drug: Lindsay Fields  reports no history of drug use. Alcohol:  reports current alcohol use of about 3.0 - 5.0 standard drinks of alcohol per week. Tobacco:  reports that she has never smoked. She has never used smokeless tobacco. Social: Lindsay Fields  reports that she has never smoked. She has never used smokeless tobacco. She reports current alcohol use of about 3.0 - 5.0 standard drinks of alcohol per week. She reports that she does not use drugs. Medical:  has a past medical  history of Allergic genetic state, Arthritis, Bladder polyps, Degenerative disc disease, lumbar, Dysrhythmia, Failed orthopedic implant, initial  encounter (HCC), Fibrocystic breast disease, Frequent UTI, H/O degenerative disc disease, Hypercholesterolemia, Hyperlipidemia, Lumbar radicular pain, Osteoarthritis of left hip, Osteoporosis, Parkinson's disease (HCC), Pre-diabetes, and Stage 3a chronic kidney disease (CKD) (HCC). Surgical: Lindsay Fields  has a past surgical history that includes Bladder fulguration; Colonoscopy (01/01/2013); Dilation and curettage of uterus (1998); Abdominal hysterectomy; Ovarian cyst removal (Left, 1985); Tonsillectomy and adenoidectomy (1978); Colonoscopy with propofol (N/A, 07/12/2018); Colonoscopy (N/A, 03/31/2022); Esophagogastroduodenoscopy (N/A, 03/31/2022); Tubal ligation; Transforaminal lumbar interbody fusion w/ mis 1 level (Left, 08/10/2023); Application of intraoperative CT scan (N/A, 08/10/2023); and [place] Vitrectomy 27 gauge with 25 gauge port (Left, 10/17/2023). Family: family history includes Breast cancer (age of onset: 62) in her maternal aunt.  Laboratory Chemistry Profile   Renal Lab Results  Component Value Date   CREATININE 0.51 08/10/2023   GFRNONAA >60 08/10/2023    Hepatic No results found for: "AST", "ALT", "ALBUMIN", "ALKPHOS", "HCVAB", "AMYLASE", "LIPASE", "AMMONIA"  Electrolytes No results found for: "NA", "K", "CL", "CALCIUM", "MG", "PHOS"  Bone No results found for: "VD25OH", "VD125OH2TOT", "[id number]", "[id number]", "25OHVITD1", "25OHVITD2", "25OHVITD3", "TESTOFREE", "TESTOSTERONE"  Inflammation (CRP: Acute Phase) (ESR: Chronic Phase) No results found for: "CRP", "ESRSEDRATE", "LATICACIDVEN"       Note: Above Lab results reviewed.  Recent Imaging Review  DG Lumbar Spine 2-3 Views CLINICAL DATA:  Status post lumbar spine fusion revision for a "loose bolt".  EXAM: LUMBAR SPINE - 2-3 VIEW  COMPARISON:  Operative images, 10/17/2023. Other prior exams most recent dated 09/22/2023.  FINDINGS: Previous posterior fusion, L5-S1. Bilateral pedicle screws appear well positioned  and well-seated. There is cement associated with the right-sided pedicle screws, within the vertebral bodies of L5 and S1. Intact interconnecting rods. Intervertebral cage well-positioned at the L5-S1 level. Persistent anterolisthesis.  No fracture or acute finding. Slight retrolisthesis of L1 and L2. No other spondylolisthesis. Mild loss of disc height at L1-L2.  Soft tissues are unremarkable.  IMPRESSION: 1. No fracture or acute finding. 2. Interconnecting rods are now well positioned on both sides following revision. Well seated and positioned pedicle screws. No evidence of hardware failure.  Electronically Signed   By: Amie Portland M.D.   On: 11/04/2023 10:53 Note: Reviewed        Physical Exam  General appearance: Well nourished, well developed, and well hydrated. In no apparent acute distress Mental status: Alert, oriented x 3 (person, place, & time)       Respiratory: No evidence of acute respiratory distress Eyes: PERLA Vitals: BP (!) 99/48   Pulse 65   Temp (!) 97.2 F (36.2 C)   Resp 16   Ht 5\' 6"  (1.676 m)   Wt 140 lb (63.5 kg)   SpO2 100%   BMI 22.60 kg/m  BMI: Estimated body mass index is 22.6 kg/m as calculated from the following:   Height as of this encounter: 5\' 6"  (1.676 m).   Weight as of this encounter: 140 lb (63.5 kg). Ideal: Ideal body weight: 59.3 kg (130 lb 11.7 oz) Adjusted ideal body weight: 61 kg (134 lb 7 oz)  Lumbar Spine Area Exam  Skin & Axial Inspection: Well healed scar from previous spine surgery detected Alignment: Symmetrical Functional ROM: Unrestricted ROM       Stability: No instability detected Muscle Tone/Strength: Functionally intact. No obvious neuro-muscular anomalies detected. Sensory (Neurological): Unimpaired Palpation: No palpable anomalies         Assessment  Diagnosis Status  1. Chronic pain syndrome   2. Lumbar radicular pain (LEFT L5/S1)   3. Lumbar facet arthropathy   4. 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 Spondylosis without myelopathy or radiculopathy, lumbar region    Controlled Controlled Controlled   Updated Problems: No problems updated.  Plan of Care   Underwent initial spinal surgery on August 10, 2023, with a revision on October 17, 2023, due to hardware malfunction at the six-week follow-up. Post-revision, reports improvement with increased gabapentin, effectively managing neuropathic pain, and controls muscle spasms with Robaxin. Experiences back pain with increased activity but is otherwise healing well.  Refill gabapentin for six months, continue Robaxin, and schedule a follow-up appointment in summer.       Lindsay Fields has a current medication list which includes the following long-term medication(s): bisoprolol, calcium carbonate-vitamin d, carbidopa-levodopa, fluticasone, levocetirizine, rosuvastatin, and gabapentin.  Pharmacotherapy (Medications Ordered): Meds ordered this encounter  Medications   gabapentin (NEURONTIN) 300 MG capsule    Sig: Take 1 capsule (300 mg total) by mouth 2 (two) times daily.    Dispense:  60 capsule    Refill:  5    Fill one day early if pharmacy is closed on scheduled refill date. May substitute for generic if available.   Orders:  No orders of the defined types were placed in this encounter.  Follow-up plan:   Return in about 6 months (around 05/13/2024) for MM, F2F.      Status post left L4-L5 Sprint peripheral nerve stimulation, left L4-L5 epidural steroid injection 08/02/2022     Recent Visits Date Type Provider Dept  08/23/23 Office Visit Edward Jolly, MD [facility]  Showing recent visits within past 90 days and meeting all other requirements Today's Visits Date Type Provider Dept  11/14/23 Office Visit Edward Jolly, MD [facility]  Showing today's visits and meeting all other requirements Future Appointments No visits were found meeting these conditions. Showing future  appointments within next 90 days and meeting all other requirements  I discussed the assessment and treatment plan with the patient. The patient was provided an opportunity to ask questions and all were answered. The patient agreed with the plan and demonstrated an understanding of the instructions.  Patient advised to call back or seek an in-person evaluation if the symptoms or condition worsens.  Duration of encounter: .  Total time on encounter, as per AMA guidelines included both the face-to-face and non-face-to-face time personally spent by the physician and/or other qualified health care professional(s) on the day of the encounter (includes time in activities that require the physician or other qualified health care professional and does not include time in activities normally performed by clinical staff). Physician's time may include the following activities when performed: Preparing to see the patient (e.g., pre-charting review of records, searching for previously ordered imaging, lab work, and nerve conduction tests) Review of prior analgesic pharmacotherapies. Reviewing PMP Interpreting ordered tests (e.g., lab work, imaging, nerve conduction tests) Performing post-procedure evaluations, including interpretation of diagnostic procedures Obtaining and/or reviewing separately obtained history Performing a medically appropriate examination and/or evaluation Counseling and educating the patient/family/caregiver Ordering medications, tests, or procedures Referring and communicating with other health care professionals (when not separately reported) Documenting clinical information in the electronic or other health record Independently interpreting results (not separately reported) and communicating results to the patient/ family/caregiver Care coordination (not separately reported)  Note by: Edward Jolly, MD Date: 11/14/2023; Time: 3:18  PM

## 2023-11-14 NOTE — Progress Notes (Signed)
 Safety precautions to be maintained throughout the outpatient stay will include: orient to surroundings, keep bed in low position, maintain call bell within reach at all times, provide assistance with transfer out of bed and ambulation.

## 2023-11-17 ENCOUNTER — Encounter: Payer: Medicare HMO | Admitting: Student in an Organized Health Care Education/Training Program

## 2023-11-29 ENCOUNTER — Ambulatory Visit
Admission: RE | Admit: 2023-11-29 | Discharge: 2023-11-29 | Disposition: A | Discharge status: Home / Self Care | Payer: Medicare HMO | Source: Ambulatory Visit | Attending: Neurosurgery | Admitting: Neurosurgery

## 2023-11-29 ENCOUNTER — Ambulatory Visit (INDEPENDENT_AMBULATORY_CARE_PROVIDER_SITE_OTHER): Payer: Medicare HMO | Admitting: Neurosurgery

## 2023-11-29 ENCOUNTER — Ambulatory Visit
Admission: RE | Admit: 2023-11-29 | Discharge: 2023-11-29 | Disposition: A | Discharge status: Home / Self Care | Payer: Medicare HMO | Attending: Neurosurgery | Admitting: Neurosurgery

## 2023-11-29 ENCOUNTER — Other Ambulatory Visit: Payer: Self-pay

## 2023-11-29 VITALS — BP 120/76 | Ht 66.0 in | Wt 140.0 lb

## 2023-11-29 DIAGNOSIS — M4316 Spondylolisthesis, lumbar region: Secondary | ICD-10-CM | POA: Diagnosis present

## 2023-11-29 DIAGNOSIS — Z09 Encounter for follow-up examination after completed treatment for conditions other than malignant neoplasm: Secondary | ICD-10-CM

## 2023-11-29 DIAGNOSIS — T84498D Other mechanical complication of other internal orthopedic devices, implants and grafts, subsequent encounter: Secondary | ICD-10-CM

## 2023-11-29 DIAGNOSIS — T84498S Other mechanical complication of other internal orthopedic devices, implants and grafts, sequela: Secondary | ICD-10-CM

## 2023-11-29 NOTE — Progress Notes (Signed)
   REFERRING PHYSICIAN:  [name], Md [address] [facility] [address] [place],  [place] [address]  DOS: 10/17/23 successful removal, replacement, and repositioning of left L5-S1 rod  HISTORY OF PRESENT ILLNESS: Lindsay Fields is status post above surgery.   She is sore but doing well.    PHYSICAL EXAMINATION:  General: Patient is well developed, well nourished, calm, collected, and in no apparent distress.   NEUROLOGICAL:  General: In no acute distress.   Awake, alert, oriented to person, place, and time.  Pupils equal round and reactive to light.  Facial tone is symmetric.     Strength:           Side Iliopsoas Quads Hamstring PF DF EHL  R 5 5 5 5 5 5   L 5 5 5 5 5 5    Incisions c/d/i   ROS (Neurologic):  Negative except as noted above  IMAGING: No complications noted  ASSESSMENT/PLAN:  Lindsay Fields is doing well s/p above surgery.   Continue slow increase in activity.  Restrictions reviewed.   Venetia Night MD Department of neurosurgery

## 2023-12-01 ENCOUNTER — Telehealth: Payer: Self-pay | Admitting: Orthopedic Surgery

## 2023-12-01 NOTE — Telephone Encounter (Signed)
 DOS: 10/17/23 successful removal, replacement, and repositioning of left L5-S1 rod   Refill for robaxin okay and sent to her pharmacy. Please let her know.

## 2023-12-23 ENCOUNTER — Other Ambulatory Visit: Payer: Self-pay | Admitting: Orthopedic Surgery

## 2023-12-23 DIAGNOSIS — T84498S Other mechanical complication of other internal orthopedic devices, implants and grafts, sequela: Secondary | ICD-10-CM

## 2023-12-23 DIAGNOSIS — Z981 Arthrodesis status: Secondary | ICD-10-CM

## 2023-12-23 NOTE — Progress Notes (Signed)
   REFERRING PHYSICIAN:  [name], Md [address] [facility] [place],  [place] [address]  DOS: 10/17/23 successful removal, replacement, and repositioning of left L5-S1 rod  DOS: 08/10/23 L5-S1 TLIF   HISTORY OF PRESENT ILLNESS:  She was doing well at her last visit with some soreness.   She is having some tightness/soreness in her lower back when standing/cooking for more than 15 minutes. No leg pain. No numbness, tingling, or weakness.   She is taking neurontin. She is taking robaxin bid. She takes prn tylenol as well.    PHYSICAL EXAMINATION:  General: Patient is well developed, well nourished, calm, collected, and in no apparent distress.   NEUROLOGICAL:  General: In no acute distress.   Awake, alert, oriented to person, place, and time.  Pupils equal round and reactive to light.  Facial tone is symmetric.     Strength:           Side Iliopsoas Quads Hamstring PF DF EHL  R 5 5 5 5 5 5   L 5 5 5 5 5 5    Incisions well healed   ROS (Neurologic):  Negative except as noted above  IMAGING: Lumbar xrays dated 01/05/24:  No complications noted.   Report for above xrays not yet available.   ASSESSMENT/PLAN:  Lindsay Fields is doing well s/p above surgery. Treatment options reviewed with patient and following plan made:   - She can slowly return to activity as tolerated. Does not do any significant heavy lifting.  - Continue on prn robaxin bid. No refills needed at this time.  - Follow up with Dr. Myer Haff in 6 months with repeat xrays.   Advised to contact the office if any questions or concerns arise.  Drake Leach PA-C Department of neurosurgery

## 2024-01-05 ENCOUNTER — Ambulatory Visit
Admission: RE | Admit: 2024-01-05 | Discharge: 2024-01-05 | Disposition: A | Discharge status: Home / Self Care | Attending: Orthopedic Surgery | Admitting: Orthopedic Surgery

## 2024-01-05 ENCOUNTER — Ambulatory Visit
Admission: RE | Admit: 2024-01-05 | Discharge: 2024-01-05 | Disposition: A | Discharge status: Home / Self Care | Source: Ambulatory Visit | Attending: Orthopedic Surgery | Admitting: Orthopedic Surgery

## 2024-01-05 ENCOUNTER — Encounter: Payer: Self-pay | Admitting: Orthopedic Surgery

## 2024-01-05 ENCOUNTER — Ambulatory Visit (INDEPENDENT_AMBULATORY_CARE_PROVIDER_SITE_OTHER): Payer: Medicare HMO | Admitting: Orthopedic Surgery

## 2024-01-05 VITALS — BP 104/62 | Temp 98.1°F | Ht 66.0 in | Wt 140.0 lb

## 2024-01-05 DIAGNOSIS — Z981 Arthrodesis status: Secondary | ICD-10-CM

## 2024-01-05 DIAGNOSIS — T84498S Other mechanical complication of other internal orthopedic devices, implants and grafts, sequela: Secondary | ICD-10-CM

## 2024-01-05 DIAGNOSIS — T84498D Other mechanical complication of other internal orthopedic devices, implants and grafts, subsequent encounter: Secondary | ICD-10-CM

## 2024-01-27 ENCOUNTER — Other Ambulatory Visit: Payer: Self-pay | Admitting: Orthopedic Surgery

## 2024-03-06 ENCOUNTER — Other Ambulatory Visit: Payer: Self-pay | Admitting: Physician Assistant

## 2024-03-09 ENCOUNTER — Other Ambulatory Visit: Payer: Self-pay | Admitting: Orthopedic Surgery

## 2024-03-09 DIAGNOSIS — Z981 Arthrodesis status: Secondary | ICD-10-CM

## 2024-03-09 NOTE — Progress Notes (Signed)
   REFERRING PHYSICIAN:  Jimmy Moulding, Md [address] [facility] [place],  [place] [address]  DOS: 10/17/23 successful removal, replacement, and repositioning of left L5-S1 rod  DOS: 08/10/23 L5-S1 TLIF   HISTORY OF PRESENT ILLNESS:  She was doing well at her last visit with some soreness. No leg pain.   She called with increased pain in her lower back and is here for follow up. She notes more constant LBP. She had episode of left lateral leg pain to her foot a few weeks ago but it has since resolved. No numbness, tingling, or weakness in her legs. Thinks pain may have started after lifting some water  in the grocery store.   She is taking robaxin  bid. She has been alternating with tylenol  and motrin.   PHYSICAL EXAMINATION:  General: Patient is well developed, well nourished, calm, collected, and in no apparent distress.   NEUROLOGICAL:  General: In no acute distress.   Awake, alert, oriented to person, place, and time.  Pupils equal round and reactive to light.  Facial tone is symmetric.     Strength:           Side Iliopsoas Quads Hamstring PF DF EHL  R 5 5 5 5 5 5   L 5 5 5 5 5 5    Incisions well healed   ROS (Neurologic):  Negative except as noted above  IMAGING: Lumbar xrays dated 03/14/24:  No complications noted.   Report for above xrays not yet available.   ASSESSMENT/PLAN:  Lindsay Fields is having some increased LBP. No leg pain.  Treatment options reviewed with patient and following plan made:   - Medrol dose pack for symptom relief. Reviewed dosing and side effects.  - Stop motrin and tylenol  when taking dose pack.  - Continue on prn robaxin  bid. No refills needed at this time.  - She will let me know if no improvement and they would consider formal PT.  - Keep follow up with Dr. Mont Antis in 6 months with repeat xrays.   Advised to contact the office if any questions or concerns arise.  I spent a total of 15 minutes in  face-to-face and non-face-to-face activities related to this patient's care today including review of outside records, review of imaging, review of symptoms, physical exam, discussion of differential diagnosis, discussion of treatment options, and documentation.   Lucetta Russel PA-C Department of neurosurgery

## 2024-03-14 ENCOUNTER — Encounter: Payer: Self-pay | Admitting: Orthopedic Surgery

## 2024-03-14 ENCOUNTER — Ambulatory Visit: Admitting: Orthopedic Surgery

## 2024-03-14 ENCOUNTER — Ambulatory Visit
Admission: RE | Admit: 2024-03-14 | Discharge: 2024-03-14 | Disposition: A | Discharge status: Home / Self Care | Attending: Orthopedic Surgery | Admitting: Orthopedic Surgery

## 2024-03-14 ENCOUNTER — Ambulatory Visit
Admission: RE | Admit: 2024-03-14 | Discharge: 2024-03-14 | Disposition: A | Discharge status: Home / Self Care | Source: Ambulatory Visit | Attending: Orthopedic Surgery | Admitting: Orthopedic Surgery

## 2024-03-14 VITALS — BP 118/72 | Ht 66.0 in | Wt 140.0 lb

## 2024-03-14 DIAGNOSIS — Z981 Arthrodesis status: Secondary | ICD-10-CM | POA: Insufficient documentation

## 2024-03-14 DIAGNOSIS — M47816 Spondylosis without myelopathy or radiculopathy, lumbar region: Secondary | ICD-10-CM

## 2024-03-14 DIAGNOSIS — M545 Low back pain, unspecified: Secondary | ICD-10-CM | POA: Diagnosis not present

## 2024-03-14 MED ORDER — METHYLPREDNISOLONE 4 MG PO TBPK: ORAL_TABLET | ORAL | 0 refills | Status: DC

## 2024-04-24 ENCOUNTER — Telehealth: Payer: Self-pay | Admitting: Neurosurgery

## 2024-04-24 NOTE — Telephone Encounter (Signed)
 Patient notified

## 2024-04-24 NOTE — Telephone Encounter (Signed)
 DOS: 10/17/23 successful removal, replacement, and repositioning of left L5-S1 rod   DOS: 08/10/23 L5-S1 TLIF   Has f/u with Cl[facility] on 07/10/24.   Refill of robaxin  okay. Sent to pharmacy. Please let her know.

## 2024-05-10 ENCOUNTER — Encounter: Payer: Medicare HMO | Admitting: Student in an Organized Health Care Education/Training Program

## 2024-05-15 ENCOUNTER — Encounter: Payer: Self-pay | Admitting: Nurse Practitioner

## 2024-05-15 ENCOUNTER — Ambulatory Visit: Attending: Nurse Practitioner | Admitting: Nurse Practitioner

## 2024-05-15 DIAGNOSIS — G894 Chronic pain syndrome: Secondary | ICD-10-CM | POA: Insufficient documentation

## 2024-05-15 DIAGNOSIS — M47816 Spondylosis without myelopathy or radiculopathy, lumbar region: Secondary | ICD-10-CM | POA: Insufficient documentation

## 2024-05-15 DIAGNOSIS — M7138 Other bursal cyst, other site: Secondary | ICD-10-CM | POA: Diagnosis not present

## 2024-05-15 DIAGNOSIS — M5416 Radiculopathy, lumbar region: Secondary | ICD-10-CM | POA: Diagnosis not present

## 2024-05-15 MED ORDER — GABAPENTIN 300 MG PO CAPS: 300.0000 mg | ORAL_CAPSULE | Freq: Two times a day (BID) | ORAL | 5 refills | Status: DC

## 2024-05-15 NOTE — Progress Notes (Signed)
 PROVIDER NOTE: Interpretation of information contained herein should be left to medically-trained personnel. Specific patient instructions are provided elsewhere under Patient Instructions section of medical record. This document was created in part using AI and STT-dictation technology, any transcriptional errors that may result from this process are unintentional.  Patient: Lindsay Fields  Service: E/M   PCP: Lenon Layman ORN, MD  DOB: May 23, 1951  DOS: 05/15/2024  Provider: Emmy MARLA Blanch, NP  MRN: [id number]  Delivery: Face-to-face  Specialty: Interventional Pain Management  Type: Established Patient  Setting: Ambulatory outpatient facility  Specialty designation: 09  Referring Prov.: Lenon Layman ORN, MD  Location: Outpatient office facility       History of present illness (HPI) Lindsay Fields, a 73 y.o. year old female, is here today because of her No primary diagnosis found.. Lindsay Fields primary complain today is Back Pain (Lower back)  Pertinent problems: Lindsay Fields has spondylosis without myelopathy or radiculopathy, lumbar region and chronic pain syndrome on their pertinent problem list.  Pain Assessment: Severity of Chronic pain is reported as a 3 /10. Location: Back Lower/radiates to both hips. Onset: More than a month ago. Quality: Aching. Timing: Constant. Modifying factor(s): tylenol ; ibuprogen. Vitals:  height is 5' 6 (1.676 m) and weight is 145 lb (65.8 kg). Her temperature is 97.9 F (36.6 C). Her blood pressure is 120/69 and her pulse is 59 (abnormal). Her oxygen saturation is 98%.  BMI: Estimated body mass index is 23.4 kg/m as calculated from the following:   Height as of this encounter: 5' 6 (1.676 m).   Weight as of this encounter: 145 lb (65.8 kg).  Last encounter: 11/14/2023 Last procedure: Visit date not found.  Reason for encounter: medication management. No change in medical history since last visit.  Patient's pain is at baseline.  Patient continues multimodal  pain regimen as prescribed.  States that it provides pain relief and improvement in functional status.   Pharmacotherapy Assessment   Gabapentin  (Neurontin ) 300 mg capsule 2 times daily as needed for neuropathic pain. Monitoring: [place] PMP: PDMP reviewed during this encounter.       Pharmacotherapy: No side-effects or adverse reactions reported. Compliance: No problems identified. Effectiveness: Clinically acceptable.  Margrette Nathanel PARAS, RN  71/02/2024 10:51 AM  Sign when Signing Visit Safety precautions to be maintained throughout the outpatient stay will include: orient to surroundings, keep bed in low position, maintain call bell within reach at all times, provide assistance with transfer out of bed and ambulation.     UDS:  No results found for: SUMMARY  No results found for: CBDTHCR No results found for: D8THCCBX No results found for: D9THCCBX  ROS  Constitutional: Denies any fever or chills Gastrointestinal: No reported hemesis, hematochezia, vomiting, or acute GI distress Musculoskeletal: Lower back pain Neurological: No reported episodes of acute onset apraxia, aphasia, dysarthria, agnosia, amnesia, paralysis, loss of coordination, or loss of consciousness  Medication Review  Calcium  Carb-Cholecalciferol , Magnesium , acetaminophen , bisoprolol , carbidopa -levodopa , denosumab, fluticasone , gabapentin , ibuprofen, levocetirizine, melatonin, methocarbamol , methylPREDNISolone , rosuvastatin , senna, and vitamin D3  History Review  Allergy: Lindsay Fields is allergic to abaloparatide. Drug: Lindsay Fields  reports no history of drug use. Alcohol:  reports current alcohol use of about 3.0 - 5.0 standard drinks of alcohol per week. Tobacco:  reports that she has never smoked. She has never used smokeless tobacco. Social: Lindsay Fields  reports that she has never smoked. She has never used smokeless tobacco. She reports current alcohol use of about 3.0 - 5.0  standard drinks of alcohol per week. She  reports that she does not use drugs. Medical:  has a past medical history of Allergic genetic state, Arthritis, Bladder polyps, Degenerative disc disease, lumbar, Dysrhythmia, Failed orthopedic implant, initial encounter (HCC), Fibrocystic breast disease, Frequent UTI, H/O degenerative disc disease, Hypercholesterolemia, Hyperlipidemia, Lumbar radicular pain, Osteoarthritis of left hip, Osteoporosis, Parkinson's disease (HCC), Pre-diabetes, and Stage 3a chronic kidney disease (CKD) (HCC). Surgical: Lindsay Fields  has a past surgical history that includes Bladder fulguration; Colonoscopy (01/01/2013); Dilation and curettage of uterus (1998); Abdominal hysterectomy; Ovarian cyst removal (Left, 1985); Tonsillectomy and adenoidectomy (1978); Colonoscopy with propofol  (N/A, 07/12/2018); Colonoscopy (N/A, 03/31/2022); Esophagogastroduodenoscopy (N/A, 03/31/2022); Tubal ligation; Transforaminal lumbar interbody fusion w/ mis 1 level (Left, 08/10/2023); Application of intraoperative CT scan (N/A, 08/10/2023); and [place] Vitrectomy 27 gauge with 25 gauge port (Left, 10/17/2023). Family: family history includes Breast cancer (age of onset: 73) in her maternal aunt.  Laboratory Chemistry Profile   Renal Lab Results  Component Value Date   CREATININE 0.51 08/10/2023   GFRNONAA >60 08/10/2023    Hepatic No results found for: AST, ALT, ALBUMIN, ALKPHOS, HCVAB, AMYLASE, LIPASE, AMMONIA  Electrolytes No results found for: NA, K, CL, CALCIUM , MG, PHOS  Bone No results found for: VD25OH, [id number], CI6874NY7, CI7874NY7, 25OHVITD1, 25OHVITD2, 25OHVITD3, TESTOFREE, TESTOSTERONE  Inflammation (CRP: Acute Phase) (ESR: Chronic Phase) No results found for: CRP, ESRSEDRATE, LATICACIDVEN       Note: Above Lab results reviewed.  Recent Imaging Review  DG Lumbar Spine 2-3 Views CLINICAL DATA:  Low back pain status post lumbar fusion October 12, 2023. Pain across lower  back and into both hips  EXAM: LUMBAR SPINE - 2-3 VIEW  COMPARISON:  Radiographs 01/05/2024  FINDINGS: Posterior fusion L5-S1 with interbody spacer. No evidence of loosening. No significant change from 01/05/2024. Unchanged slight retrolisthesis of L1, L2, L3. No evidence of acute fracture or traumatic listhesis.  IMPRESSION: No acute abnormality.  Posterior fusion of L5-S1.  Electronically Signed   By: Norman Gatlin M.D.   On: 03/20/2024 22:29 Note: Reviewed        Physical Exam  Vitals: BP 120/69   Pulse (!) 59   Temp 97.9 F (36.6 C)   Ht 5' 6 (1.676 m)   Wt 145 lb (65.8 kg)   SpO2 98%   BMI 23.40 kg/m  BMI: Estimated body mass index is 23.4 kg/m as calculated from the following:   Height as of this encounter: 5' 6 (1.676 m).   Weight as of this encounter: 145 lb (65.8 kg). Ideal: Ideal body weight: 59.3 kg (130 lb 11.7 oz) Adjusted ideal body weight: 61.9 kg (136 lb 7 oz) General appearance: Well nourished, well developed, and well hydrated. In no apparent acute distress Mental status: Alert, oriented x 3 (person, place, & time)       Respiratory: No evidence of acute respiratory distress Eyes: PERLA   Assessment   Diagnosis Status  1. Chronic pain syndrome   2. Lumbar radicular pain (LEFT L5/S1)   3. Lumbar facet arthropathy   4. 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 Spondylosis without myelopathy or radiculopathy, lumbar region    Controlled Controlled Controlled   Updated Problems: No problems updated.  Plan of Care  Problem-specific:  Assessment and Plan Will continue on current medication regimen.  Prescribing drug monitoring (PDMP) reviewed; findings consistent with the use of prescribed medication and no evidence of other narcotics or medication misuse or abuse.  Schedule  follow-up in 66-month for medication management with Desean Heemstra, NP.  No other new issues or concerns reported to this visit.   Ms. PEARLIE NIES has a  current medication list which includes the following long-term medication(s): bisoprolol , calcium  carb-cholecalciferol , carbidopa -levodopa , fluticasone , gabapentin , levocetirizine, and rosuvastatin .  Pharmacotherapy (Medications Ordered): Meds ordered this encounter  Medications   gabapentin  (NEURONTIN ) 300 MG capsule    Sig: Take 1 capsule (300 mg total) by mouth 2 (two) times daily.    Dispense:  60 capsule    Refill:  5    Fill one day early if pharmacy is closed on scheduled refill date. May substitute for generic if available.   Orders:  No orders of the defined types were placed in this encounter.     Return in about 6 months (around 11/15/2024) for (F2F), (MM), Emmy Blanch NP.    Recent Visits No visits were found meeting these conditions. Showing recent visits within past 90 days and meeting all other requirements Today's Visits Date Type Provider Dept  05/15/24 Office Visit Jaylise Peek K, NP [facility]  Showing today's visits and meeting all other requirements Future Appointments No visits were found meeting these conditions. Showing future appointments within next 90 days and meeting all other requirements  I discussed the assessment and treatment plan with the patient. The patient was provided an opportunity to ask questions and all were answered. The patient agreed with the plan and demonstrated an understanding of the instructions.  Patient advised to call back or seek an in-person evaluation if the symptoms or condition worsens.  Duration of encounter: 30 minutes.  Total time on encounter, as per AMA guidelines included both the face-to-face and non-face-to-face time personally spent by the physician and/or other qualified health care professional(s) on the day of the encounter (includes time in activities that require the physician or other qualified health care professional and does not include time in activities normally performed by clinical staff).  Physician's time may include the following activities when performed: Preparing to see the patient (e.g., pre-charting review of records, searching for previously ordered imaging, lab work, and nerve conduction tests) Review of prior analgesic pharmacotherapies. Reviewing PMP Interpreting ordered tests (e.g., lab work, imaging, nerve conduction tests) Performing post-procedure evaluations, including interpretation of diagnostic procedures Obtaining and/or reviewing separately obtained history Performing a medically appropriate examination and/or evaluation Counseling and educating the patient/family/caregiver Ordering medications, tests, or procedures Referring and communicating with other health care professionals (when not separately reported) Documenting clinical information in the electronic or other health record Independently interpreting results (not separately reported) and communicating results to the patient/ family/caregiver Care coordination (not separately reported)  Note by: Margaree Sandhu K Izaha Shughart, NP (TTS and AI technology used. I apologize for any typographical errors that were not detected and corrected.) Date: 05/15/2024; Time: 12:08 PM

## 2024-05-15 NOTE — Progress Notes (Signed)
 Safety precautions to be maintained throughout the outpatient stay will include: orient to surroundings, keep bed in low position, maintain call bell within reach at all times, provide assistance with transfer out of bed and ambulation.

## 2024-06-27 ENCOUNTER — Other Ambulatory Visit: Payer: Self-pay | Admitting: Internal Medicine

## 2024-06-27 DIAGNOSIS — Z1231 Encounter for screening mammogram for malignant neoplasm of breast: Secondary | ICD-10-CM

## 2024-07-05 ENCOUNTER — Other Ambulatory Visit: Payer: Self-pay

## 2024-07-05 DIAGNOSIS — M47816 Spondylosis without myelopathy or radiculopathy, lumbar region: Secondary | ICD-10-CM

## 2024-07-05 DIAGNOSIS — M5416 Radiculopathy, lumbar region: Secondary | ICD-10-CM

## 2024-07-10 ENCOUNTER — Ambulatory Visit
Admission: RE | Admit: 2024-07-10 | Discharge: 2024-07-10 | Disposition: A | Discharge status: Home / Self Care | Attending: Neurosurgery | Admitting: Neurosurgery

## 2024-07-10 ENCOUNTER — Ambulatory Visit: Admitting: Neurosurgery

## 2024-07-10 ENCOUNTER — Encounter: Payer: Self-pay | Admitting: Neurosurgery

## 2024-07-10 ENCOUNTER — Ambulatory Visit
Admission: RE | Admit: 2024-07-10 | Discharge: 2024-07-10 | Disposition: A | Source: Ambulatory Visit | Attending: Neurosurgery | Admitting: Neurosurgery

## 2024-07-10 VITALS — BP 120/78 | Ht 66.0 in | Wt 145.0 lb

## 2024-07-10 DIAGNOSIS — M5416 Radiculopathy, lumbar region: Secondary | ICD-10-CM

## 2024-07-10 DIAGNOSIS — Z981 Arthrodesis status: Secondary | ICD-10-CM | POA: Diagnosis not present

## 2024-07-10 DIAGNOSIS — Z09 Encounter for follow-up examination after completed treatment for conditions other than malignant neoplasm: Secondary | ICD-10-CM

## 2024-07-10 DIAGNOSIS — M47816 Spondylosis without myelopathy or radiculopathy, lumbar region: Secondary | ICD-10-CM | POA: Diagnosis present

## 2024-07-10 NOTE — Progress Notes (Signed)
   REFERRING PHYSICIAN:  Lenon Layman ORN, Md [address] [facility] [place],  [place] [address]  DOS: 10/17/23 successful removal, replacement, and repositioning of left L5-S1 rod  DOS: 08/10/23 L5-S1 TLIF   HISTORY OF PRESENT ILLNESS:  07/10/2024 She is doing better than at her last visit, but still having some low back pain.  She is hoping to be able to stand for longer.  She is still doing better than she did prior to her surgery.   03/14/2024 Lindsay Fields) She was doing well at her last visit with some soreness. No leg pain.   She called with increased pain in her lower back and is here for follow up. She notes more constant LBP. She had episode of left lateral leg pain to her foot a few weeks ago but it has since resolved. No numbness, tingling, or weakness in her legs. Thinks pain may have started after lifting some water  in the grocery store.   She is taking robaxin  bid. She has been alternating with tylenol  and motrin.   PHYSICAL EXAMINATION:  General: Patient is well developed, well nourished, calm, collected, and in no apparent distress.   NEUROLOGICAL:  General: In no acute distress.   Awake, alert, oriented to person, place, and time.  Pupils equal round and reactive to light.  Facial tone is symmetric.     Strength:           Side Iliopsoas Quads Hamstring PF DF EHL  R 5 5 5 5 5 5   L 5 5 5 5 5 5    Incisions well healed   ROS (Neurologic):  Negative except as noted above  IMAGING: Lumbar xrays dated 03/14/24:  No complications noted.   X-rays today show no changes  ASSESSMENT/PLAN:  Lindsay Fields is doing pretty well after her surgery and revision.  I have given her some exercises to work on her back strength.  We will see her back on an as-needed basis.  If her pain worsens, I will be happy to see her back.     I spent a total of 10 minutes in face-to-face and non-face-to-face activities related to this patient's care today including  review of outside records, review of imaging, review of symptoms, physical exam, discussion of differential diagnosis, discussion of treatment options, and documentation.   Reeves Daisy MD Department of neurosurgery

## 2024-07-17 ENCOUNTER — Other Ambulatory Visit: Payer: Self-pay | Admitting: Medical Genetics

## 2024-07-24 ENCOUNTER — Ambulatory Visit
Admission: RE | Admit: 2024-07-24 | Discharge: 2024-07-24 | Disposition: A | Discharge status: Home / Self Care | Source: Ambulatory Visit | Attending: Internal Medicine | Admitting: Internal Medicine

## 2024-07-24 DIAGNOSIS — Z1231 Encounter for screening mammogram for malignant neoplasm of breast: Secondary | ICD-10-CM | POA: Diagnosis present

## 2024-08-23 ENCOUNTER — Other Ambulatory Visit

## 2024-08-23 DIAGNOSIS — Z006 Encounter for examination for normal comparison and control in clinical research program: Secondary | ICD-10-CM

## 2024-09-10 LAB — GENECONNECT MOLECULAR SCREEN: Genetic Analysis Overall Interpretation: NEGATIVE

## 2024-11-12 DIAGNOSIS — J309 Allergic rhinitis, unspecified: Secondary | ICD-10-CM | POA: Insufficient documentation

## 2024-11-12 DIAGNOSIS — J3081 Allergic rhinitis due to animal (cat) (dog) hair and dander: Secondary | ICD-10-CM | POA: Insufficient documentation

## 2024-11-12 DIAGNOSIS — J3089 Other allergic rhinitis: Secondary | ICD-10-CM | POA: Insufficient documentation

## 2024-11-12 DIAGNOSIS — J301 Allergic rhinitis due to pollen: Secondary | ICD-10-CM | POA: Insufficient documentation

## 2024-11-14 ENCOUNTER — Encounter: Payer: Self-pay | Admitting: Nurse Practitioner

## 2024-11-14 ENCOUNTER — Ambulatory Visit: Admitting: Nurse Practitioner

## 2024-11-14 VITALS — BP 111/59 | HR 64 | Temp 96.4°F | Ht 66.0 in | Wt 149.0 lb

## 2024-11-14 DIAGNOSIS — M5416 Radiculopathy, lumbar region: Secondary | ICD-10-CM

## 2024-11-14 DIAGNOSIS — M542 Cervicalgia: Secondary | ICD-10-CM

## 2024-11-14 DIAGNOSIS — M7138 Other bursal cyst, other site: Secondary | ICD-10-CM

## 2024-11-14 DIAGNOSIS — M47816 Spondylosis without myelopathy or radiculopathy, lumbar region: Secondary | ICD-10-CM

## 2024-11-14 DIAGNOSIS — Z79899 Other long term (current) drug therapy: Secondary | ICD-10-CM | POA: Diagnosis not present

## 2024-11-14 DIAGNOSIS — G8929 Other chronic pain: Secondary | ICD-10-CM | POA: Insufficient documentation

## 2024-11-14 DIAGNOSIS — G894 Chronic pain syndrome: Secondary | ICD-10-CM

## 2024-11-14 MED ORDER — GABAPENTIN 300 MG PO CAPS: 300.0000 mg | ORAL_CAPSULE | Freq: Two times a day (BID) | ORAL | 5 refills | Status: AC

## 2024-11-14 NOTE — Patient Instructions (Signed)
 " ______________________________________________________________________    Opioid Pain Medication Update  To: All patients taking opioid pain medications. (I.e.: hydrocodone , hydromorphone, oxycodone , oxymorphone, morphine , codeine, methadone, tapentadol, tramadol, buprenorphine, fentanyl , etc.)  Re: Updated review of side effects and adverse reactions of opioid analgesics, as well as new information about long term effects of this class of medications.  Direct risks of long-term opioid therapy are not limited to opioid addiction and overdose. Potential medical risks include serious fractures, breathing problems during sleep, hyperalgesia, immunosuppression, chronic constipation, bowel obstruction, myocardial infarction, and tooth decay secondary to xerostomia.  Unpredictable adverse effects that can occur even if you take your medication correctly: Cognitive impairment, respiratory depression, and death. Most people think that if they take their medication correctly, and as instructed, that they will be safe. Nothing could be farther from the truth. In reality, a significant amount of recorded deaths associated with the use of opioids has occurred in individuals that had taken the medication for a long time, and were taking their medication correctly. The following are examples of how this can happen: Patient taking his/her medication for a long time, as instructed, without any side effects, is given a certain antibiotic or another unrelated medication, which in turn triggers a Drug-to-drug interaction leading to disorientation, cognitive impairment, impaired reflexes, respiratory depression or an untoward event leading to serious bodily harm or injury, including death.  Patient taking his/her medication for a long time, as instructed, without any side effects, develops an acute impairment of liver and/or kidney function. This will lead to a rapid inability of the body to breakdown and eliminate  their pain medication, which will result in effects similar to an overdose, but with the same medicine and dose that they had always taken. This again may lead to disorientation, cognitive impairment, impaired reflexes, respiratory depression or an untoward event leading to serious bodily harm or injury, including death.  A similar problem will occur with patients as they grow older and their liver and kidney function begins to decrease as part of the aging process.  Background information: Historically, the original case for using long-term opioid therapy to treat chronic noncancer pain was based on safety assumptions that subsequent experience has called into question. In 1996, the American Pain Society and the American Academy of Pain Medicine issued a consensus statement supporting long-term opioid therapy. This statement acknowledged the dangers of opioid prescribing but concluded that the risk for addiction was low; respiratory depression induced by opioids was short-lived, occurred mainly in opioid-naive patients, and was antagonized by pain; tolerance was not a common problem; and efforts to control diversion should not constrain opioid prescribing. This has now proven to be wrong. Experience regarding the risks for opioid addiction, misuse, and overdose in community practice has failed to support these assumptions.  According to the Centers for Disease Control and Prevention, fatal overdoses involving opioid analgesics have increased sharply over the past decade. Currently, more than 96,700 people die from drug overdoses every year. Opioids are a factor in 7 out of every 10 overdose deaths. Deaths from drug overdose have surpassed motor vehicle accidents as the leading cause of death for individuals between the ages of 27 and 8.  Clinical data suggest that neuroendocrine dysfunction may be very common in both men and women, potentially causing hypogonadism, erectile dysfunction, infertility,  decreased libido, osteoporosis, and depression. Recent studies linked higher opioid dose to increased opioid-related mortality. Controlled observational studies reported that long-term opioid therapy may be associated with increased risk for cardiovascular events.  Subsequent meta-analysis concluded that the safety of long-term opioid therapy in elderly patients has not been proven.   Side Effects and adverse reactions: Common side effects: Drowsiness (sedation). Dizziness. Nausea and vomiting. Constipation. Physical dependence -- Dependence often manifests with withdrawal symptoms when opioids are discontinued or decreased. Tolerance -- As you take repeated doses of opioids, you require increased medication to experience the same effect of pain relief. Respiratory depression -- This can occur in healthy people, especially with higher doses. However, people with COPD, asthma or other lung conditions may be even more susceptible to fatal respiratory impairment.  Uncommon side effects: An increased sensitivity to feeling pain and extreme response to pain (hyperalgesia). Chronic use of opioids can lead to this. Delayed gastric emptying (the process by which the contents of your stomach are moved into your small intestine). Muscle rigidity. Immune system and hormonal dysfunction. Quick, involuntary muscle jerks (myoclonus). Arrhythmia. Itchy skin (pruritus). Dry mouth (xerostomia).  Long-term side effects: Chronic constipation. Sleep-disordered breathing (SDB). Increased risk of bone fractures. Hypothalamic-pituitary-adrenal dysregulation. Increased risk of overdose.  RISKS: Respiratory depression and death: Opioids increase the risk of respiratory depression and death.  Drug-to-drug interactions: Opioids are relatively contraindicated in combination with benzodiazepines, sleep inducers, and other central nervous system depressants. Other classes of medications (i.e.: certain antibiotics  and even over-the-counter medications) may also trigger or induce respiratory depression in some patients.  Medical conditions: Patients with pre-existing respiratory problems are at higher risk of respiratory failure and/or depression when in combination with opioid analgesics. Opioids are relatively contraindicated in some medical conditions such as central sleep apnea.   Fractures and Falls:  Opioids increase the risk and incidence of falls. This is of particular importance in elderly patients.  Endocrine System:  Long-term administration is associated with endocrine abnormalities (endocrinopathies). (Also known as Opioid-induced Endocrinopathy) Influences on both the hypothalamic-pituitary-adrenal axis?and the hypothalamic-pituitary-gonadal axis have been demonstrated with consequent hypogonadism and adrenal insufficiency in both sexes. Hypogonadism and decreased levels of dehydroepiandrosterone sulfate have been reported in men and women. Endocrine effects include: Amenorrhoea in women (abnormal absence of menstruation) Reduced libido in both sexes Decreased sexual function Erectile dysfunction in men Hypogonadisms (decreased testicular function with shrinkage of testicles) Infertility Depression and fatigue Loss of muscle mass Anxiety Depression Immune suppression Hyperalgesia Weight gain Anemia Osteoporosis Patients (particularly women of childbearing age) should avoid opioids. There is insufficient evidence to recommend routine monitoring of asymptomatic patients taking opioids in the long-term for hormonal deficiencies.  Immune System: Human studies have demonstrated that opioids have an immunomodulating effect. These effects are mediated via opioid receptors both on immune effector cells and in the central nervous system. Opioids have been demonstrated to have adverse effects on antimicrobial response and anti-tumour surveillance. Buprenorphine has been demonstrated to have  no impact on immune function.  Opioid Induced Hyperalgesia: Human studies have demonstrated that prolonged use of opioids can lead to a state of abnormal pain sensitivity, sometimes called opioid induced hyperalgesia (OIH). Opioid induced hyperalgesia is not usually seen in the absence of tolerance to opioid analgesia. Clinically, hyperalgesia may be diagnosed if the patient on long-term opioid therapy presents with increased pain. This might be qualitatively and anatomically distinct from pain related to disease progression or to breakthrough pain resulting from development of opioid tolerance. Pain associated with hyperalgesia tends to be more diffuse than the pre-existing pain and less defined in quality. Management of opioid induced hyperalgesia requires opioid dose reduction.  Cancer: Chronic opioid therapy has been associated with an increased  risk of cancer among noncancer patients with chronic pain. This association was more evident in chronic strong opioid users. Chronic opioid consumption causes significant pathological changes in the small intestine and colon. Epidemiological studies have found that there is a link between opium  dependence and initiation of gastrointestinal cancers. Cancer is the second leading cause of death after cardiovascular disease. Chronic use of opioids can cause multiple conditions such as GERD, immunosuppression and renal damage as well as carcinogenic effects, which are associated with the incidence of cancers.   Mortality: Long-term opioid use has been associated with increased mortality among patients with chronic non-cancer pain (CNCP).  Prescription of long-acting opioids for chronic noncancer pain was associated with a significantly increased risk of all-cause mortality, including deaths from causes other than overdose.  Reference: Von Korff M, Kolodny A, Deyo RA, Chou R. Long-term opioid therapy reconsidered. Ann Intern Med. 2011 Sep 6;155(5):325-8. doi:  10.7326/[id number]. PMID: 78106373; PMCID: EFR6719914. Kit JINNY Laurence CINDERELLA Pearley JINNY, Hayward RA, Dunn KM, Jordan KP. Risk of adverse events in patients prescribed long-term opioids: A cohort study in the [place] Clinical Practice Research [employer]. Eur J Pain. 2019 May;23(5):908-922. doi: 10.1002/ejp.1357. Epub 2019 Jan 31. PMID: 69379883. Colameco S, Coren JS, Ciervo CA. Continuous opioid treatment for chronic noncancer pain: a time for moderation in prescribing. Postgrad Med. 2009 Jul;121(4):61-6. doi: 10.3810/pgm.2009.07.2032. PMID: 80358728. Gigi JONELLE Shlomo MILUS Levern IVER Conny RN, [place], Blazina I, Lonell DASEN, Bougatsos C, Deyo RA. The effectiveness and risks of long-term opioid therapy for chronic pain: a systematic review for a [employer] of [employer] Pathways to [employer]. Ann Intern Med. 2015 Feb 17;162(4):276-86. doi: 10.7326/M14-2559. PMID: 74418742. Rory CHRISTELLA Laurence [facility], [place] DM. NCHS Data Brief No. 22. [place]: Centers for Disease Control and Prevention; 2009. Sep, Increase in Fatal Poisonings Involving Opioid Analgesics in the [place] , 1999-2006. Song IA, Choi HR, Oh TK. Long-term opioid use and mortality in patients with chronic non-cancer pain: Ten-year follow-up study in [place] from 2010 through 2019. EClinicalMedicine. 2022 Jul 18;51:101558. doi: 10.1016/j.eclinm.2022.898441. PMID: 64124182; PMCID: EFR0695089. Huser, W., Schubert, T., Vogelmann, T. et al. All-cause mortality in patients with long-term opioid therapy compared with non-opioid analgesics for chronic non-cancer pain: a database study. BMC Med 18, 162 (2020). http://lester.info/ Rashidian H, Zendehdel K, Kamangar F, Malekzadeh R, Haghdoost AA. An Ecological Study of the Association between Opiate Use and Incidence of Cancers. Addict Health. 2016 Fall;8(4):252-260. PMID: 71180443; PMCID: EFR4445194.  Our Goal: Our goal is to control your pain with means other  than the use of opioid pain medications.  Our Recommendation: Talk to your physician about coming off of these medications. We can assist you with the tapering down and stopping these medicines. Based on the new information, even if you cannot completely stop the medication, a decrease in the dose may be associated with a lesser risk. Ask for other means of controlling the pain. Decrease or eliminate those factors that significantly contribute to your pain such as smoking, obesity, and a diet heavily tilted towards inflammatory nutrients.  Last Updated: 04/18/2023   ______________________________________________________________________       ______________________________________________________________________    Medication Rules  Purpose: To inform patients, and their family members, of our medication rules and regulations.  Applies to: All patients receiving prescriptions from our practice (written or electronic).  Pharmacy of record: This is the pharmacy where your electronic prescriptions will be sent. Make sure we have the correct one.  Electronic prescriptions: In compliance with the [place]  Strengthen Opioid Misuse  Prevention (STOP) Act of 2017 (Session Law 2017-74/H243), effective October 11, 2018, all controlled substances must be electronically prescribed. Written prescriptions, faxing, or calling prescriptions to a pharmacy will no longer be done.  Prescription refills: These will be provided only during in-person appointments. No medications will be renewed without a face-to-face evaluation with your provider. Applies to all prescriptions.  NOTE: The following applies primarily to controlled substances (Opioid* Pain Medications).   Type of encounter (visit): For patients receiving controlled substances, face-to-face visits are required. (Not an option and not up to the patient.)  Patient's Responsibilities: Pain Pills: Bring all pain pills to every appointment  (except for procedure appointments). Pill counts are required.  Pill Bottles: Bring pills in original pharmacy bottle. Bring bottle, even if empty. Always bring the bottle of the most recent fill.  Medication refills: You are responsible for knowing and keeping track of what medications you are taking and when is it that you will need a refill. The day before your appointment: write a list of all prescriptions that need to be refilled. The day of the appointment: give the list to the admitting nurse. Prescriptions will be written only during appointments. No prescriptions will be written on procedure days. If you forget a medication: it will not be Called in, Faxed, or electronically sent. You will need to get another appointment to get these prescribed. No early refills. Do not call asking to have your prescription filled early. Partial  or short prescriptions: Occasionally your pharmacy may not have enough pills to fill your prescription.  NEVER ACCEPT a partial fill or a prescription that is short of the total amount of pills that you were prescribed.  With controlled substances the law allows 72 hours for the pharmacy to complete the prescription.  If the prescription is not completed within 72 hours, the pharmacist will require a new prescription to be written. This means that you will be short on your medicine and we WILL NOT send another prescription to complete your original prescription.  Instead, request the pharmacy to send a carrier to a nearby branch to get enough medication to provide you with your full prescription. Prescription Accuracy: You are responsible for carefully inspecting your prescriptions before leaving our office. Have the discharge nurse carefully go over each prescription with you, before taking them home. Make sure that your name is accurately spelled, that your address is correct. Check the name and dose of your medication to make sure it is accurate. Check the number  of pills, and the written instructions to make sure they are clear and accurate. Make sure that you are given enough medication to last until your next medication refill appointment. Taking Medication: Take medication as prescribed. When it comes to controlled substances, taking less pills or less frequently than prescribed is permitted and encouraged. Never take more pills than instructed. Never take the medication more frequently than prescribed.  Inform other Doctors: Always inform, all of your healthcare providers, of all the medications you take. Pain Medication from other Providers: You are not allowed to accept any additional pain medication from any other Doctor or Healthcare provider. There are two exceptions to this rule. (see below) In the event that you require additional pain medication, you are responsible for notifying us , as stated below. Cough Medicine: Often these contain an opioid, such as codeine or hydrocodone . Never accept or take cough medicine containing these opioids if you are already taking an opioid* medication. The combination may cause respiratory failure and  death. Medication Agreement: You are responsible for carefully reading and following our Medication Agreement. This must be signed before receiving any prescriptions from our practice. Safely store a copy of your signed Agreement. Violations to the Agreement will result in no further prescriptions. (Additional copies of our Medication Agreement are available upon request.) Laws, Rules, & Regulations: All patients are expected to follow all [address] and [employer], Rules, [place] [employer]. Ignorance of the Laws does not constitute a valid excuse.  Illegal drugs and Controlled Substances: The use of illegal substances (including, but not limited to marijuana and its derivatives) and/or the illegal use of any controlled substances is strictly prohibited. Violation of this rule may result in the immediate and permanent  discontinuation of any and all prescriptions being written by our practice. The use of any illegal substances is prohibited. Adopted CDC guidelines & recommendations: Target dosing levels will be at or below 60 MME/day. Use of benzodiazepines** is not recommended. Urine Drug testing: Patients taking controlled substances will be required to provide a urine sample upon request. Do not void before coming to your medication management appointments. Hold emptying your bladder until you are admitted. The admitting nurse will inform you if a sample is required. Our practice reserves the right to call you at any time to provide a sample. Once receiving the call, you have 24 hours to comply with request. Not providing a sample upon request may result in termination of medication therapy.  Exceptions: There are only two exceptions to the rule of not receiving pain medications from other Healthcare Providers. Exception #1 (Emergencies): In the event of an emergency (i.e.: accident requiring emergency care), you are allowed to receive additional pain medication. However, you are responsible for: As soon as you are able, call our office [phone], at any time of the day or night, and leave a message stating your name, the date and nature of the emergency, and the name and dose of the medication prescribed. In the event that your call is answered by a member of our staff, make sure to document and save the date, time, and the name of the person that took your information.  Exception #2 (Planned Surgery): In the event that you are scheduled by another doctor or dentist to have any type of surgery or procedure, you are allowed (for a period no longer than 30 days), to receive additional pain medication, for the acute post-op pain. However, in this case, you are responsible for picking up a copy of our Post-op Pain Management for Surgeons handout, and giving it to your surgeon or dentist. This document is available at  our office, and does not require an appointment to obtain it. Simply go to our office during business hours (Monday-Thursday from 8:00 AM to 4:00 PM) (Friday 8:00 AM to 12:00 Noon) or if you have a scheduled appointment with us , prior to your surgery, and ask for it by name. In addition, you are responsible for: calling our office [phone], at any time of the day or night, and leaving a message stating your name, name of your surgeon, type of surgery, and date of procedure or surgery. Failure to comply with your responsibilities may result in termination of therapy involving the controlled substances.  Consequences:  Non-compliance with the above rules may result in permanent discontinuation of medication prescription therapy. All patients receiving any type of controlled substance is expected to comply with the above patient responsibilities. Not doing so may result in permanent discontinuation  of medication prescription therapy. Medication Agreement Violation. Following the above rules, including your responsibilities will help you in avoiding a Medication Agreement Violation (Breaking your Pain Medication Contract).  *Opioid medications include: morphine , codeine, oxycodone , oxymorphone, hydrocodone , hydromorphone, meperidine, tramadol, tapentadol, buprenorphine, fentanyl , methadone. **Benzodiazepine medications include: diazepam (Valium), alprazolam (Xanax), clonazepam (Klonopine), lorazepam (Ativan), clorazepate (Tranxene), chlordiazepoxide (Librium), estazolam (Prosom), oxazepam (Serax), temazepam (Restoril), triazolam (Halcion) (Last updated: 08/03/2023) ______________________________________________________________________     ______________________________________________________________________    Appointment Information  It is our goal and responsibility to provide the medical community with assistance in the evaluation and management of patients with chronic pain. Unfortunately  our resources are limited. Because we do not have an unlimited amount of time, or available appointments, we are required to closely monitor for unkept or cancelled appointments.  Patient's responsibilities: 1. Punctuality: Patients are required to be physically present in our office at least 15 minutes before their scheduled appointment. 2. Tardiness: Patients not physically present in our office at their scheduled appointment time will be rescheduled. 3. Plan ahead: Assume that you will encounter traffic and plan to arrive 30 minutes before your appointment. 4. Other appointments and responsibilities: Do not schedule other appointments immediately before or after your scheduled appointment.  5. Be prepared: Make a list of everything that you need to discuss with your provider so that you use your time efficiently. Once the provider leaves your room, he/she will not return to your room to discuss anything that you neglected to bring up during your allowed time. 6. No children or pets: Do not bring children or pets to your appointment. 7. Cancelling or rescheduling your appointment: Advanced notification (more than 24 hours in advance) is required. 8. No Show: Not calling to cancel an appointment and simply not showing up is unacceptable. This leads to loss of appointments that could have been used by a patient in need. (See below)  Corrective process for repeat offenders:  No Shows: Three (3) No Shows within a 12 month period will result in an automatic discharge from our practice. Rescheduling or cancelling with more than 24 hours notice will not be penalized and will not count against you. Tardiness: If you have to be rescheduled three (3) times due to late arrivals, it will be counted as one (1) No Show. Cancellation or reschedule: Three (3) cancellations or rescheduling where notice was given with less than 24 hours in advance, will be recorded as one (1) No Show.  Types of  appointments: New patient initial evaluation: These are evaluations only. Your initial patient questionnaire will be collected and entered into the system. A history of present illness will be taken. Prior lab work, imaging studies, and associated treatments will be reviewed. The provider may order appropriate diagnostic testing depending on their evaluation and review of available information. No treatments will be started on this visit. 2nd Follow-up visit: During this visit your provider will inform you of the results of the diagnostic tests ordered on the initial evaluation. Based on the providers assessment, treatment options will be offered, at which the patient will decide if he/she is interested in the alternatives. If interested, a treatment plan will be established and started. Procedure visits: Post-procedure evaluation visits: Evaluation visits MM New problems Flare-up evaluations Follow-up after diagnostic testing ______________________________________________________________________     "

## 2025-05-06 ENCOUNTER — Encounter: Admitting: Nurse Practitioner
